# Patient Record
Sex: Female | Born: 1960 | Race: Black or African American | Hispanic: Refuse to answer | Marital: Single
Health system: Midwestern US, Community
[De-identification: ages and names within clinical notes are randomized; demographics above are authoritative.]

## PROBLEM LIST (undated history)

## (undated) DIAGNOSIS — M25522 Pain in left elbow: Secondary | ICD-10-CM

## (undated) DIAGNOSIS — M47812 Spondylosis without myelopathy or radiculopathy, cervical region: Secondary | ICD-10-CM

## (undated) DIAGNOSIS — E559 Vitamin D deficiency, unspecified: Secondary | ICD-10-CM

## (undated) DIAGNOSIS — M542 Cervicalgia: Secondary | ICD-10-CM

## (undated) DIAGNOSIS — R0789 Other chest pain: Secondary | ICD-10-CM

## (undated) DIAGNOSIS — K219 Gastro-esophageal reflux disease without esophagitis: Secondary | ICD-10-CM

## (undated) DIAGNOSIS — Z9889 Other specified postprocedural states: Secondary | ICD-10-CM

## (undated) DIAGNOSIS — R0600 Dyspnea, unspecified: Secondary | ICD-10-CM

## (undated) DIAGNOSIS — R918 Other nonspecific abnormal finding of lung field: Secondary | ICD-10-CM

## (undated) DIAGNOSIS — K259 Gastric ulcer, unspecified as acute or chronic, without hemorrhage or perforation: Secondary | ICD-10-CM

## (undated) DIAGNOSIS — I341 Nonrheumatic mitral (valve) prolapse: Secondary | ICD-10-CM

## (undated) DIAGNOSIS — K449 Diaphragmatic hernia without obstruction or gangrene: Secondary | ICD-10-CM

## (undated) DIAGNOSIS — J45909 Unspecified asthma, uncomplicated: Secondary | ICD-10-CM

## (undated) DIAGNOSIS — M1990 Unspecified osteoarthritis, unspecified site: Secondary | ICD-10-CM

## (undated) DIAGNOSIS — I10 Essential (primary) hypertension: Secondary | ICD-10-CM

## (undated) DIAGNOSIS — Z8719 Personal history of other diseases of the digestive system: Secondary | ICD-10-CM

## (undated) HISTORY — DX: Personal history of other diseases of the digestive system: Z87.19

## (undated) HISTORY — DX: Other specified postprocedural states: Z98.890

## (undated) HISTORY — DX: Gastric ulcer, unspecified as acute or chronic, without hemorrhage or perforation: K25.9

## (undated) HISTORY — DX: Unspecified osteoarthritis, unspecified site: M19.90

## (undated) HISTORY — PX: LUNG BIOPSY: SHX232

## (undated) HISTORY — PX: BREAST CYST ASPIRATION: SHX578

## (undated) HISTORY — DX: Gastro-esophageal reflux disease without esophagitis: K21.9

## (undated) HISTORY — DX: Diaphragmatic hernia without obstruction or gangrene: K44.9

## (undated) HISTORY — PX: KNEE SURGERY: SHX244

---

## 1980-01-05 HISTORY — PX: LAPAROSCOPIC TUBAL LIGATION: SUR803

## 1983-01-05 HISTORY — PX: OTHER SURGICAL HISTORY: SHX169

## 1988-01-05 HISTORY — PX: ESOPHAGOGASTRODUODENOSCOPY: SHX1529

## 2000-01-05 HISTORY — PX: LAPAROSCOPIC SUPRACERVICAL HYSTERECTOMY: SUR797

## 2000-01-05 HISTORY — PX: ABDOMINAL HYSTERECTOMY: SHX81

## 2007-01-05 HISTORY — PX: ANTERIOR FUSION CERVICAL SPINE: SUR626

## 2008-01-05 HISTORY — PX: KNEE ARTHROSCOPY: SUR90

## 2009-01-04 HISTORY — PX: SHOULDER SURGERY: SHX246

## 2011-01-05 HISTORY — PX: COLONOSCOPY: SHX174

## 2013-10-23 ENCOUNTER — Inpatient Hospital Stay: Admit: 2013-10-23 | Primary: Family Medicine

## 2013-10-23 DIAGNOSIS — Z0289 Encounter for other administrative examinations: Secondary | ICD-10-CM

## 2013-10-23 LAB — RUBELLA AB, IGG: Rubella IgG, QL: IMMUNE

## 2013-10-25 LAB — HEP B SURFACE AB: Hep B surface Ab Interp.: DETECTED — AB

## 2013-10-25 LAB — MUMPS AB, IGG: Mumps Abs, IgG: 71 AU/mL (ref >10.9)

## 2013-10-25 LAB — RUBEOLA AB, IGG: Rubeola Ab, IgG: 161 AU/mL (ref >29.9)

## 2013-10-25 LAB — VZV AB, IGG: V. zoster, IgG: 2877 index (ref >165)

## 2013-10-25 LAB — HEPATITIS B SURFACE ANTIBODY: Hep B S Ab Interp: DETECTED — AB

## 2013-11-12 DIAGNOSIS — J4520 Mild intermittent asthma, uncomplicated: Secondary | ICD-10-CM | POA: Insufficient documentation

## 2013-11-13 LAB — AMB EXT CREATININE: Creatinine, External: 1.03

## 2013-12-26 ENCOUNTER — Encounter

## 2013-12-26 ENCOUNTER — Inpatient Hospital Stay: Admit: 2013-12-26 | Payer: PRIVATE HEALTH INSURANCE | Primary: Family Medicine

## 2013-12-26 DIAGNOSIS — M47812 Spondylosis without myelopathy or radiculopathy, cervical region: Secondary | ICD-10-CM

## 2013-12-26 LAB — SED RATE (ESR): Sed rate, automated: 19 mm/hr (ref 0–30)

## 2014-02-07 ENCOUNTER — Encounter: Attending: Internal Medicine | Primary: Family Medicine

## 2014-02-20 ENCOUNTER — Ambulatory Visit
Admit: 2014-02-20 | Discharge: 2014-02-20 | Payer: PRIVATE HEALTH INSURANCE | Attending: Internal Medicine | Primary: Family Medicine

## 2014-02-20 ENCOUNTER — Ambulatory Visit: Admit: 2014-02-20 | Payer: PRIVATE HEALTH INSURANCE | Attending: Internal Medicine | Primary: Family Medicine

## 2014-02-20 DIAGNOSIS — R635 Abnormal weight gain: Secondary | ICD-10-CM

## 2014-02-20 MED ORDER — ACETAMINOPHEN-CODEINE 300 MG-60 MG TAB
300-60 mg | ORAL_TABLET | ORAL | Status: DC | PRN
Start: 2014-02-20 — End: 2014-02-21

## 2014-02-20 MED ORDER — GABAPENTIN ENACARBIL ER 300 MG TABLET,EXTENDED RELEASE
300 mg | ORAL_TABLET | Freq: Every day | ORAL | Status: DC
Start: 2014-02-20 — End: 2014-02-28

## 2014-02-20 MED ORDER — DIAZEPAM 10 MG TAB
10 mg | ORAL_TABLET | Freq: Four times a day (QID) | ORAL | Status: DC | PRN
Start: 2014-02-20 — End: 2014-03-07

## 2014-02-20 NOTE — Progress Notes (Unsigned)
Kelsey Bradley is a 54 y.o. female presents to office to establish care.

## 2014-02-20 NOTE — Progress Notes (Unsigned)
Patient switched to another provider's schedule.

## 2014-02-20 NOTE — Progress Notes (Signed)
Marlowe AltVanessa Lindseth is a 54 y.o. female presents in office to establish care.

## 2014-02-20 NOTE — Progress Notes (Signed)
HPI  Patient presents to establish care and to discuss a number of concerns.    Neck pain- pt reports a week history of worsening neck pains.  She gives a history of Torticollis in the past and is concerned that this may be recurring.  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  Patient reports a radiating pains to her upper extremities (b/l) with associated numbness and tingling in her fingers.  She describes her pain as debilitating.    Concern about her thyroid- pt reports excess weight gain of over 20 pounds within 5 months even with working out and dieting.  Patient also reports extensive hair loss and thinning to the point that she is now wearing a wig.  She reports little to no energy and dry skin.  She denies any difficulty swallowing but does note a change in her voice.  Patient requesting a repeat of her thyroid labs to include TPO antibodies.  Her last thyroid tests (TSH and free T4) a few months ago was normal.    Review of Systems:  ROS:  History obtained from the patient  ?? General: + abnormal weight gain  ?? HEENT: + hoarse voice  ?? Respiratory: no cough, shortness of breath, or wheezing  ?? Cardiovascular: no chest pain, palpitations, or dyspnea on exertion  ?? Gastrointestinal: no abdominal pain, N/V, change in bowel habits, or black or bloody stools  ?? Musculoskeletal: + neck pain  ?? Neurological: +numbness, tingling of the upper extremities  ?? Psychological: negative for - anxiety, depression, sleep disturbances, suicidal or homicidal ideations  ?? Skin- dry skin, hair loss    Past Medical History   Diagnosis Date   ??? Spondylosis    ??? Torticollis    ??? Chronic neck pain        Family History   Problem Relation Age of Onset   ??? Diabetes Mother    ??? Hypertension Mother    ??? Cancer Father      prostate   ??? Hypertension Father        Past Surgical History   Procedure Laterality Date    ??? Hx total abdominal hysterectomy     ??? Hx knee arthroscopy       x4   ??? Hx rotator cuff repair       x2   ??? Hx cervical fusion         History     Social History   ??? Marital Status: SINGLE     Spouse Name: N/A   ??? Number of Children: N/A   ??? Years of Education: N/A     Occupational History   ??? Not on file.     Social History Main Topics   ??? Smoking status: Former Smoker     Quit date: 02/21/1983   ??? Smokeless tobacco: Never Used   ??? Alcohol Use: No   ??? Drug Use: No   ??? Sexual Activity: Not Currently     Other Topics Concern   ??? Not on file     Social History Narrative       Current Outpatient Prescriptions   Medication Sig Dispense Refill   ??? estradiol (ESTRACE) 2 mg tablet Take 2 mg by mouth daily.     ??? estradiol (VAGIFEM) 10 mcg tab vaginal tablet Insert 10 mcg into vagina. Three times weekly.     ??? multivitamin (ONE A DAY)  tablet Take 1 Tab by mouth daily.     ??? BIOTIN PO Take 5,000 mg by mouth daily.     ??? GLUCOSAMINE-CONDROITIN-HRB#182 PO Take 1 Tab by mouth daily.     ??? gabapentin enacarbil (HORIZANT) 300 mg TbER Take 300 mg by mouth daily. 30 Tab 0   ??? diazepam (VALIUM) 10 mg tablet Take 1 Tab by mouth every six (6) hours as needed for Anxiety. Max Daily Amount: 40 mg. 5 Tab 0   ??? oxyCODONE-acetaminophen (PERCOCET) 5-325 mg per tablet Take 1 Tab by mouth every four (4) hours as needed for Pain. Max Daily Amount: 6 Tabs. 30 Tab 0       Allergies   Allergen Reactions   ??? Latex Rash   ??? Shellfish Derived Anaphylaxis   ??? Anaprox [Naproxen Sodium] Other (comments)     GI Bleeding   ??? Celebrex [Celecoxib] Other (comments)     GI Bleeding   ??? Doxycycline Unknown (comments)   ??? Motrin [Ibuprofen] Other (comments)     GI Bleeding   ??? Naprosyn [Naproxen] Other (comments)     GI Bleeding   ??? Neurontin [Gabapentin] Unknown (comments)   ??? Norco [Hydrocodone-Acetaminophen] Other (comments)     Hair Sheds   ??? Nsaids (Non-Steroidal Anti-Inflammatory Drug) Other (comments)     Gastric bleed    ??? Singulair [Montelukast] Other (comments)     Caused Fainting   ??? Strawberry Hives and Nausea and Vomiting   ??? Sulfamethoxazole-Trimethoprim Other (comments)     Increases pain   ??? Tramadol Other (comments)     confusion       Physical Examination:   BP 124/82 mmHg   Pulse 65   Temp(Src) 96.7 ??F (35.9 ??C) (Oral)   Resp 12   Ht 5' 1.25" (1.556 m)   Wt 169 lb 12.8 oz (77.021 kg)   BMI 31.81 kg/m2   SpO2 98%    General appearance - alert, well appearing, and in no distress  Mental status - alert, oriented to person, place, and time  PULM: clear to auscultation, no wheezes, rales or rhonchi, symmetric air entry  CVS - normal rate, regular rhythm, normal S1, S2, no murmurs, rubs, clicks or gallops  Neurological - alert, oriented, normal speech, no focal findings or movement disorder noted, cranial nerves II through XII intact  Musculoskeletal - there is tenderness to palpation of the cervical area, decrease grip strength on the right.  +5/5 strength in the lower extremities b/l.  Of note her left upper extremity is larger than her right.     Imaging:    MRI cervical 11/28/13.  Impression:  1.  Evidence of ACDF with likely solid fusion C6-C7.  No recurrent or residual significant central or foraminal stenosis at this level.  2.  Mild degenerative change at the level above the fusion at C5-C6.  No significant central or foraminal stenosis at this level.  3.  Asymmetric more prominent on the right facet arthropathy at C4-C5 with an asymmetrically prominent joint effusion on the right in the facet joint at this level.  Mild adjacent marrow but no prominent soft tissue changes.  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  4.  Prespinal/retropharyngeal edema/fluid, presumably reactive and nonspecific.     Result Narrative   Cervical spine MR:    Indication: Severe neck pain.  History of prior surgery.    Procedure: Sagittal spin echo T1, T2 TSE and TSE inversion recovery (STIR)  images of the  cervical spine.  Axial 3D TSE T2 volume SPACE and  T2 star gradient echo thin section scans were also performed.  Comparison previous plain film 11/28/2013.    Findings: There is straightening of the cervical lordosis.  Anterior listhesis of C3 on C4 and C4 on C5 is noted .  Evidence of ACDF C6-C7.  Apparent likely solid fusion.  No destructive vertebral lesions.  There is asymmetrically prominent facet arthropathy on the right at the C4-C5 level with a somewhat prominent joint effusion there is no prominent adjacent marrow changes.  There is mild nonspecific retroperitoneal/prespinal fluid/edema, likely reactive, asymmetric more prominent on the right.  No cord lesion.  The craniocervical junction is normal.    Axial scans show:    C2/C3: Normal except for mild degenerative change.    C3/C4: Diffuse spondylosis and right greater than left facet arthropathy.  Moderate right-sided foraminal stenosis.  Mild central stenosis.  Prespinal/retropharyngeal fluid/edema.    C4/C5: Diffuse spondylosis/bulging disc with left greater than right facet arthropathy.  Mild central stenosis with very slight cord flattening.  No cord signal change.  Thecal sac measures 9.5 mm.  Relatively severe left and high-grade moderate-severe right-sided foraminal stenosis.  Facet arthropathy with marrow edema associated with the asymmetrically more prominent on the right facet degenerative change at this level.    C5/C6: Mild spondylosis.  No significant central or foraminal stenosis.    C6/C7: Surgical level.  No recurrent or residual central or foraminal stenosis.    C7/T1: Normal except for mild degenerative change.       Asessment/ Plan    1. Abnormal weight gain   TSH, 3RD GENERATION; Future  - T4, FREE; Future  - CORTISOL; Future  - THYROID PEROXIDASE (TPO) AB; Future  - T3, FREE; Future    2. Cervical pain (neck)  - MRI showing mod to severe foraminal stenosis with multiple areas of arthropathy  - REFERRAL TO NEUROSURGERY   - gabapentin enacarbil (HORIZANT) 300 mg TbER; Take 300 mg by mouth daily.  Dispense: 30 Tab; Refill: 0  - diazepam (VALIUM) 10 mg tablet for muscle relaxant as the patient does have muscle spasms  - will await until clearance from neurosurgery for Physical therapy  - tylenol #4 for pain control    3. Hair loss- unclear etiology  - ANTINUCLEAR ANTIBODIES, IFA; Future    4. Fatigue, unspecified type  - ANTINUCLEAR ANTIBODIES, IFA; Future  - METABOLIC PANEL, COMPREHENSIVE; Future  - VITAMIN D, 25 HYDROXY; Future  - THYROID PEROXIDASE (TPO) AB; Future  - T3, FREE; Future      Plan of care has been discussed with the patient and she is in agreement  Dyann Ruddle, MD

## 2014-02-21 MED ORDER — OXYCODONE-ACETAMINOPHEN 5 MG-325 MG TAB
5-325 mg | ORAL_TABLET | ORAL | Status: DC | PRN
Start: 2014-02-21 — End: 2014-03-07

## 2014-02-21 NOTE — Telephone Encounter (Signed)
Dr. Aneta [facility]insPhillip,      [facility] the gabapentin is non formulary for medicare part-d and will not be covered even with a PA.       Please advise,  Dixon BoosMary Dayanghirang LPN

## 2014-02-21 NOTE — Telephone Encounter (Addendum)
Received incoming call from patient reporting that she had been itching since she took the Tylenol #4 and she reports her appetite is "off the roof". PT request Percocet instead. Also, the Horizant pt said cost $400 and she cannot afford it. She spoke with her Spine specialist and they are sending us pt's notes.

## 2014-02-22 DIAGNOSIS — M542 Cervicalgia: Secondary | ICD-10-CM | POA: Insufficient documentation

## 2014-02-22 DIAGNOSIS — L659 Nonscarring hair loss, unspecified: Secondary | ICD-10-CM | POA: Insufficient documentation

## 2014-02-22 DIAGNOSIS — M62838 Other muscle spasm: Secondary | ICD-10-CM | POA: Insufficient documentation

## 2014-02-22 DIAGNOSIS — R5383 Other fatigue: Secondary | ICD-10-CM | POA: Insufficient documentation

## 2014-02-22 DIAGNOSIS — R635 Abnormal weight gain: Secondary | ICD-10-CM | POA: Insufficient documentation

## 2014-02-22 NOTE — Patient Instructions (Signed)
Neck Pain: Care Instructions  Your Care Instructions  You can have neck pain anywhere from the bottom of your head to the top of your shoulders. It can spread to the upper back or arms. Injuries, painting a ceiling, sleeping with your neck twisted, staying in one position for too long, and many other activities can cause neck pain.  Most neck pain gets better with home care. Your doctor may recommend medicine to relieve pain or relax your muscles. He or she may suggest exercise and physical therapy to increase flexibility and relieve stress. You may need to wear a special (cervical) collar to support your neck for a day or two.  Follow-up care is a key part of your treatment and safety. Be sure to make and go to all appointments, and call your doctor if you are having problems. It???s also a good idea to know your test results and keep a list of the medicines you take.  How can you care for yourself at home?  ?? Try using a heating pad on a low or medium setting for 15 to 20 minutes every 2 or 3 hours. Try a warm shower in place of one session with the heating pad.  ?? You can also try an ice pack for 10 to 15 minutes every 2 to 3 hours. Put a thin cloth between the ice and your skin.  ?? Take pain medicines exactly as directed.  ?? If the doctor gave you a prescription medicine for pain, take it as prescribed.  ?? If you are not taking a prescription pain medicine, ask your doctor if you can take an over-the-counter medicine.  ?? If your doctor recommends a cervical collar, wear it exactly as directed.  ?? Avoid things that might increase swelling, such as hot showers, hot tubs, or hot packs, for the first 2 days after an injury.  When should you call for help?  Call your doctor now or seek immediate medical care if:  ?? You have new or worsening numbness in your arms, buttocks or legs.  ?? You have new or worsening weakness in your arms or legs. (This could make it hard to stand up.)   ?? You lose control of your bladder or bowels.  Watch closely for changes in your health, and be sure to contact your doctor if:  ?? Your neck pain is getting worse.  ?? You are not getting better after 1 week.  ?? You do not get better as expected.   Where can you learn more?   Go to http://www.healthwise.net/[facility]  Enter V723 in the search box to learn more about "Neck Pain: Care Instructions."   ?? 2006-2015 Healthwise[employer]. Care instructions adapted under license by [employer] (which disclaims liability or warranty for this information). This care instruction is for use with your licensed healthcare professional. If you have questions about a medical condition or this instruction, always ask your healthcare professional. Healthwise, Incorporated disclaims any warranty or liability for your use of this information.  Content Version: 10.7.482551; Current as of: November 17, 2012

## 2014-02-23 ENCOUNTER — Inpatient Hospital Stay: Admit: 2014-02-23 | Payer: PRIVATE HEALTH INSURANCE | Primary: Family Medicine

## 2014-02-23 DIAGNOSIS — R635 Abnormal weight gain: Secondary | ICD-10-CM

## 2014-02-23 LAB — METABOLIC PANEL, COMPREHENSIVE
A-G Ratio: 1.3 (ref 0.8–1.7)
ALT (SGPT): 26 U/L (ref 13–56)
AST (SGOT): 21 U/L (ref 15–37)
Albumin: 4.1 g/dL (ref 3.4–5.0)
Alk. phosphatase: 84 U/L (ref 45–117)
Anion gap: 5 mmol/L (ref 3.0–18)
BUN/Creatinine ratio: 12 (ref 12–20)
BUN: 9 MG/DL (ref 7.0–18)
Bilirubin, total: 0.5 MG/DL (ref 0.2–1.0)
CO2: 28 mmol/L (ref 21–32)
Calcium: 9.1 MG/DL (ref 8.5–10.1)
Chloride: 105 mmol/L (ref 100–108)
Creatinine: 0.78 MG/DL (ref 0.6–1.3)
GFR est AA: >60 mL/min/{1.73_m2} (ref >60)
GFR est non-AA: >60 mL/min/{1.73_m2} (ref >60)
Globulin: 3.2 g/dL (ref 2.0–4.0)
Glucose: 79 mg/dL (ref 74–99)
Potassium: 4.9 mmol/L (ref 3.5–5.5)
Protein, total: 7.3 g/dL (ref 6.4–8.2)
Sodium: 138 mmol/L (ref 136–145)

## 2014-02-23 LAB — T4, FREE: T4, Free: 1 NG/DL (ref 0.7–1.5)

## 2014-02-23 LAB — TSH 3RD GENERATION: TSH: 1.02 u[IU]/mL (ref 0.36–3.74)

## 2014-02-23 LAB — SED RATE (ESR): Sed rate, automated: 9 mm/hr (ref 0–30)

## 2014-02-23 LAB — T3, FREE: Triiodothyronine (T3), free: 2.2 PG/ML — ABNORMAL LOW (ref 2.3–4.2)

## 2014-02-24 LAB — CORTISOL: Cortisol, random: 10 ug/dL (ref 3.09–22.40)

## 2014-02-24 LAB — VITAMIN D, 25 HYDROXY: Vitamin D 25-Hydroxy: 14.9 ng/mL — ABNORMAL LOW (ref 30–100)

## 2014-02-26 LAB — THYROID PEROXIDASE (TPO) AB: Thyroid peroxidase Ab: 12 IU/mL (ref 0–34)

## 2014-02-26 LAB — ANTINUCLEAR ANTIBODIES, IFA: Antinuclear Abs, IFA: NEGATIVE

## 2014-02-27 ENCOUNTER — Ambulatory Visit
Admit: 2014-02-27 | Discharge: 2014-02-27 | Payer: PRIVATE HEALTH INSURANCE | Attending: Internal Medicine | Primary: Family Medicine

## 2014-02-27 DIAGNOSIS — L659 Nonscarring hair loss, unspecified: Secondary | ICD-10-CM

## 2014-02-27 MED ORDER — ERGOCALCIFEROL (VITAMIN D2) 50,000 UNIT CAP
1250 mcg (50,000 unit) | ORAL_CAPSULE | ORAL | Status: DC
Start: 2014-02-27 — End: 2014-05-11

## 2014-02-27 NOTE — Progress Notes (Signed)
Marlowe AltVanessa Bradley is a 54 y.o. female presents to office for follow up lab results.     1. Have you been to the ER, urgent care clinic or hospitalized since your last visit? No  2. [facility] other [facility] since your last visit? No

## 2014-02-27 NOTE — Progress Notes (Signed)
HPI  Patient presents to establish care and to discuss a number of concerns.    Neck pain- percocets she reports decreases her pain to a 4-5/10.  She reports some drowsiness from the meds.      Swollen lips- pt reports a sudden onset of her lips and facial swelling.  She reports that it resolved after 3 days with the use of benadryl.  Patient reported that this first began in December and that it has occurred at least two times since then.       Review of Systems:  ROS:  History obtained from the patient  ?? General: + abnormal weight gain  ?? HEENT: + swollen lips  ?? Respiratory: no cough, shortness of breath, or wheezing  ?? Cardiovascular: no chest pain, palpitations, or dyspnea on exertion  ?? Gastrointestinal: no abdominal pain, N/V, change in bowel habits, or black or bloody stools  ?? Musculoskeletal: + neck pain  ?? Neurological: +numbness, tingling of the upper extremities  ?? Psychological: negative for - anxiety, depression, sleep disturbances, suicidal or homicidal ideations  ?? Skin- dry skin, hair loss    Past Medical History   Diagnosis Date   ??? Spondylosis    ??? Torticollis    ??? Chronic neck pain        Family History   Problem Relation Age of Onset   ??? Diabetes Mother    ??? Hypertension Mother    ??? Cancer Father      prostate   ??? Hypertension Father        Past Surgical History   Procedure Laterality Date   ??? Hx total abdominal hysterectomy     ??? Hx knee arthroscopy       x4   ??? Hx rotator cuff repair       x2   ??? Hx cervical fusion         History     Social History   ??? Marital Status: SINGLE     Spouse Name: N/A   ??? Number of Children: N/A   ??? Years of Education: N/A     Occupational History   ??? Not on file.     Social History Main Topics   ??? Smoking status: Former Smoker     Quit date: 02/21/1983   ??? Smokeless tobacco: Never Used   ??? Alcohol Use: No   ??? Drug Use: No   ??? Sexual Activity: Not Currently     Other Topics Concern   ??? Not on file     Social History Narrative        Current Outpatient Prescriptions   Medication Sig Dispense Refill   ??? ergocalciferol (VITAMIN D2) 50,000 unit capsule Take 1 Cap by mouth every seven (7) days. 6 Cap 1   ??? oxyCODONE-acetaminophen (PERCOCET) 5-325 mg per tablet Take 1 Tab by mouth every four (4) hours as needed for Pain. Max Daily Amount: 6 Tabs. 30 Tab 0   ??? estradiol (ESTRACE) 2 mg tablet Take 2 mg by mouth daily.     ??? estradiol (VAGIFEM) 10 mcg tab vaginal tablet Insert 10 mcg into vagina. Three times weekly.     ??? multivitamin (ONE A DAY) tablet Take 1 Tab by mouth daily.     ??? BIOTIN PO Take 5,000 mg by mouth daily.     ??? GLUCOSAMINE-CONDROITIN-HRB#182 PO Take 1 Tab by mouth daily.     ??? diazepam (VALIUM) 10 mg tablet Take 1 Tab by mouth every six (  6) hours as needed for Anxiety. Max Daily Amount: 40 mg. 5 Tab 0       Allergies   Allergen Reactions   ??? Latex Rash   ??? Shellfish Derived Anaphylaxis   ??? Anaprox [Naproxen Sodium] Other (comments)     GI Bleeding   ??? Celebrex [Celecoxib] Other (comments)     GI Bleeding   ??? Doxycycline Unknown (comments)   ??? Motrin [Ibuprofen] Other (comments)     GI Bleeding   ??? Naprosyn [Naproxen] Other (comments)     GI Bleeding   ??? Neurontin [Gabapentin] Unknown (comments)   ??? Norco [Hydrocodone-Acetaminophen] Other (comments)     Hair Sheds   ??? Nsaids (Non-Steroidal Anti-Inflammatory Drug) Other (comments)     Gastric bleed   ??? Singulair [Montelukast] Other (comments)     Caused Fainting   ??? Strawberry Hives and Nausea and Vomiting   ??? Sulfamethoxazole-Trimethoprim Other (comments)     Increases pain   ??? Tramadol Other (comments)     confusion       Physical Examination:   BP 140/80 mmHg   Pulse 70   Temp(Src) 96.6 ??F (35.9 ??C) (Oral)   Resp 12   Ht 5' 1.25" (1.556 m)   Wt 167 lb 9.6 oz (76.023 kg)   BMI 31.40 kg/m2   SpO2 99%    General appearance - alert, well appearing, and in no distress  Mental status - alert, oriented to person, place, and time   PULM: clear to auscultation, no wheezes, rales or rhonchi, symmetric air entry  CVS - normal rate, regular rhythm, normal S1, S2, no murmurs, rubs, clicks or gallops  Musculoskeletal - there is tenderness to palpation of the cervical area, decrease grip strength on the right.  +5/5 strength in the lower extremities b/l.        Asessment/ Plan  1. Hair loss, abnormal weight gain and other symptoms  - REFERRAL TO ENDOCRINOLOGY    2. Vitamin D deficiency  - ergocalciferol (VITAMIN D2) 50,000 unit capsule; Take 1 Cap by mouth every seven (7) days.  Dispense: 6 Cap; Refill: 1    3. Chronic neck and back pains- improved control of pain  - awaiting neurosurgery appointment  - continue current plan of care      Plan of care has been discussed with the patient and she is in agreement  Dyann Ruddlearik A Docie Abramovich, MD

## 2014-03-04 ENCOUNTER — Encounter

## 2014-03-04 NOTE — Telephone Encounter (Signed)
Pt calling to get refill on:  oxyCODONE-acetaminophen (PERCOCET) 5-325 mg per tablet  diazepam (VALIUM) 10 mg tablet   Pt is out of medication.   Pt is aware of 72 hr time frame and that she will need to pick up meds once they are approved.

## 2014-03-04 NOTE — Telephone Encounter (Signed)
Dr. Aneta MinsPhillip, please advise on below, does pt need an appt? Thank you.

## 2014-03-06 NOTE — Telephone Encounter (Signed)
Pt calling again, please advise

## 2014-03-07 NOTE — Telephone Encounter (Signed)
Dr Aneta MinsPhillip,    Please advise.   Requested Prescriptions     Pending Prescriptions Disp Refills   ??? diazepam (VALIUM) 10 mg tablet 5 Tab 0     Sig: Take 1 Tab by mouth every six (6) hours as needed for Anxiety. Max Daily Amount: 40 mg.   ??? oxyCODONE-acetaminophen (PERCOCET) 5-325 mg per tablet 30 Tab 0     Sig: Take 1 Tab by mouth every four (4) hours as needed for Pain. Max Daily Amount: 6 Tabs.         Thanks

## 2014-03-08 MED ORDER — DIAZEPAM 10 MG TAB
10 mg | ORAL_TABLET | Freq: Four times a day (QID) | ORAL | Status: DC | PRN
Start: 2014-03-08 — End: 2014-04-11

## 2014-03-08 MED ORDER — OXYCODONE-ACETAMINOPHEN 5 MG-325 MG TAB
5-325 mg | ORAL_TABLET | ORAL | Status: DC | PRN
Start: 2014-03-08 — End: 2014-04-11

## 2014-03-21 NOTE — Telephone Encounter (Signed)
Pt is requesting an order for mammogram. Pt is having pain in her left breast and under her left arm. Please advise.

## 2014-03-22 NOTE — Telephone Encounter (Signed)
Dr. Phillip,     Please be advised.

## 2014-03-23 NOTE — Telephone Encounter (Signed)
[facility] the patient [facility]  Patient aware that she will have to make an appointment concerning her breast pain.  She reports that her insurance does not kick in until April.  I have asked her to make an appointment as soon as her Insurance becomes active.  Patient has indicated that she does not have the money for a mammogram right now.

## 2014-03-23 NOTE — Telephone Encounter (Signed)
Pt calling to return Dr. Berton LanPhillip's call. Please advise.

## 2014-03-28 NOTE — Telephone Encounter (Signed)
Patient calling in again regarding mammogram order. Reiterated to her that Dr. Aneta MinsPhillip wants her to come in to be evaluated to know what type of mammogram would be appropriate to find cause of her pain. Patient verbalized understanding and an appointment was made for her for 04/09/2014 at 445pm.   Closing encounter.

## 2014-04-08 ENCOUNTER — Encounter: Attending: Internal Medicine | Primary: Family Medicine

## 2014-04-09 ENCOUNTER — Ambulatory Visit
Admit: 2014-04-09 | Discharge: 2014-04-09 | Payer: PRIVATE HEALTH INSURANCE | Attending: Family Medicine | Primary: Family Medicine

## 2014-04-09 ENCOUNTER — Encounter: Attending: Internal Medicine | Primary: Family Medicine

## 2014-04-09 DIAGNOSIS — M542 Cervicalgia: Secondary | ICD-10-CM

## 2014-04-09 NOTE — Progress Notes (Signed)
Marlowe AltVanessa Bradley is a 54 y.o. female here today to discuss bilateral breast pain. Patient stated that her left breast hurts worse than the right and that her left armpit is beginning to hurt as well.

## 2014-04-09 NOTE — Progress Notes (Signed)
Kelsey Bradley is a 54 y.o. African American female and presents with acute bilateral breast pain left greater than right   Especially around the lower end of the areolas. She denies any palpable masses on both sides. She has not had a mammogram in 4 years.  She does  See a GYN provider.     Subjective:    Additional Concerns: none    Patient Active Problem List    Diagnosis Date Noted   ??? Abnormal weight gain 02/22/2014   ??? Hair loss 02/22/2014   ??? Fatigue 02/22/2014   ??? Cervical pain (neck) 02/22/2014   ??? Muscle spasm 02/22/2014     Current Outpatient Prescriptions   Medication Sig Dispense Refill   ??? diazepam (VALIUM) 10 mg tablet Take 1 Tab by mouth every six (6) hours as needed for Anxiety. Max Daily Amount: 40 mg. 5 Tab 0   ??? oxyCODONE-acetaminophen (PERCOCET) 5-325 mg per tablet Take 1 Tab by mouth every four (4) hours as needed for Pain. Max Daily Amount: 6 Tabs. 30 Tab 0   ??? ergocalciferol (VITAMIN D2) 50,000 unit capsule Take 1 Cap by mouth every seven (7) days. 6 Cap 1   ??? estradiol (ESTRACE) 2 mg tablet Take 2 mg by mouth daily.     ??? estradiol (VAGIFEM) 10 mcg tab vaginal tablet Insert 10 mcg into vagina. Three times weekly.     ??? multivitamin (ONE A DAY) tablet Take 1 Tab by mouth daily.     ??? BIOTIN PO Take 5,000 mg by mouth daily.     ??? GLUCOSAMINE-CONDROITIN-HRB#182 PO Take 1 Tab by mouth daily.       Allergies   Allergen Reactions   ??? Latex Rash   ??? Shellfish Derived Anaphylaxis   ??? Anaprox [Naproxen Sodium] Other (comments)     GI Bleeding   ??? Celebrex [Celecoxib] Other (comments)     GI Bleeding   ??? Doxycycline Unknown (comments)   ??? Motrin [Ibuprofen] Other (comments)     GI Bleeding   ??? Naprosyn [Naproxen] Other (comments)     GI Bleeding   ??? Neurontin [Gabapentin] Unknown (comments)   ??? Norco [Hydrocodone-Acetaminophen] Other (comments)     Hair Sheds   ??? Nsaids (Non-Steroidal Anti-Inflammatory Drug) Other (comments)     Gastric bleed   ??? Singulair [Montelukast] Other (comments)      Caused Fainting   ??? Strawberry Hives and Nausea and Vomiting   ??? Sulfamethoxazole-Trimethoprim Other (comments)     Increases pain   ??? Tramadol Other (comments)     confusion     Past Medical History   Diagnosis Date   ??? Spondylosis    ??? Torticollis    ??? Chronic neck pain      Past Surgical History   Procedure Laterality Date   ??? Hx total abdominal hysterectomy     ??? Hx knee arthroscopy       x4   ??? Hx rotator cuff repair       x2   ??? Hx cervical fusion       Family History   Problem Relation Age of Onset   ??? Diabetes Mother    ??? Hypertension Mother    ??? Cancer Father      prostate   ??? Hypertension Father      History   Substance Use Topics   ??? Smoking status: Former Smoker     Quit date: 02/21/1983   ??? Smokeless tobacco: Never Used   ???  Alcohol Use: No     ROS     General: negative for - chills, fatigue, fever, weight change  Resp: negative for - cough, shortness of breath or wheezing  CV: negative for - chest pain, edema or palpitations  Breast - left breast slightly appears larger than other. Palpable pain on palpation at the 5-6 oclock position of the areola  But no definite palpable mass. Whatever is palpable felt like dense breast tissue or fat tissue.     Objective:    PE    Alert, well appearing, and in no distress, oriented to person, place, and time and overweight  Mental status - alert, oriented to person, place, and time, normal mood, behavior, speech, dress, motor activity, and thought processes  Chest - clear to auscultation, no wheezes, rales or rhonchi, symmetric air entry  Heart - normal rate, regular rhythm, normal S1, S2, no murmurs, rubs, clicks or gallops  Breasts - right breast normal without mass, skin or nipple changes or axillary nodes, left breast normal without mass, skin or nipple changes or axillary nodes  But has tender areas at the 5-6 oclock position just beneath both areolas. No nipple discharge on squeezing bilateral.     [facility] Outpatient Visit on 02/23/2014    Component Date Value Ref Range Status   ??? TSH 02/23/2014 1.02  0.36 - 3.74 uIU/mL Final   ??? T4, Free 02/23/2014 1.0  0.7 - 1.5 NG/DL Final   ??? Sed rate, automated 02/23/2014 9  0 - 30 mm/hr Final   ??? Sodium 02/23/2014 138  136 - 145 mmol/L Final   ??? Potassium 02/23/2014 4.9  3.5 - 5.5 mmol/L Final   ??? Chloride 02/23/2014 105  100 - 108 mmol/L Final   ??? CO2 02/23/2014 28  21 - 32 mmol/L Final   ??? Anion gap 02/23/2014 5  3.0 - 18 mmol/L Final   ??? Glucose 02/23/2014 79  74 - 99 mg/dL Final   ??? BUN 02/23/2014 9  7.0 - 18 MG/DL Final   ??? Creatinine 02/23/2014 0.78  0.6 - 1.3 MG/DL Final   ??? BUN/Creatinine ratio 02/23/2014 12  12 - 20   Final   ??? GFR est AA 02/23/2014 >60  >60 ml/min/1.11m Final   ??? GFR est non-AA 02/23/2014 >60  >60 ml/min/1.751mFinal    Comment: (NOTE)  Estimated GFR is calculated using the Modification of Diet in Renal   Disease (MDRD) Study equation, reported for both African Americans   (GFRAA) and non-African Americans (GFRNA), and normalized to 1.7319m body surface area. The physician must decide which value applies to   the patient. The MDRD study equation should only be used in   individuals age 28 63 older. It has not been validated for the   following: pregnant women, patients with serious comorbid conditions,   or on certain medications, or persons with extremes of body size,   muscle mass, or nutritional status.     ??? Calcium 02/23/2014 9.1  8.5 - 10.1 MG/DL Final   ??? Bilirubin, total 02/23/2014 0.5  0.2 - 1.0 MG/DL Final   ??? ALT 02/23/2014 26  13 - 56 U/L Final   ??? AST 02/23/2014 21  15 - 37 U/L Final   ??? Alk. phosphatase 02/23/2014 84  45 - 117 U/L Final   ??? Protein, total 02/23/2014 7.3  6.4 - 8.2 g/dL Final   ??? Albumin 02/23/2014 4.1  3.4 - 5.0 g/dL Final   ???  Globulin 02/23/2014 3.2  2.0 - 4.0 g/dL Final   ??? A-G Ratio 02/23/2014 1.3  0.8 - 1.7   Final   ??? Vitamin D 25-Hydroxy 02/23/2014 14.9* 30 - 100 ng/mL Final    Comment: New method in use, please reestablish patient baseline   (NOTE)  Deficiency               <20 ng/mL  Insufficiency          20-30 ng/mL  Sufficient            30-100 ng/mL  Possible toxicity       >100 ng/mL     ??? Cortisol, random 02/23/2014 10.0  3.09 - 22.40 ug/dL Final   ??? Thyroid peroxidase Ab 02/23/2014 12  0 - 34 IU/mL Final    Comment: (NOTE)  Performed At: [facility]  [place], [place] [id number]  Lindon Romp MD [id number]     ??? Triiodothyronine (T3), free 02/23/2014 2.2* 2.3 - 4.2 PG/ML Final   ??? Antinuclear Abs, IFA 02/23/2014 NEGATIVE    Final    Comment: (NOTE)                                      Negative   <1:80                                      Borderline  1:80                                      Positive   >1:80  Performed At: [facility]  [place], [place] [id number]  [place] YK:[id number]     Hospital Outpatient Visit on 12/26/2013   Component Date Value Ref Range Status   ??? Sed rate, automated 12/26/2013 19  0 - 30 mm/hr Final   Hospital Outpatient Visit on 10/23/2013   Component Date Value Ref Range Status   ??? Hep B surface Ab Interp. 10/23/2013 DETECTED* POS   Final    Comment: Testing Performed by:  [facility] [place]  [id number]     ??? Hep B surface Ab comment 10/23/2013 Samples with a  value of 10 mIU/mL or greater are considered positive (protective immunity) in accordance with the CDC guidelines.   Final   ??? Mumps Abs, IgG 10/23/2013 71.0  Immune >10.9 AU/mL Final    Comment: DEMOGRAPHIC UPDATE OCCURRED ON 10/22 AT 1922, QA FLAGS AND RANGES MAY NO LONGER BE VALID  (NOTE)                                 Negative         <9.0                                 Equivocal  9.0 - 10.9                                 Positive        >  10.9  A positive result generally indicates past exposure to  Mumps virus or previous vaccination.  Performed At: [facility]  [place], [place] [id number]  Lindon Romp MD [id number]      ??? Rubella, IgG Ql. 10/23/2013 IMMUNE  IMM   Final   ??? Rubeola Ab, IgG 10/23/2013 161.0  Immune >29.9 AU/mL Final    Comment: DEMOGRAPHIC UPDATE OCCURRED ON 10/22 AT 1922, QA FLAGS AND RANGES MAY NO LONGER BE VALID  (NOTE)                                  Negative        <25.0                                  Equivocal 25.0 - 29.9                                  Positive        >29.9  Presence of antibodies to Rubeola is presumptive evidence  of immunity except when acute infection is suspected.  Performed At: [facility]  [place], [place] [id number]  Lindon Romp MD YC:[id number]     ??? V. zoster, IgG 10/23/2013 2877  Immune >165 index Final    Comment: DEMOGRAPHIC UPDATE OCCURRED ON 10/22 AT 1922, QA FLAGS AND RANGES MAY NO LONGER BE VALID  (NOTE)                                Negative          <135                                Equivocal    135 - 165                                Positive          >165  A positive result generally indicates exposure to the  pathogen or administration of specific immunoglobulins,  but it is not indication of active infection or stage  of disease.  Performed At: [facility]  [place], [place] [id number]  Lindon Romp MD [id number]         TESTS  Results for orders placed or performed during the hospital encounter of 02/23/14   TSH, 3RD GENERATION   Result Value Ref Range    TSH 1.02 0.36 - 3.74 uIU/mL   T4, FREE   Result Value Ref Range    T4, Free 1.0 0.7 - 1.5 NG/DL   SED RATE (ESR)   Result Value Ref Range    Sed rate, automated 9 0 - 30 mm/hr   METABOLIC PANEL, COMPREHENSIVE   Result Value Ref Range    Sodium 138 136 - 145 mmol/L    Potassium 4.9 3.5 - 5.5 mmol/L    Chloride 105 100 - 108 mmol/L    CO2 28 21 - 32 mmol/L    Anion gap 5 3.0 - 18 mmol/L    Glucose 79 74 -  99 mg/dL    BUN 9 7.0 - 18 MG/DL    Creatinine 0.78 0.6 - 1.3 MG/DL    BUN/Creatinine ratio 12 12 - 20      GFR est AA >60 >60 ml/min/1.55m     GFR est non-AA >60 >60 ml/min/1.742m   Calcium 9.1 8.5 - 10.1 MG/DL    Bilirubin, total 0.5 0.2 - 1.0 MG/DL    ALT 26 13 - 56 U/L    AST 21 15 - 37 U/L    Alk. phosphatase 84 45 - 117 U/L    Protein, total 7.3 6.4 - 8.2 g/dL    Albumin 4.1 3.4 - 5.0 g/dL    Globulin 3.2 2.0 - 4.0 g/dL    A-G Ratio 1.3 0.8 - 1.7     VITAMIN D, 25 HYDROXY   Result Value Ref Range    Vitamin D 25-Hydroxy 14.9 (L) 30 - 100 ng/mL   CORTISOL   Result Value Ref Range    Cortisol, random 10.0 3.09 - 22.40 ug/dL   THYROID PEROXIDASE (TPO) AB   Result Value Ref Range    Thyroid peroxidase Ab 12 0 - 34 IU/mL   T3, FREE   Result Value Ref Range    Triiodothyronine (T3), free 2.2 (L) 2.3 - 4.2 PG/ML   ANTINUCLEAR ANTIBODIES, IFA   Result Value Ref Range    Antinuclear Abs, IFA NEGATIVE        Assessment/Plan:      Acute bilateral breast tenderness but highly localized - Diagnostic mammogram ordered with further plan depending on the results. No rx pain meds   Indicated at this time.     Lab review: no lab studies available for review at time of visit.     I have discussed the diagnosis with the patient and the intended plan as seen in the above orders.  The patient has received an after-visit summary and questions were answered concerning future plans.  I have discussed medication side effects and warnings with the patient as well.I have reviewed the plan of care with the patient, accepted their input and they are in agreement with the treatment goals.       NEShelbie AmmonsMD

## 2014-04-09 NOTE — Patient Instructions (Signed)
Breast Pain: Care Instructions  Your Care Instructions  Breast tenderness and pain may come and go with your monthly periods (cyclic), or it may not follow any pattern (noncyclic). Breast pain is rarely caused by a serious health problem. You may need tests to find the cause.  Follow-up care is a key part of your treatment and safety. Be sure to make and go to all appointments, and call your doctor if you are having problems. It???s also a good idea to know your test results and keep a list of the medicines you take.  How can you care for yourself at home?  ?? If your doctor gave you medicine, take it exactly as prescribed. Call your doctor if you think you are having a problem with your medicine.  ?? Take an over-the-counter pain medicine, such as acetaminophen (Tylenol), ibuprofen (Advil, Motrin), or naproxen (Aleve), to relieve pain and swelling. Read and follow all instructions on the label.  ?? Do not take two or more pain medicines at the same time unless the doctor told you to. Many pain medicines have acetaminophen, which is Tylenol. Too much acetaminophen (Tylenol) can be harmful.  ?? Wear a supportive bra, such as a sports bra or a jog bra.  ?? Cut down on the amount of fat in your diet. If you need help planning healthy meals, see a dietitian.  ?? Get at least 30 minutes of exercise on most days of the week. Walking is a good choice. You also may want to do other activities, such as running, swimming, cycling, or playing tennis or team sports.  ?? Keep a healthy sleep pattern. Go to bed at the same time every night, and get up at the same time every day.  When should you call for help?  Call your doctor now or seek immediate medical care if:  ?? You have new changes in a breast, such as:  ?? A lump or thickening in your breast or armpit.  ?? A change in the breast's size or shape.  ?? Skin changes, such as dimples or puckers.  ?? Nipple discharge.   ?? A change in the color or feel of the skin of your breast or the darker area around the nipple (areola).  ?? A change in the shape of the nipple (it may look like it's being pulled into the breast).  ?? You have symptoms of a breast infection, such as:  ?? Increased pain, swelling, redness, or warmth around a breast.  ?? Red streaks extending from the breast.  ?? Pus draining from a breast.  ?? A fever.  Watch closely for changes in your health, and be sure to contact your doctor if:  ?? Your breast pain does not get better after 1 week.  ?? You have a lump or thickening in your breast or armpit.   Where can you learn more?   Go to http://www.healthwise.net/[facility]  Enter Z395 in the search box to learn more about "Breast Pain: Care Instructions."   ?? 2006-2015 Healthwise, Incorporated. Care instructions adapted under license by [facility] (which disclaims liability or warranty for this information). This care instruction is for use with your licensed healthcare professional. If you have questions about a medical condition or this instruction, always ask your healthcare professional. Healthwise, Incorporated disclaims any warranty or liability for your use of this information.  Content Version: 10.7.482551; Current as of: November 17, 2012

## 2014-04-10 ENCOUNTER — Encounter

## 2014-04-11 NOTE — Telephone Encounter (Signed)
Pt calling to request medication refill of diazepam (VALIUM) 10 mg tablet and oxyCODONE-acetaminophen (PERCOCET) 5-325 mg per tablet be printed. Pt has about 0 tabs remaining. Advised pt of 72 hour time frame for refill requests, and that these will need to be p/u from office. Please advise.

## 2014-04-11 NOTE — Telephone Encounter (Signed)
Patient requesting printed refills on below medications, please advise. Thank you.     Requested Prescriptions     Pending Prescriptions Disp Refills   ??? diazepam (VALIUM) 10 mg tablet 5 Tab 0     Sig: Take 1 Tab by mouth every six (6) hours as needed for Anxiety. Max Daily Amount: 40 mg.   ??? oxyCODONE-acetaminophen (PERCOCET) 5-325 mg per tablet 30 Tab 0     Sig: Take 1 Tab by mouth every four (4) hours as needed for Pain. Max Daily Amount: 6 Tabs.

## 2014-04-11 NOTE — Progress Notes (Signed)
Per pts request, faxed Mammo order to [facility]. They will call her to sched. Closing encounter.

## 2014-04-17 MED ORDER — OXYCODONE-ACETAMINOPHEN 5 MG-325 MG TAB
5-325 mg | ORAL_TABLET | ORAL | Status: DC | PRN
Start: 2014-04-17 — End: 2014-04-26

## 2014-04-17 MED ORDER — DIAZEPAM 10 MG TAB
10 mg | ORAL_TABLET | Freq: Four times a day (QID) | ORAL | Status: DC | PRN
Start: 2014-04-17 — End: 2014-05-27

## 2014-04-17 NOTE — Telephone Encounter (Signed)
Patient has been called and notified that rx's were approved and printed for her to pick up. Patient stated that she will pick up today. Patient gave verbal understanding and has no further questions at this time. Closing encounter.

## 2014-04-26 ENCOUNTER — Ambulatory Visit
Admit: 2014-04-26 | Discharge: 2014-04-26 | Payer: PRIVATE HEALTH INSURANCE | Attending: Internal Medicine | Primary: Family Medicine

## 2014-04-26 DIAGNOSIS — M542 Cervicalgia: Secondary | ICD-10-CM

## 2014-04-26 MED ORDER — GABAPENTIN ENACARBIL ER 300 MG TABLET,EXTENDED RELEASE
300 mg | ORAL_TABLET | Freq: Every evening | ORAL | Status: DC
Start: 2014-04-26 — End: 2014-08-01

## 2014-04-26 MED ORDER — OXYCODONE-ACETAMINOPHEN 7.5 MG-325 MG TAB
ORAL_TABLET | Freq: Four times a day (QID) | ORAL | Status: DC | PRN
Start: 2014-04-26 — End: 2014-05-27

## 2014-04-26 NOTE — Telephone Encounter (Signed)
Pt is calling to inform Dr. Aneta MinsPhillip that her coupon for [facility] did not go through. Pt also would like to inform Dr. Aneta MinsPhillip that the pharmacist told her that the Horizant, Gabapentin and Neurontin all have the same effect and are the same thing. Pt states she would like to know if there is something else she can take that isn't the same as those? Please advise.

## 2014-04-26 NOTE — Progress Notes (Signed)
HPI    Neck pain- patient reports that her pain persists and continues to be associated with numbness and tingling in the right hand.      Back pain with b/l sciatica- pt reports that this has persisted and in fact her sciatica has worsened.      Left breast pain- pt reports that this persists.  She rates her breast pain beyond 10, says it is intermediate lasting 15-20 minutes, sharp and squeezing and occuring 4 days per week.      Review of Systems:  ROS:  History obtained from the patient  ?? General: + abnormal weight gain  ?? HEENT: no further episodes of swollen lips  ?? Respiratory: no cough, shortness of breath, or wheezing  ?? Cardiovascular: no chest pain, palpitations, or dyspnea on exertion  ?? Gastrointestinal: no abdominal pain, N/V, change in bowel habits, or black or bloody stools  ?? GU- left breast pain  ?? Musculoskeletal: + neck and back pains  ?? Neurological: +numbness, tingling of the upper and lower extremities  ?? Psychological: negative for - anxiety, depression, sleep disturbances, suicidal or homicidal ideations  ?? Skin- dry skin, hair loss    Past Medical History   Diagnosis Date   ??? Spondylosis    ??? Torticollis    ??? Chronic neck pain        Family History   Problem Relation Age of Onset   ??? Diabetes Mother    ??? Hypertension Mother    ??? Cancer Father      prostate   ??? Hypertension Father        Past Surgical History   Procedure Laterality Date   ??? Hx total abdominal hysterectomy     ??? Hx knee arthroscopy       x4   ??? Hx rotator cuff repair       x2   ??? Hx cervical fusion         History     Social History   ??? Marital Status: SINGLE     Spouse Name: N/A   ??? Number of Children: N/A   ??? Years of Education: N/A     Occupational History   ??? Not on file.     Social History Main Topics   ??? Smoking status: Former Smoker     Quit date: 02/21/1983   ??? Smokeless tobacco: Never Used   ??? Alcohol Use: No   ??? Drug Use: No   ??? Sexual Activity: Not Currently     Other Topics Concern   ??? Not on file      Social History Narrative       Current Outpatient Prescriptions   Medication Sig Dispense Refill   ??? budesonide-formoterol (SYMBICORT) 160-4.5 mcg/actuation HFA inhaler Take 2 Puffs by inhalation two (2) times a day.     ??? diazepam (VALIUM) 10 mg tablet Take 1 Tab by mouth every six (6) hours as needed for Anxiety. Max Daily Amount: 40 mg. Indications: ANXIETY, MUSCLE SPASM 30 Tab 0   ??? oxyCODONE-acetaminophen (PERCOCET) 5-325 mg per tablet Take 1 Tab by mouth every four (4) hours as needed for Pain. Max Daily Amount: 6 Tabs. 30 Tab 0   ??? ergocalciferol (VITAMIN D2) 50,000 unit capsule Take 1 Cap by mouth every seven (7) days. 6 Cap 1   ??? estradiol (ESTRACE) 2 mg tablet Take 2 mg by mouth daily.     ??? estradiol (VAGIFEM) 10 mcg tab vaginal tablet Insert 10 mcg into  vagina. Three times weekly.     ??? multivitamin (ONE A DAY) tablet Take 1 Tab by mouth daily.     ??? BIOTIN PO Take 5,000 mg by mouth daily.     ??? GLUCOSAMINE-CONDROITIN-HRB#182 PO Take 1 Tab by mouth daily.         Allergies   Allergen Reactions   ??? Latex Rash   ??? Shellfish Derived Anaphylaxis   ??? Anaprox [Naproxen Sodium] Other (comments)     GI Bleeding   ??? Celebrex [Celecoxib] Other (comments)     GI Bleeding   ??? Doxycycline Unknown (comments)   ??? Motrin [Ibuprofen] Other (comments)     GI Bleeding   ??? Naprosyn [Naproxen] Other (comments)     GI Bleeding   ??? Neurontin [Gabapentin] Unknown (comments)   ??? Norco [Hydrocodone-Acetaminophen] Other (comments)     Hair Sheds   ??? Nsaids (Non-Steroidal Anti-Inflammatory Drug) Other (comments)     Gastric bleed   ??? Singulair [Montelukast] Other (comments)     Caused Fainting   ??? Strawberry Hives and Nausea and Vomiting   ??? Sulfamethoxazole-Trimethoprim Other (comments)     Increases pain   ??? Tramadol Other (comments)     confusion       Physical Examination:   BP 124/77 mmHg   Pulse 75   Temp(Src) 97.7 ??F (36.5 ??C) (Oral)   Resp 16   Ht 5' 2.5" (1.588 m)   Wt 162 lb (73.483 kg)   BMI 29.14 kg/m2   SpO2 99%     General appearance - alert, well appearing, and in no distress  Mental status - alert, oriented to person, place, and time  PULM: clear to auscultation, no wheezes, rales or rhonchi, symmetric air entry  CVS - normal rate, regular rhythm, normal S1, S2, no murmurs, rubs, clicks or gallops  Musculoskeletal - there is tenderness to palpation of the cervical area, decrease grip strength on the right.  +5/5 strength in the lower extremities b/l.  Decrease ROM of the patient's neck      Asessment/ Plan    1. Cervical pain (neck) with radiculopathy  - gabapentin enacarbil (HORIZANT) 300 mg TbER; Take 300 mg by mouth nightly.  Dispense: 30 Tab; Refill: 0  - REFERRAL TO PHYSICAL THERAPY  - oxyCODONE-acetaminophen (PERCOCET) 7.5-325 mg per tablet; Take 1 Tab by mouth every six (6) hours as needed for Pain. Max Daily Amount: 4 Tabs.  Dispense: 40 Tab; Refill: 0  - pt following up with neurosurgery  - valium for muscle spasms    2. Acute bilateral low back pain with bilateral sciatica  - gabapentin enacarbil (HORIZANT) 300 mg TbER; Take 300 mg by mouth nightly.  Dispense: 30 Tab; Refill: 0  - REFERRAL TO PHYSICAL THERAPY  - oxyCODONE-acetaminophen (PERCOCET) 7.5-325 mg per tablet; Take 1 Tab by mouth every six (6) hours as needed for Pain. Max Daily Amount: 4 Tabs.  Dispense: 40 Tab; Refill: 0  - pt to follow up with neurosurgery    3. Breast pain- awaiting mammogram    Plan of care has been discussed with the patient and she is in agreement  Dyann Ruddlearik A Quin Mathenia, MD

## 2014-04-26 NOTE — Progress Notes (Signed)
1. Have you been to the ER, urgent care clinic since your last visit?  Hospitalized since your last visit?No    2. Have you seen or consulted any other health care providers outside of the [facility] since your last visit?  Include any pap smears or colon screening. No

## 2014-04-30 NOTE — Telephone Encounter (Signed)
Please see message below. Thank you

## 2014-05-03 NOTE — Telephone Encounter (Signed)
Attempted to contact the patient but was not successful.  Message left.

## 2014-05-14 MED ORDER — VITAMIN D2 1,250 MCG (50,000 UNIT) CAPSULE
1250 mcg (50,000 unit) | ORAL_CAPSULE | ORAL | Status: DC
Start: 2014-05-14 — End: 2014-06-27

## 2014-05-16 ENCOUNTER — Encounter: Admit: 2014-05-16 | Primary: Family Medicine

## 2014-05-16 ENCOUNTER — Ambulatory Visit
Admit: 2014-05-16 | Discharge: 2014-05-16 | Payer: PRIVATE HEALTH INSURANCE | Attending: Internal Medicine | Primary: Family Medicine

## 2014-05-16 DIAGNOSIS — M79632 Pain in left forearm: Secondary | ICD-10-CM

## 2014-05-16 DIAGNOSIS — M25522 Pain in left elbow: Secondary | ICD-10-CM

## 2014-05-16 DIAGNOSIS — M549 Dorsalgia, unspecified: Secondary | ICD-10-CM | POA: Insufficient documentation

## 2014-05-16 DIAGNOSIS — N644 Mastodynia: Secondary | ICD-10-CM | POA: Insufficient documentation

## 2014-05-16 DIAGNOSIS — M5489 Other dorsalgia: Secondary | ICD-10-CM | POA: Insufficient documentation

## 2014-05-16 DIAGNOSIS — M5412 Radiculopathy, cervical region: Secondary | ICD-10-CM | POA: Insufficient documentation

## 2014-05-16 DIAGNOSIS — M545 Low back pain, unspecified: Secondary | ICD-10-CM | POA: Insufficient documentation

## 2014-05-16 DIAGNOSIS — M5442 Lumbago with sciatica, left side: Secondary | ICD-10-CM

## 2014-05-16 DIAGNOSIS — M5441 Lumbago with sciatica, right side: Secondary | ICD-10-CM | POA: Insufficient documentation

## 2014-05-16 NOTE — Progress Notes (Signed)
HPI    Left upper extremity pain- pt reports left upper extremity pain that has been on going for 2 weeks.  She reports that it started in her left antecubital fossa and elbow.  She reports that her pain radiates to her left upper extremity.  Patient reports numbness and tingling in the upper extremities.      Back pain with b/l sciatica- pt reports that this has persisted and in fact her sciatica has worsened.      Review of Systems:  ROS:  History obtained from the patient  ?? General: no further reports of further weight gain  ?? HEENT: no further episodes of swollen lips  ?? Respiratory: no cough, shortness of breath, or wheezing  ?? Cardiovascular: no chest pain, palpitations, or dyspnea on exertion  ?? Gastrointestinal: no abdominal pain, N/V, change in bowel habits, or black or bloody stools  ?? GU- left breast pain  ?? Musculoskeletal: + neck and back pains  ?? Neurological: +numbness, tingling of the upper and lower extremities, radiating pains to the b/l lower extremities  ?? Psychological: negative for - anxiety, depression, sleep disturbances, suicidal or homicidal ideations  ?? Skin- dry skin, hair loss    Past Medical History   Diagnosis Date   ??? Spondylosis    ??? Torticollis    ??? Chronic neck pain        Family History   Problem Relation Age of Onset   ??? Diabetes Mother    ??? Hypertension Mother    ??? Cancer Father      prostate   ??? Hypertension Father        Past Surgical History   Procedure Laterality Date   ??? Hx total abdominal hysterectomy     ??? Hx knee arthroscopy       x4   ??? Hx rotator cuff repair       x2   ??? Hx cervical fusion         History     Social History   ??? Marital Status: SINGLE     Spouse Name: N/A   ??? Number of Children: N/A   ??? Years of Education: N/A     Occupational History   ??? Not on file.     Social History Main Topics   ??? Smoking status: Former Smoker     Quit date: 02/21/1983   ??? Smokeless tobacco: Never Used   ??? Alcohol Use: No   ??? Drug Use: No   ??? Sexual Activity: Not Currently      Other Topics Concern   ??? Not on file     Social History Narrative       Current Outpatient Prescriptions   Medication Sig Dispense Refill   ??? VITAMIN D2 50,000 unit capsule TAKE ONE CAPSULE BY MOUTH EVERY 7 DAYS 6 Cap 0   ??? budesonide-formoterol (SYMBICORT) 160-4.5 mcg/actuation HFA inhaler Take 2 Puffs by inhalation two (2) times a day.     ??? estradiol (ESTRACE) 2 mg tablet Take 2 mg by mouth daily.     ??? estradiol (VAGIFEM) 10 mcg tab vaginal tablet Insert 10 mcg into vagina. Three times weekly.     ??? multivitamin (ONE A DAY) tablet Take 1 Tab by mouth daily.     ??? BIOTIN PO Take 5,000 mg by mouth daily.     ??? gabapentin enacarbil (HORIZANT) 300 mg TbER Take 300 mg by mouth nightly. 30 Tab 0   ??? methocarbamol (ROBAXIN-750) 750 mg  tablet Take 1 Tab by mouth three (3) times daily as needed. 30 Tab 0   ??? oxyCODONE-acetaminophen (PERCOCET) 7.5-325 mg per tablet Take 1 Tab by mouth every six (6) hours as needed for Pain. Max Daily Amount: 4 Tabs. 40 Tab 0   ??? diazepam (VALIUM) 10 mg tablet Take 1 Tab by mouth every six (6) hours as needed for Anxiety. Max Daily Amount: 40 mg. Indications: ANXIETY, MUSCLE SPASM 30 Tab 0   ??? gabapentin enacarbil (HORIZANT) 300 mg TbER Take 300 mg by mouth nightly. 30 Tab 0   ??? GLUCOSAMINE-CONDROITIN-HRB#182 PO Take 1 Tab by mouth daily.         Allergies   Allergen Reactions   ??? Latex Rash   ??? Shellfish Derived Anaphylaxis   ??? Anaprox [Naproxen Sodium] Other (comments)     GI Bleeding   ??? Celebrex [Celecoxib] Other (comments)     GI Bleeding   ??? Codeine Itching and Nausea Only   ??? Doxycycline Unknown (comments)   ??? Motrin [Ibuprofen] Other (comments)     GI Bleeding   ??? Naprosyn [Naproxen] Other (comments)     GI Bleeding   ??? Neurontin [Gabapentin] Unknown (comments)   ??? Norco [Hydrocodone-Acetaminophen] Other (comments)     Hair Sheds   ??? Nsaids (Non-Steroidal Anti-Inflammatory Drug) Other (comments)     Gastric bleed   ??? Singulair [Montelukast] Other (comments)     Caused Fainting    ??? Strawberry Hives and Nausea and Vomiting   ??? Sulfamethoxazole-Trimethoprim Other (comments)     Increases pain   ??? Tramadol Other (comments)     confusion       Physical Examination:   BP 116/72 mmHg   Pulse 74   Temp(Src) 97.5 ??F (36.4 ??C) (Oral)   Resp 15   Ht 5' 2.5" (1.588 m)   Wt 161 lb 12.8 oz (73.392 kg)   BMI 29.10 kg/m2   SpO2 99%    General appearance - alert, moderate distress  Mental status - alert, oriented to person, place, and time  PULM: clear to auscultation, no wheezes, rales or rhonchi, symmetric air entry  CVS - normal rate, regular rhythm, normal S1, S2, no murmurs, rubs, clicks or gallops  Musculoskeletal - there is tenderness to palpation of the cervical area, decrease grip strength on the right.  +5/5 strength in the lower extremities b/l.  Decrease ROM of the patient's neck  Tenderness to palpation of the left elbow.  No forearm tenderness      Asessment/ Plan    1. Left forearm pain  - XR FOREARM LT AP/LAT; Future  - pt is already on percocet   - pt asked to use compound cream   - may have to add this to PT as well    2. Left elbow pain  - XR ELBOW LT AP/LAT; Future  - pt is already on percocet, no further pain meds needed    3. Chronic neck and back pains- continue to await the the patient to start PT    Plan of care has been discussed with the patient and she is in agreement  Dyann Ruddlearik A Janneth Krasner, MD

## 2014-05-16 NOTE — Progress Notes (Signed)
Marlowe AltVanessa Messinger is a 54 y.o. female presents in office with left elbow pain. Started in antecubital space and radiating down to forearm and finger. Patient had this pain before as a result of a herniated disc. Spine specialist said that this pain is not coming from any issues with her spine. Pain is intermittent with no known triggers. Patient having sciatic nerve pain as well today.     1. Have you been to the ER, urgent care clinic since your last visit?  Hospitalized since your last visit?No    2. Have you seen or consulted any other health care providers outside of the [facility] since your last visit?  Include any pap smears or colon screening. No

## 2014-05-16 NOTE — Patient Instructions (Signed)
Back Pain: Care Instructions  Your Care Instructions     Back pain has many possible causes. It is often related to problems with muscles and ligaments of the back. It may also be related to problems with the nerves, discs, or bones of the back. Moving, lifting, standing, sitting, or sleeping in an awkward way can strain the back. Sometimes you don't notice the injury until later. Arthritis is another common cause of back pain.  Although it may hurt a lot, back pain usually improves on its own within several weeks. Most people recover in 12 weeks or less. Using good home treatment and being careful not to stress your back can help you feel better sooner.  Follow-up care is a key part of your treatment and safety. Be sure to make and go to all appointments, and call your doctor if you are having problems. It???s also a good idea to know your test results and keep a list of the medicines you take.  How can you care for yourself at home?  ?? Sit or lie in positions that are most comfortable and reduce your pain. Try one of these positions when you lie down:  ?? Lie on your back with your knees bent and supported by large pillows.  ?? Lie on the floor with your legs on the seat of a sofa or chair.  ?? Lie on your side with your knees and hips bent and a pillow between your legs.  ?? Lie on your stomach if it does not make pain worse.  ?? Do not sit up in bed, and avoid soft couches and twisted positions. Bed rest can help relieve pain at first, but it delays healing. Avoid bed rest after the first day of back pain.  ?? Change positions every 30 minutes. If you must sit for long periods of time, take breaks from sitting. Get up and walk around, or lie in a comfortable position.  ?? Try using a heating pad on a low or medium setting for 15 to 20 minutes every 2 or 3 hours. Try a warm shower in place of one session with the heating pad.  ?? You can also try an ice pack for 10 to 15 minutes every 2 to 3 hours.  Put a thin cloth between the ice pack and your skin.  ?? Take pain medicines exactly as directed.  ?? If the doctor gave you a prescription medicine for pain, take it as prescribed.  ?? If you are not taking a prescription pain medicine, ask your doctor if you can take an over-the-counter medicine.  ?? Take short walks several times a day. You can start with 5 to 10 minutes, 3 or 4 times a day, and work up to longer walks. Walk on level surfaces and avoid hills and stairs until your back is better.  ?? Return to work and other activities as soon as you can. Continued rest without activity is usually not good for your back.  ?? To prevent future back pain, do exercises to stretch and strengthen your back and stomach. Learn how to use good posture, safe lifting techniques, and proper body mechanics.  When should you call for help?  Call your doctor now or seek immediate medical care if:  ?? You have new or worsening numbness in your legs.  ?? You have new or worsening weakness in your legs. (This could make it hard to stand up.)  ?? You lose control of your bladder or bowels.    Watch closely for changes in your health, and be sure to contact your doctor if:  ?? Your pain gets worse.  ?? You are not getting better after 2 weeks.   Where can you learn more?   Go to http://www.healthwise.net/[facility]  Enter I594 in the search box to learn more about "Back Pain: Care Instructions."   ?? 2006-2016 Healthwise, Incorporated. Care instructions adapted under license by [employer] (which disclaims liability or warranty for this information). This care instruction is for use with your licensed healthcare professional. If you have questions about a medical condition or this instruction, always ask your healthcare professional. Healthwise, Incorporated disclaims any warranty or liability for your use of this information.  Content Version: 10.8.513193; Current as of: May 25, 2013

## 2014-05-17 NOTE — Telephone Encounter (Signed)
This has been addressed. Closing encounter.

## 2014-05-18 NOTE — Telephone Encounter (Signed)
Please let the patient know that her X rays were normal.  Please ask her to use the cream on this area up to four times per day regardless of if she feels the pain or not.  If it persists I will send her to Ortho.

## 2014-05-18 NOTE — Telephone Encounter (Signed)
Patient has been called and notified of results. Patient stated that she would like to go ahead and be referred to ortho. Stated that she is still in persistent pain. Referral has been pended to Dr. Aneta MinsPhillip

## 2014-05-20 NOTE — Telephone Encounter (Signed)
Pt requesting new coupon for gabapentin enacarbil (HORIZANT) 300 mg TbER . If possible could she could another one left at the front desk. Please advise.

## 2014-05-21 NOTE — Telephone Encounter (Signed)
Patient has been called with no answer. Unable to leave message at this time. Coupon left in folder at front desk for patient. Closing encounter.

## 2014-05-23 ENCOUNTER — Encounter

## 2014-05-23 NOTE — Telephone Encounter (Signed)
Pt calling to request medication refill of oxyCODONE-acetaminophen (PERCOCET) 7.5-325 mg per tablet & diazepam (VALIUM) 10 mg tablet   be filled. Informed pt she may need to sch an appt.Pt has about 0 abs remaining. Advised pt of 72 hour time frame for refill requests.  Ms. Kelsey Bradley is also requesting a new coupon for gabapentin enacarbil (HORIZANT) 300 mg TbER. Marland Kitchen. Please advise.

## 2014-05-27 NOTE — Telephone Encounter (Signed)
Pt calling to see if Dr. Aneta MinsPhillip can order an MRI for her left shoulder and arm b/c shes still experiencing high levels of pain. Please advise.

## 2014-05-27 NOTE — Telephone Encounter (Signed)
Patient is requesting a refill on the following medication. Patient was recently seen by you on the 12th of this month. Looks like patient received these medications the 22nd of last month. Would you like to fill? Please advise. Thank you.    Requested Prescriptions     Pending Prescriptions Disp Refills   ??? oxyCODONE-acetaminophen (PERCOCET) 7.5-325 mg per tablet 40 Tab 0     Sig: Take 1 Tab by mouth every six (6) hours as needed for Pain. Max Daily Amount: 4 Tabs.   ??? diazepam (VALIUM) 10 mg tablet 30 Tab 0     Sig: Take 1 Tab by mouth every six (6) hours as needed for Anxiety. Max Daily Amount: 40 mg. Indications: ANXIETY, MUSCLE SPASM   ??? gabapentin enacarbil (HORIZANT) 300 mg TbER 30 Tab 0     Sig: Take 300 mg by mouth nightly.

## 2014-05-29 NOTE — Telephone Encounter (Signed)
Called the patient to discuss Horizant and if she had any side effects.  Awaiting her call.

## 2014-05-29 NOTE — Telephone Encounter (Signed)
Pt is calling to follow up on previous note, Pt would like an MRI order for right and left schoulder to be send [facility]  Pt is in a lot of pain and would like to have it checked. Also pt is requesting Rx on  diazepam (VALIUM) 10 mg tablet , PERCOCET) 7.5-325 mg. Pt was advised that these medications require an appointment, pt stated that she was just seen and can't take anymore time off work.Please advise.

## 2014-05-29 NOTE — Telephone Encounter (Signed)
Please see message below. Thank you

## 2014-05-30 MED ORDER — DIAZEPAM 10 MG TAB
10 mg | ORAL_TABLET | Freq: Four times a day (QID) | ORAL | Status: DC | PRN
Start: 2014-05-30 — End: 2014-07-31

## 2014-05-30 MED ORDER — OXYCODONE-ACETAMINOPHEN 7.5 MG-325 MG TAB
ORAL_TABLET | Freq: Four times a day (QID) | ORAL | Status: DC | PRN
Start: 2014-05-30 — End: 2014-06-19

## 2014-05-30 NOTE — Telephone Encounter (Signed)
Patient called and stated that she would like to try horizant.  She also reports b/l shoulder pain and left hip pain

## 2014-05-30 NOTE — Telephone Encounter (Signed)
Pt returning call to Dr. Aneta MinsPhillip. Would like a call back at her mobile #.

## 2014-06-01 ENCOUNTER — Encounter: Admit: 2014-06-01 | Primary: Family Medicine

## 2014-06-01 ENCOUNTER — Ambulatory Visit: Admit: 2014-06-01 | Payer: PRIVATE HEALTH INSURANCE | Attending: Internal Medicine | Primary: Family Medicine

## 2014-06-01 DIAGNOSIS — M25512 Pain in left shoulder: Secondary | ICD-10-CM

## 2014-06-01 DIAGNOSIS — M25551 Pain in right hip: Secondary | ICD-10-CM

## 2014-06-01 DIAGNOSIS — G8929 Other chronic pain: Secondary | ICD-10-CM

## 2014-06-01 MED ORDER — METHOCARBAMOL 750 MG TAB
750 mg | ORAL_TABLET | Freq: Three times a day (TID) | ORAL | Status: DC | PRN
Start: 2014-06-01 — End: 2014-08-01

## 2014-06-01 MED ORDER — GABAPENTIN ENACARBIL ER 300 MG TABLET,EXTENDED RELEASE
300 mg | ORAL_TABLET | Freq: Every evening | ORAL | Status: DC
Start: 2014-06-01 — End: 2014-08-01

## 2014-06-01 NOTE — Progress Notes (Signed)
HPI    Right hip pain- patient reports a sudden onset of left hip pain that was debilitating.  She can point to know initiating factor.  Walking she reports aggravates it and her pain is made better by laying with the pillow between her legs.      Left shoulder pain- started about a month ago and has since gotten progressively worse.  She reports not lifting anything heavy.  Patient notes a radiating pain from her left shoulder to her fingers.  Trying to comb her hair results in increased pain.    Right shoulder pain with associated numbness and tingling of the last two digits of her right hand- pt has a h/o prior rotator cuff repair.  She can again pinpoint no specific injury to this area and denies any heavy lifting.      Back pain with b/l sciatica- persists.    Review of Systems:  ROS:  History obtained from the patient  ?? General: no acute distress  ?? HEENT: occasional itchy throat  ?? Respiratory: no cough, shortness of breath, or wheezing  ?? Cardiovascular: no chest pain, palpitations, or dyspnea on exertion  ?? Gastrointestinal: no abdominal pain, N/V, change in bowel habits, or black or bloody stools  ?? Musculoskeletal: + neck, back and b/l shoulder pains  ?? Neurological: +numbness, tingling of the upper and lower extremities  ?? Psychological: negative for - anxiety, depression, sleep disturbances, suicidal or homicidal ideations    Past Medical History   Diagnosis Date   ??? Spondylosis    ??? Torticollis    ??? Chronic neck pain        Family History   Problem Relation Age of Onset   ??? Diabetes Mother    ??? Hypertension Mother    ??? Cancer Father      prostate   ??? Hypertension Father        Past Surgical History   Procedure Laterality Date   ??? Hx total abdominal hysterectomy     ??? Hx knee arthroscopy       x4   ??? Hx rotator cuff repair       x2   ??? Hx cervical fusion         History     Social History   ??? Marital Status: SINGLE     Spouse Name: N/A   ??? Number of Children: N/A   ??? Years of Education: N/A      Occupational History   ??? Not on file.     Social History Main Topics   ??? Smoking status: Former Smoker     Quit date: 02/21/1983   ??? Smokeless tobacco: Never Used   ??? Alcohol Use: No   ??? Drug Use: No   ??? Sexual Activity: Not Currently     Other Topics Concern   ??? Not on file     Social History Narrative       Current Outpatient Prescriptions   Medication Sig Dispense Refill   ??? gabapentin enacarbil (HORIZANT) 300 mg TbER Take 300 mg by mouth nightly. 30 Tab 0   ??? methocarbamol (ROBAXIN-750) 750 mg tablet Take 1 Tab by mouth three (3) times daily as needed. 30 Tab 0   ??? oxyCODONE-acetaminophen (PERCOCET) 7.5-325 mg per tablet Take 1 Tab by mouth every six (6) hours as needed for Pain. Max Daily Amount: 4 Tabs. 40 Tab 0   ??? diazepam (VALIUM) 10 mg tablet Take 1 Tab by mouth every six (6)  hours as needed for Anxiety. Max Daily Amount: 40 mg. Indications: ANXIETY, MUSCLE SPASM 30 Tab 0   ??? VITAMIN D2 50,000 unit capsule TAKE ONE CAPSULE BY MOUTH EVERY 7 DAYS 6 Cap 0   ??? budesonide-formoterol (SYMBICORT) 160-4.5 mcg/actuation HFA inhaler Take 2 Puffs by inhalation two (2) times a day.     ??? estradiol (ESTRACE) 2 mg tablet Take 2 mg by mouth daily.     ??? estradiol (VAGIFEM) 10 mcg tab vaginal tablet Insert 10 mcg into vagina. Three times weekly.     ??? multivitamin (ONE A DAY) tablet Take 1 Tab by mouth daily.     ??? BIOTIN PO Take 5,000 mg by mouth daily.     ??? gabapentin enacarbil (HORIZANT) 300 mg TbER Take 300 mg by mouth nightly. 30 Tab 0   ??? GLUCOSAMINE-CONDROITIN-HRB#182 PO Take 1 Tab by mouth daily.         Allergies   Allergen Reactions   ??? Latex Rash   ??? Shellfish Derived Anaphylaxis   ??? Anaprox [Naproxen Sodium] Other (comments)     GI Bleeding   ??? Celebrex [Celecoxib] Other (comments)     GI Bleeding   ??? Codeine Itching and Nausea Only   ??? Doxycycline Unknown (comments)   ??? Motrin [Ibuprofen] Other (comments)     GI Bleeding   ??? Naprosyn [Naproxen] Other (comments)     GI Bleeding    ??? Neurontin [Gabapentin] Unknown (comments)   ??? Norco [Hydrocodone-Acetaminophen] Other (comments)     Hair Sheds   ??? Nsaids (Non-Steroidal Anti-Inflammatory Drug) Other (comments)     Gastric bleed   ??? Singulair [Montelukast] Other (comments)     Caused Fainting   ??? Strawberry Hives and Nausea and Vomiting   ??? Sulfamethoxazole-Trimethoprim Other (comments)     Increases pain   ??? Tramadol Other (comments)     confusion       Physical Examination:   BP 127/73 mmHg   Pulse 83   Temp(Src) 97.9 ??F (36.6 ??C) (Oral)   Resp 21   Ht 5' 2.5" (1.588 m)   Wt 159 lb 6.4 oz (72.303 kg)   BMI 28.67 kg/m2   SpO2 98%    General appearance - alert, well appearing, and in no distress  Mental status - alert, oriented to person, place, and time  PULM: clear to auscultation, no wheezes, rales or rhonchi, symmetric air entry  CVS - normal rate, regular rhythm, normal S1, S2, no murmurs, rubs, clicks or gallops  Musculoskeletal - tenderness to palpation of the b/l shoulders with the left being the worse.  ROM is somewhat limited of the left shoulder.  Medial aspect of left shoulder appears to be swollen    Asessment/ Plan    1. Arthralgia of right hip  - XR HIP RT W OR WO PELV 2-3 VWS; Future    2. Acute pain of left shoulder  - XR SHOULDER LT AP/LAT MIN 2 V; Future    3. Acute bilateral low back pain with bilateral sciatica  - gabapentin enacarbil (HORIZANT) 300 mg TbER; Take 300 mg by mouth nightly.  Dispense: 30 Tab; Refill: 0  - patient reports a "foggy" sensation on neurontin.   - she has been asked to take this 2 hours apart from the robaxin    4. Chronic right shoulder pain  - XR SHOULDER RT AP/LAT MIN 2 V; Future    5. Muscle spasm (back, shoulder and neck)  - methocarbamol (ROBAXIN-750) 750 mg  tablet; Take 1 Tab by mouth three (3) times daily as needed.  Dispense: 30 Tab; Refill: 0    Please note that the patient already has percocets for pain.  I will ask her to take robaxin as oppose to the valium.  Spoke with the patient  previously and I have emphasized the importance of attending physical therapy,    Plan of care has been discussed with the patient and she is in agreement.    Dyann Ruddle, MD

## 2014-06-01 NOTE — Patient Instructions (Signed)
Shoulder Pain: Care Instructions  Your Care Instructions     You can hurt your shoulder by using it too much during an activity, such as fishing or baseball. It can also happen as part of the everyday wear and tear of getting older. Shoulder injuries can be slow to heal, but your shoulder should get better with time.  Your doctor may recommend a sling to rest your shoulder. If you have injured your shoulder, you may need testing and treatment.  Follow-up care is a key part of your treatment and safety. Be sure to make and go to all appointments, and call your doctor if you are having problems. It's also a good idea to know your test results and keep a list of the medicines you take.  How can you care for yourself at home?  ?? Take pain medicines exactly as directed.  ?? If the doctor gave you a prescription medicine for pain, take it as prescribed.  ?? If you are not taking a prescription pain medicine, ask your doctor if you can take an over-the-counter medicine.  ?? Do not take two or more pain medicines at the same time unless the doctor told you to. Many pain medicines contain acetaminophen, which is Tylenol. Too much acetaminophen (Tylenol) can be harmful.  ?? If your doctor recommends that you wear a sling, use it as directed. Do not take it off before your doctor tells you to.  ?? Put ice or a cold pack on the sore area for 10 to 20 minutes at a time. Put a thin cloth between the ice and your skin.  ?? If there is no swelling, you can put moist heat, a heating pad, or a warm cloth on your shoulder. Some doctors suggest alternating between hot and cold.  ?? Rest your shoulder for a few days. If your doctor recommends it, you can then begin gentle exercise of the shoulder, but do not lift anything heavy.  When should you call for help?  Call 911 anytime you think you may need emergency care. For example, call if:  ?? You have chest pain or pressure. This may occur with:  ?? Sweating.  ?? Shortness of breath.   ?? Nausea or vomiting.  ?? Pain that spreads from the chest to the neck, jaw, or one or both shoulders or arms.  ?? Dizziness or lightheadedness.  ?? A fast or uneven pulse.  After calling 911, chew 1 adult-strength aspirin. Wait for an ambulance. Do not try to drive yourself.  ?? Your arm or hand is cool or pale or changes color.  Call your doctor now or seek immediate medical care if:  ?? You have signs of infection, such as:  ?? Increased pain, swelling, warmth, or redness in your shoulder.  ?? Red streaks leading from a place on your shoulder.  ?? Pus draining from an area of your shoulder.  ?? Swollen lymph nodes in your neck, armpits, or groin.  ?? A fever.  Watch closely for changes in your health, and be sure to contact your doctor if:  ?? You cannot use your shoulder.  ?? Your shoulder does not get better as expected.  Where can you learn more?  Go to http://www.healthwise.net/[facility]  Enter H996 in the search box to learn more about "Shoulder Pain: Care Instructions."  ?? 2006-2016 Healthwise, Inc[employer]. Care instructions adapted under license by [employer] (which disclaims liability or warranty for this information). This care instruction is for use with   your licensed healthcare professional. If you have questions about a medical condition or this instruction, always ask your healthcare professional. Healthwise, Incorporated disclaims any warranty or liability for your use of this information.  Content Version: 10.9.538570; Current as of: May 25, 2013

## 2014-06-01 NOTE — Progress Notes (Signed)
Kelsey Bradley is a 54 y.o. female presents in office with c/o shoulder and hip pain.     1. Have you been to the ER, urgent care clinic since your last visit?  Hospitalized since your last visit?No    2. Have you seen or consulted any other health care providers outside of the [facility] since your last visit?  Include any pap smears or colon screening. No

## 2014-06-08 DIAGNOSIS — M79632 Pain in left forearm: Secondary | ICD-10-CM

## 2014-06-12 NOTE — Telephone Encounter (Signed)
Pt calling to see what xray results done 5/28. Pt is in a lot of pain. Please advise.

## 2014-06-13 NOTE — Telephone Encounter (Signed)
Pt returning call to [facility] for results. Please advise.

## 2014-06-13 NOTE — Telephone Encounter (Signed)
Returning pt call. I let her know that her xrays were normal. She says that she has a f/u with Dr Aneta Mins next week. The patient expressed understanding and had no further questions.

## 2014-06-13 NOTE — Telephone Encounter (Signed)
Returning pt call. LM.

## 2014-06-19 ENCOUNTER — Ambulatory Visit
Admit: 2014-06-19 | Discharge: 2014-06-19 | Payer: PRIVATE HEALTH INSURANCE | Attending: Internal Medicine | Primary: Family Medicine

## 2014-06-19 DIAGNOSIS — M542 Cervicalgia: Secondary | ICD-10-CM

## 2014-06-19 MED ORDER — OXYCODONE-ACETAMINOPHEN 7.5 MG-325 MG TAB
ORAL_TABLET | Freq: Four times a day (QID) | ORAL | Status: DC | PRN
Start: 2014-06-19 — End: 2014-07-31

## 2014-06-19 MED ORDER — AMITRIPTYLINE 25 MG TAB
25 mg | ORAL_TABLET | Freq: Every evening | ORAL | Status: DC
Start: 2014-06-19 — End: 2014-08-01

## 2014-06-19 NOTE — Progress Notes (Signed)
HPI  Patient has yet to start PT.      Right hip pain-persists.    B/l shoulder pains- persists.      Back pain with b/l sciatica- patient reports trying horizant that resulted in a headache and fatigue.      Review of Systems:  ROS:  History obtained from the patient  ?? General: no acute distress  ?? HEENT: occasional itchy throat  ?? Respiratory: no cough, shortness of breath, or wheezing  ?? Cardiovascular: no chest pain, palpitations, or dyspnea on exertion  ?? Gastrointestinal: no abdominal pain, N/V, change in bowel habits, or black or bloody stools  ?? Musculoskeletal: + neck, back and b/l shoulder pains, right hip pain  ?? Neurological: +numbness, tingling of the upper and lower extremities  ?? Psychological: negative for - anxiety, depression, sleep disturbances, suicidal or homicidal ideations    Past Medical History   Diagnosis Date   ??? Spondylosis    ??? Torticollis    ??? Chronic neck pain        Family History   Problem Relation Age of Onset   ??? Diabetes Mother    ??? Hypertension Mother    ??? Cancer Father      prostate   ??? Hypertension Father        Past Surgical History   Procedure Laterality Date   ??? Hx total abdominal hysterectomy     ??? Hx knee arthroscopy       x4   ??? Hx rotator cuff repair       x2   ??? Hx cervical fusion         History     Social History   ??? Marital Status: SINGLE     Spouse Name: N/A   ??? Number of Children: N/A   ??? Years of Education: N/A     Occupational History   ??? Not on file.     Social History Main Topics   ??? Smoking status: Former Smoker     Quit date: 02/21/1983   ??? Smokeless tobacco: Never Used   ??? Alcohol Use: No   ??? Drug Use: No   ??? Sexual Activity: Not Currently     Other Topics Concern   ??? Not on file     Social History Narrative       Current Outpatient Prescriptions   Medication Sig Dispense Refill   ??? oxyCODONE-acetaminophen (PERCOCET) 7.5-325 mg per tablet Take 1 Tab by mouth every six (6) hours as needed for Pain. Max Daily Amount: 4 Tabs. 40 Tab 0    ??? amitriptyline (ELAVIL) 25 mg tablet Take 1 Tab by mouth nightly. 30 Tab 0   ??? gabapentin enacarbil (HORIZANT) 300 mg TbER Take 300 mg by mouth nightly. 30 Tab 0   ??? methocarbamol (ROBAXIN-750) 750 mg tablet Take 1 Tab by mouth three (3) times daily as needed. 30 Tab 0   ??? diazepam (VALIUM) 10 mg tablet Take 1 Tab by mouth every six (6) hours as needed for Anxiety. Max Daily Amount: 40 mg. Indications: ANXIETY, MUSCLE SPASM 30 Tab 0   ??? budesonide-formoterol (SYMBICORT) 160-4.5 mcg/actuation HFA inhaler Take 2 Puffs by inhalation two (2) times a day.     ??? gabapentin enacarbil (HORIZANT) 300 mg TbER Take 300 mg by mouth nightly. 30 Tab 0   ??? estradiol (ESTRACE) 2 mg tablet Take 2 mg by mouth daily.     ??? estradiol (VAGIFEM) 10 mcg tab vaginal tablet Insert 10 mcg  into vagina. Three times weekly.     ??? multivitamin (ONE A DAY) tablet Take 1 Tab by mouth daily.     ??? BIOTIN PO Take 5,000 mg by mouth daily.     ??? GLUCOSAMINE-CONDROITIN-HRB#182 PO Take 1 Tab by mouth daily.     ??? Cholecalciferol, Vitamin D3, (VITAMIN D3) 2,000 unit cap capsule Take 2,000 Units by mouth daily. 90 Cap 2       Allergies   Allergen Reactions   ??? Latex Rash   ??? Shellfish Derived Anaphylaxis   ??? Anaprox [Naproxen Sodium] Other (comments)     GI Bleeding   ??? Celebrex [Celecoxib] Other (comments)     GI Bleeding   ??? Codeine Itching and Nausea Only   ??? Doxycycline Unknown (comments)   ??? Motrin [Ibuprofen] Other (comments)     GI Bleeding   ??? Naprosyn [Naproxen] Other (comments)     GI Bleeding   ??? Neurontin [Gabapentin] Unknown (comments)   ??? Norco [Hydrocodone-Acetaminophen] Other (comments)     Hair Sheds   ??? Nsaids (Non-Steroidal Anti-Inflammatory Drug) Other (comments)     Gastric bleed   ??? Singulair [Montelukast] Other (comments)     Caused Fainting   ??? Strawberry Hives and Nausea and Vomiting   ??? Sulfamethoxazole-Trimethoprim Other (comments)     Increases pain   ??? Tramadol Other (comments)     confusion       Physical Examination:    BP 141/89 mmHg   Pulse 79   Temp(Src) 98.1 ??F (36.7 ??C) (Oral)   Resp 18   Ht 5' 2.5" (1.588 m)   Wt 161 lb 12.8 oz (73.392 kg)   BMI 29.10 kg/m2   SpO2 100%    General appearance - alert, well appearing, and in no distress  Mental status - alert, oriented to person, place, and time  PULM: clear to auscultation, no wheezes, rales or rhonchi, symmetric air entry  CVS - normal rate, regular rhythm, normal S1, S2, no murmurs, rubs, clicks or gallops  Musculoskeletal - tenderness to palpation of the b/l shoulders with the left being the worse.  ROM is somewhat limited of the left shoulder.  Medial aspect of left shoulder appears to be swollen    Asessment/ Plan    1. Cervical pain (neck) with radiculopathy  - oxyCODONE-acetaminophen (PERCOCET) 7.5-325 mg per tablet; Take 1 Tab by mouth every six (6) hours as needed for Pain. Max Daily Amount: 4 Tabs.  Dispense: 40 Tab; Refill: 0  - amitriptyline (ELAVIL) 25 mg tablet; Take 1 Tab by mouth nightly.  Dispense: 30 Tab; Refill: 0  - awaiting PT    2. Acute bilateral low back pain with bilateral sciatica  - oxyCODONE-acetaminophen (PERCOCET) 7.5-325 mg per tablet; Take 1 Tab by mouth every six (6) hours as needed for Pain. Max Daily Amount: 4 Tabs.  Dispense: 40 Tab; Refill: 0  - amitriptyline (ELAVIL) 25 mg tablet; Take 1 Tab by mouth nightly.  Dispense: 30 Tab; Refill: 0  - awaiting PT    Discussed with the patient the importance of starting Physical therapy.  She has agreed to schedule an appointment with [facility].      Plan of care has been discussed with the patient and she is in agreement.    Dyann Ruddle, MD

## 2014-06-19 NOTE — Progress Notes (Signed)
Pt is here for body pain. Pt states her left elbow hurts and right hip shoots down to foot.     1. Have you been to the ER, urgent care clinic since your last visit?  Hospitalized since your last visit?No    2. Have you seen or consulted any other health care providers outside of the [facility] since your last visit?  Include any pap smears or colon screening. No

## 2014-06-24 ENCOUNTER — Encounter

## 2014-06-25 ENCOUNTER — Encounter: Primary: Family Medicine

## 2014-06-26 NOTE — Telephone Encounter (Signed)
[facility] calling to check med refill. Please advise.

## 2014-06-27 ENCOUNTER — Encounter

## 2014-06-27 MED ORDER — CHOLECALCIFEROL (VITAMIN D3) 2,000 UNIT CAPSULE
ORAL_CAPSULE | Freq: Every day | ORAL | Status: DC
Start: 2014-06-27 — End: 2014-12-12

## 2014-06-28 ENCOUNTER — Telehealth

## 2014-06-28 NOTE — Telephone Encounter (Signed)
Pt calling requesting an MRI of her left elbow. Please advise.

## 2014-07-01 NOTE — Telephone Encounter (Signed)
Please ask the patient if she has scheduled her physical therapy as this was intended to help her pain.

## 2014-07-02 NOTE — Telephone Encounter (Signed)
Please let the patient know that I am out of the office and will not be back until next week.  Please let her also know that sometimes insurances ask what conservative efforts (that is physical therapy) we have tried before orderning an MRI.  I will place an Ortho consult so she can be evaluated for a possible steroid injection

## 2014-07-02 NOTE — Addendum Note (Signed)
Addended by: Candelaria CelestePHILLIP, Andee Chivers A on: 07/02/2014 09:42 AM      Modules accepted: Orders

## 2014-07-02 NOTE — Progress Notes (Signed)
Addendum created to place referral to Ortho

## 2014-07-02 NOTE — Telephone Encounter (Signed)
Pt is calling to follow up on MRI request. Pt stated that her left elbow is hurting a lot and not able to even move her arm. Pt has an appt scheduled for PT next week. Please advise.

## 2014-07-03 NOTE — Telephone Encounter (Signed)
Rec'd inbound call from the patient advised of the below stated information and per the patient stated tht she will go to orth for a consult however  stated tht she has already been through steroid injections for her knee and it was not effective.  Patient stated tht she is to start PT on 07/10/14.  Advised patient would inform provider upon his return and someone would call her with her appt time and date of ortho appt.  Pt confirmed understanding.  Closing encounter

## 2014-07-03 NOTE — Telephone Encounter (Signed)
Called patient to advised of the below stated information.  LVM for patient to return call.

## 2014-07-15 ENCOUNTER — Encounter: Primary: Family Medicine

## 2014-07-18 ENCOUNTER — Inpatient Hospital Stay
Admit: 2014-07-18 | Payer: PRIVATE HEALTH INSURANCE | Attending: Rehabilitative and Restorative Service Providers" | Primary: Family Medicine

## 2014-07-18 DIAGNOSIS — M545 Low back pain: Secondary | ICD-10-CM

## 2014-07-19 NOTE — Progress Notes (Signed)
[facility] ??? [facility] PHYSICAL THERAPY  [address], [place], [place] [address] - Phone: [phone]  Fax: [phone]  PLAN OF CARE / STATEMENT OF MEDICAL NECESSITY FOR PHYSICAL THERAPY SERVICES  Patient Name: Kelsey Bradley DOB: 04-Jun-1960   Medical   Diagnosis: Cervical and lumbar radiculopathy Treatment Diagnosis: Bilateral low back pain [M54.5]   Onset Date: Chronic      Referral Source: Dyann Ruddle, MD Start of Care [facility]): 07/19/2014   Prior Hospitalization: See medical history Provider #: [id number]   Prior Level of Function: Sedentary, NO UE limitation, lifting limited by back pain   Comorbidities: C/s fusion, latex allergy   Medications: Verified on Patient Summary List   The Plan of Care and following information is based on the information from the initial evaluation.   ===========================================================================================  Assessment / key information:  61 F arrives to clinic with c/o of neck and L UE pain to hand, r sided low back pain that radiates to anterior thigh. She had a cervical fusion in 2014 due to L UE pain/sx but pt unaware of which spinal levels, surgeon or any other medical interventions in her neck. She states this pain has progressed over the past several months and reports L UE is now constant but varies pain levels depending on use 7-10/10. She was a very poor historian in regards to medical history and unable to clarify in her chart. Possible epidural in ls, meds and pain management. Current findings: c/s arom limited in ext, rr, L SB by 50%, all others 25%. Shoulder and elbow arom wnl and special tests of both negative, +ultt following median and radial nerve, c6-t1 weakness noted on L, elevated 1st rib, significant reduction in rib and thoracic 1-3 pa/ap mobility, ttp along L pecs, ut, scalenes and scm. Unable to accurately assess c/s due to pain. ls arom limited in flex and L SB 50%, demo no  directional preference. -slump, slr B, +FABERs on R. At this time remaining evaluation was ceased to due to pt crying and reporting increase in pain. Will attempt to assess any further ls involvement as tolerated. Will attempt PT in order to address problem list below and restore her overall function.   ===========================================================================================  Problem List: pain affecting function, decrease ROM, decrease strength, decrease ADL/ functional abilitiies, decrease activity tolerance, decrease flexibility/ joint mobility and decrease transfer abilities   Treatment Plan may include any combination of the following: Therapeutic exercise, Therapeutic activities, Neuromuscular re-education, Physical agent/modality, Gait/balance training, Manual therapy, Patient education, Self Care training and Functional mobility training  Patient / Family readiness to learn indicated by: asking questions, trying to perform skills and interest  Persons(s) to be included in education: patient (P)  Barriers to Learning/Limitations: yes;  other decreased memory with PMH   Measures taken: Contact MD, advised pt to bring in copies of op reports   Patient Goal (s): Decrease pain   Patient self reported health status: good  Rehabilitation Potential: fair  ? Short Term Goals: To be accomplished in  2  weeks:  1. Pt will be compliant with hep  2. Pt will be able to perform c/s fnod in supine in order to increase c/s arom for reading  3. Pt will note daily max pain </=8/10 in L UE in order to increase participation in adls  ? Long Term Goals: To be accomplished in  4  weeks:  1. Pt will increase FOTO by 2 points in order to  show functional improvement  2. Pt will note daily max pain </=6/10 in L UE in order to increase participation in adls  3. Pt will increase ls arom by 25% in all directions in order to A with twisting/turning  Frequency / Duration:   Patient to be seen  2  times per week for 4   weeks:  Patient / Caregiver education and instruction: self care and activity modification  G-Codes (GP): .Carry [id number] Current  CL= 60-79%   [id number] Goal  CJ= 20-39%.  The severity rating is based on the FOTO Score     Therapist Signature: Debbora PrestoKatie  Arty Bradley, PT Date: 07/19/2014   Certification Period: 07/19/14-10/14/14 Time: 8:57 AM   ===========================================================================================  I certify that the above Physical Therapy Services are being furnished while the patient is under my care.  I agree with the treatment plan and certify that this therapy is necessary.    Physician Signature:        Date:       Time:     Please sign and [facility] or you may fax the signed copy to [phone].  Thank you.

## 2014-07-19 NOTE — Progress Notes (Signed)
.    PHYSICAL THERAPY - DAILY TREATMENT NOTE    Patient Name: Kelsey Bradley        Date: 07/19/2014  DOB: 08/11/1960   yes Patient DOB Verified  Visit #:   1   of   8  Insurance: Payor: [place] MED[place]ARE / Plan: [place] MEDICARE PART A & B / Product Type: Medicare /      In time: 5:50 Out time: 6:30   Total Treatment Time: 40     Medicare Time Tracking (below)   Total Timed Codes (min):  0 1:1 Treatment Time:  0     TREATMENT AREA =  Bilateral low back pain [M54.5]    SUBJECTIVE  Pain Level (on 0 to 10 scale):  ie  / 10   Medication Changes/New allergies or changes in medical history, any new surgeries or procedures?    no  If yes, update Summary List   Subjective Functional Status/Changes:    No changes reported     See ie          OBJECTIVE  Rationale:         min Patient Education:  no  Reviewed HEP     Progressed/Changed HEP based on:       Other Objective/Functional Measures:    Due to pt inability to accurately present PMH and high pain levels with all movements held hep until consult with md  See ie     Post Treatment Pain Level (on 0 to 10) scale:   ie  / 10     ASSESSMENT  Assessment/Changes in Function:     See ie       See Progress Note/Recertification   Patient will continue to benefit from skilled PT services to modify and progress therapeutic interventions, address functional mobility deficits, address ROM deficits, address strength deficits, analyze and address soft tissue restrictions, analyze and cue movement patterns, analyze and modify body mechanics/ergonomics, assess and modify postural abnormalities and instruct in home and community integration to attain remaining goals.   Progress toward goals / Updated goals:    See ie     PLAN    Upgrade activities as tolerated yes Continue plan of care     Discharge due to :      Other:      Therapist: Debbora PrestoKatie  Seleen Walter, PT    Date: 07/19/2014 Time: 8:38 AM     Future Appointments  Date Time Provider Department Center   07/22/2014 5:00 PM Esmond CamperSeth B Gibson, PTA [facility]    07/24/2014 5:00 PM Esmond CamperSeth B Gibson, PTA [facility]   07/29/2014 5:00 PM Esmond CamperSeth B Gibson, PTA [facility]   07/31/2014 5:00 PM Esmond CamperSeth B Gibson, PTA [facility]

## 2014-07-22 ENCOUNTER — Encounter: Payer: PRIVATE HEALTH INSURANCE | Primary: Family Medicine

## 2014-07-22 NOTE — Telephone Encounter (Signed)
Patient is calling to let us know [facility] .

## 2014-07-22 NOTE — Telephone Encounter (Signed)
Patient reports that Saturday morning when she got up, she had a horrible headache on the right side of her face on her cheek all the way up to the top of her head. She states that she was numb to touch. The symptoms subsided and have now started to come back this morning. Patient is again experiencing the right sided facial numbness. Advised patient that she needs to go directly to the ER. 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 She will call her boss and then have her son come get her. Advised patient to call us to advise us when she leaves to go to the ER.

## 2014-07-24 ENCOUNTER — Inpatient Hospital Stay: Admit: 2014-07-24 | Payer: PRIVATE HEALTH INSURANCE | Primary: Family Medicine

## 2014-07-24 NOTE — Progress Notes (Signed)
PHYSICAL THERAPY - DAILY TREATMENT NOTE    Patient Name: Kelsey Bradley        Date: 07/24/2014  DOB: 08/25/1960   YES Patient DOB Verified  Visit #:   2   of   12  Insurance: Payor: [place] MEDICARE / Plan: [place] MEDICARE PART A & B / Product Type: Medicare /      In time: 5 Out time: 6   Total Treatment Time: 60     Medicare Time Tracking (below)   Total Timed Codes (min):  50 1:1 Treatment Time:  40     TREATMENT AREA =  Bilateral low back pain [M54.5]    SUBJECTIVE  Pain Level (on 0 to 10 scale):  8  / 10   Medication Changes/New allergies or changes in medical history, any new surgeries or procedures?    NO    If yes, update Summary List   Subjective Functional Status/Changes:    No changes reported     Pt reporting constant pain into the (L) UE.   Pt provided release from ER department clearing her for return to PT following her incident with the ER over the last few days.        OBJECTIVE  Modalities Rationale:     decrease pain and increase tissue extensibility to improve patient's ability to perform pain free ADLs.    10 min  Estim, type/location: IFC to cervical paraspinals.                                        att       unatt       w/US       w/ice      w/heat    min   Mechanical Traction: type/lbs                     pro     sup     int     cont      before manual      after manual    min   Ultrasound, settings/location:      min   Iontophoresis w/ dexamethasone, location:                                                 take home patch         in clinic    min   Ice       Heat    location/position:     min   Vasopneumatic Device, press/temp:     min   Other:     Skin assessment post-treatment (if applicable):      intact      redness- no adverse reaction     redness ??? adverse reaction:        25 (15)  min Therapeutic Exercise:    See flow sheet   Rationale:      increase ROM, increase strength and improve coordination to improve the patient???s ability to perform pain free ADLs.       15 min Manual Therapy: STM/DTM and TPR to (L) c/s paraspinals, UT/scalenes, and (L) periscapular mms.    Rationale:      decrease pain, increase ROM,  increase tissue extensibility and decrease trigger points to improve patient's ability to perform pain free ADLs.      min Patient Education:  YES  Reviewed HEP     Progressed/Changed HEP based on:        Other Objective/Functional Measures:    Added multiple exercises to improve strength and stability for ADLs.  See flow sheet.          Post Treatment Pain Level (on 0 to 10) scale:   8  / 10     ASSESSMENT  Assessment/Changes in Function:     Able to tolerate initial exercises.  No change in symptoms throughout session.        See Progress Note/Recertification   Patient will continue to benefit from skilled PT services to modify and progress therapeutic interventions, address functional mobility deficits, address ROM deficits and address strength deficits to attain remaining goals.   Progress toward goals / Updated goals:    Initiated therex.      PLAN    Upgrade activities as tolerated YES Continue plan of care     Discharge due to :      Other:      Therapist: Esmond Camper, PTA    Date: 07/24/2014 Time: 6:53 PM     Future Appointments  Date Time Provider Department Center   07/29/2014 5:00 PM Esmond Camper, PTA [facility]   07/31/2014 5:00 PM Esmond Camper, PTA [facility]   07/31/2014 5:15 PM Dyann Ruddle, MD [facility] SCHED   09/02/2014 5:00 PM Esmond Camper, PTA [facility]   09/04/2014 5:00 PM Esmond Camper, PTA [facility]   09/11/2014 5:00 PM Esmond Camper, PTA [facility]   09/16/2014 5:00 PM Esmond Camper, PTA [facility]   09/18/2014 5:00 PM Esmond Camper, PTA [facility]

## 2014-07-26 NOTE — Telephone Encounter (Signed)
Pt calling to request medication refill of oxyCODONE-acetaminophen (PERCOCET) 7.5-325 mg per tablet   be printed. Pt has about 0 tabs remaining. Advised pt of 72 hour time frame for refill requests. Please advise.

## 2014-07-26 NOTE — Telephone Encounter (Signed)
Dr. Aneta Mins,      Please refill.      Thank you,  Dixon Boos LPN

## 2014-07-29 ENCOUNTER — Inpatient Hospital Stay: Admit: 2014-07-29 | Payer: PRIVATE HEALTH INSURANCE | Primary: Family Medicine

## 2014-07-29 NOTE — Progress Notes (Addendum)
PHYSICAL THERAPY - DAILY TREATMENT NOTE    Patient Name: Kelsey Bradley        Date: 07/29/2014  DOB: 01-19-1960   YES Patient DOB Verified  Visit #:   3   of   12  Insurance: Payor: [place] MED[place]ARE / Plan: [place] MEDICARE PART A & B / Product Type: Medicare /      In time: 5 Out time: 610   Total Treatment Time: 70     Medicare Time Tracking (below)   Total Timed Codes (min):  65 1:1 Treatment Time:  40     TREATMENT AREA =  Bilateral low back pain [M54.5]    SUBJECTIVE  Pain Level (on 0 to 10 scale):  4  / 10   Medication Changes/New allergies or changes in medical history, any new surgeries or procedures?    NO    If yes, update Summary List   Subjective Functional Status/Changes:    No changes reported     Pt reports she was sore for about a day after the last session.  States it was painful, but went away 2 days later.        OBJECTIVE  Modalities Rationale:     decrease pain and increase tissue extensibility to improve patient's ability to perform pain free ADLs.     10 min  Estim, type/location: IFC w/ MH. Cervical spine.                                       att       unatt       w/US       w/ice      w/heat    min   Mechanical Traction: type/lbs                     pro     sup     int     cont      before manual      after manual    min   Ultrasound, settings/location:      min   Iontophoresis w/ dexamethasone, location:                                                 take home patch         in clinic    min   Ice       Heat    location/position:     min   Vasopneumatic Device, press/temp:     min   Other:     Skin assessment post-treatment (if applicable):      intact      redness- no adverse reaction     redness ??? adverse reaction:        25 (10)  min Therapeutic Exercise:    See flow sheet   Rationale:      increase ROM, increase strength, improve coordination and improve balance to improve the patient???s ability to perform pain free ADLs.      30 Min Manual Therapy: DTM and TPR to (B) lumbar paraspinals, glute  min/med and piriformis.  SOR.  STM c/s paraspinals.     Rationale:      decrease pain, increase ROM,  increase tissue extensibility and decrease trigger points to improve patient's ability to perform pain free ADLs.      min Patient Education:  YES  Reviewed HEP     Progressed/Changed HEP based on:        Other Objective/Functional Measures:    Added bridges to improve core strength and stability for ADLs.      Post Treatment Pain Level (on 0 to 10) scale:   0   / 10     ASSESSMENT  Assessment/Changes in Function:     Poor tolerance to therex.  Pt reports significant improvement in pain levels following modalities.        See Progress Note/Recertification   Patient will continue to benefit from skilled PT services to modify and progress therapeutic interventions, address functional mobility deficits, address ROM deficits, address strength deficits, analyze and address soft tissue restrictions, analyze and cue movement patterns and analyze and modify body mechanics/ergonomics to attain remaining goals.   Progress toward goals / Updated goals:    Excellent pain response to modalities today.  Progressing toward LTG #2.      PLAN    Upgrade activities as tolerated YES Continue plan of care     Discharge due to :      Other:      Therapist: Esmond Camper, PTA    Date: 07/29/2014 Time: 6:34 PM     Future Appointments  Date Time Provider Department Center   07/31/2014 5:15 PM Dyann Ruddle, MD [facility] SCHED   08/19/2014 5:30 PM Esmond Camper, PTA [facility]   08/21/2014 5:30 PM Esmond Camper, PTA [facility]   08/26/2014 5:30 PM Esmond Camper, PTA [facility]   08/28/2014 5:30 PM Esmond Camper, PTA [facility]   09/02/2014 5:00 PM Esmond Camper, PTA [facility]   09/04/2014 5:00 PM Esmond Camper, PTA [facility]   09/11/2014 5:00 PM Esmond Camper, PTA [facility]   09/16/2014 5:00 PM Esmond Camper, PTA [facility]   09/18/2014 5:00 PM Esmond Camper, PTA [facility]

## 2014-07-31 ENCOUNTER — Encounter: Payer: PRIVATE HEALTH INSURANCE | Primary: Family Medicine

## 2014-07-31 ENCOUNTER — Ambulatory Visit
Admit: 2014-07-31 | Discharge: 2014-07-31 | Payer: PRIVATE HEALTH INSURANCE | Attending: Internal Medicine | Primary: Family Medicine

## 2014-07-31 DIAGNOSIS — M542 Cervicalgia: Secondary | ICD-10-CM

## 2014-07-31 MED ORDER — DIAZEPAM 10 MG TAB
10 mg | ORAL_TABLET | Freq: Four times a day (QID) | ORAL | Status: DC | PRN
Start: 2014-07-31 — End: 2014-08-14

## 2014-07-31 MED ORDER — OXYCODONE-ACETAMINOPHEN 7.5 MG-325 MG TAB
ORAL_TABLET | Freq: Four times a day (QID) | ORAL | Status: DC | PRN
Start: 2014-07-31 — End: 2014-08-14

## 2014-07-31 NOTE — Progress Notes (Signed)
HPI  ER follow up:    Patient presented to the ER on 07/22/2014 for left facial pain and numbness.  No etiology was found after doing a CT Head and blood work.  She denies any slurred speech.   She also reports increased sensitivity of her left scalp and numbness of the tongue.  Patient reports that this lasted for about 3 days before resolving.    Chronic neck and back pains- pt has started PT.  She reports no significant improvement thus far.      Left elbow pain- on going,  She has not followed up with Ortho at this point.      Review of Systems:  ROS:  History obtained from the patient  ?? General: no acute distress  ?? HEENT: occasional itchy throat  ?? Respiratory: no cough, shortness of breath, or wheezing  ?? Cardiovascular: no chest pain, palpitations, or dyspnea on exertion  ?? Gastrointestinal: no abdominal pain, N/V, change in bowel habits, or black or bloody stools  ?? Musculoskeletal: + neck, back and b/l shoulder pains, right hip pain, left elbow pain  ?? Neurological: +numbness, tingling of the upper and lower extremities  ?? Psychological: negative for - anxiety, depression, sleep disturbances, suicidal or homicidal ideations    Past Medical History   Diagnosis Date   ??? Spondylosis    ??? Torticollis    ??? Chronic neck pain        Family History   Problem Relation Age of Onset   ??? Diabetes Mother    ??? Hypertension Mother    ??? Cancer Father      prostate   ??? Hypertension Father        Past Surgical History   Procedure Laterality Date   ??? Hx total abdominal hysterectomy     ??? Hx knee arthroscopy       x4   ??? Hx rotator cuff repair       x2   ??? Hx cervical fusion         History     Social History   ??? Marital Status: SINGLE     Spouse Name: N/A   ??? Number of Children: N/A   ??? Years of Education: N/A     Occupational History   ??? Not on file.     Social History Main Topics   ??? Smoking status: Former Smoker     Quit date: 02/21/1983   ??? Smokeless tobacco: Never Used   ??? Alcohol Use: No   ??? Drug Use: No    ??? Sexual Activity: Not Currently     Other Topics Concern   ??? Not on file     Social History Narrative       Current Outpatient Prescriptions   Medication Sig Dispense Refill   ??? Cholecalciferol, Vitamin D3, (VITAMIN D3) 2,000 unit cap capsule Take 2,000 Units by mouth daily. 90 Cap 2   ??? oxyCODONE-acetaminophen (PERCOCET) 7.5-325 mg per tablet Take 1 Tab by mouth every six (6) hours as needed for Pain. Max Daily Amount: 4 Tabs. 40 Tab 0   ??? budesonide-formoterol (SYMBICORT) 160-4.5 mcg/actuation HFA inhaler Take 2 Puffs by inhalation two (2) times a day.     ??? estradiol (ESTRACE) 2 mg tablet Take 2 mg by mouth daily.     ??? estradiol (VAGIFEM) 10 mcg tab vaginal tablet Insert 10 mcg into vagina. Three times weekly.     ??? multivitamin (ONE A DAY) tablet Take 1 Tab by  mouth daily.     ??? BIOTIN PO Take 5,000 mg by mouth daily.     ??? GLUCOSAMINE-CONDROITIN-HRB#182 PO Take 1 Tab by mouth daily.     ??? amitriptyline (ELAVIL) 25 mg tablet Take 1 Tab by mouth nightly. 30 Tab 0   ??? gabapentin enacarbil (HORIZANT) 300 mg TbER Take 300 mg by mouth nightly. 30 Tab 0   ??? methocarbamol (ROBAXIN-750) 750 mg tablet Take 1 Tab by mouth three (3) times daily as needed. 30 Tab 0   ??? diazepam (VALIUM) 10 mg tablet Take 1 Tab by mouth every six (6) hours as needed for Anxiety. Max Daily Amount: 40 mg. Indications: ANXIETY, MUSCLE SPASM 30 Tab 0   ??? gabapentin enacarbil (HORIZANT) 300 mg TbER Take 300 mg by mouth nightly. 30 Tab 0       Allergies   Allergen Reactions   ??? Latex Rash   ??? Shellfish Derived Anaphylaxis   ??? Anaprox [Naproxen Sodium] Other (comments)     GI Bleeding   ??? Celebrex [Celecoxib] Other (comments)     GI Bleeding   ??? Codeine Itching and Nausea Only   ??? Doxycycline Unknown (comments)   ??? Motrin [Ibuprofen] Other (comments)     GI Bleeding   ??? Naprosyn [Naproxen] Other (comments)     GI Bleeding   ??? Neurontin [Gabapentin] Unknown (comments)   ??? Norco [Hydrocodone-Acetaminophen] Other (comments)     Hair Sheds    ??? Nsaids (Non-Steroidal Anti-Inflammatory Drug) Other (comments)     Gastric bleed   ??? Singulair [Montelukast] Other (comments)     Caused Fainting   ??? Strawberry Hives and Nausea and Vomiting   ??? Sulfamethoxazole-Trimethoprim Other (comments)     Increases pain   ??? Tramadol Other (comments)     confusion       Physical Examination:   BP 130/81 mmHg   Pulse 67   Temp(Src) 97 ??F (36.1 ??C) (Oral)   Resp 16   Ht 5' 2.52" (1.588 m)   Wt 158 lb (71.668 kg)   BMI 28.42 kg/m2   SpO2 98%    General appearance - alert, well appearing, and in no distress  Mental status - alert, oriented to person, place, and time  PULM: clear to auscultation, no wheezes, rales or rhonchi, symmetric air entry  CVS - normal rate, regular rhythm, normal S1, S2, no murmurs, rubs, clicks or gallops  Musculoskeletal - no tenderness to palpation of the cervical and thoracic area.  Tenderness to palpation of the lumbar area    Asessment/ Plan    1. Cervical pain (neck) with radiculopathy- improved with physical therapy  - oxyCODONE-acetaminophen (PERCOCET) 7.5-325 mg per tablet; Take 1 Tab by mouth every six (6) hours as needed for Pain. Max Daily Amount: 4 Tabs.  Dispense: 60 Tab; Refill: 0  - diazepam (VALIUM) 10 mg tablet; Take 1 Tab by mouth every six (6) hours as needed for Anxiety. Max Daily Amount: 40 mg. Indications: ANXIETY, MUSCLE SPASM  Dispense: 30 Tab; Refill: 0    2. Acute bilateral low back pain with bilateral sciatica  - oxyCODONE-acetaminophen (PERCOCET) 7.5-325 mg per tablet; Take 1 Tab by mouth every six (6) hours as needed for Pain. Max Daily Amount: 4 Tabs.  Dispense: 60 Tab; Refill: 0  - diazepam (VALIUM) 10 mg tablet; Take 1 Tab by mouth every six (6) hours as needed for Anxiety. Max Daily Amount: 40 mg. Indications: ANXIETY, MUSCLE SPASM  Dispense: 30 Tab; Refill: 0 for muscle  spasms  - continue Physical therapy    3. Migraine- thought to be the reason behind her symptoms on 07/22/2014   - possible hemiplegia migraine?  given her symptoms  - trial of imitrex in the event this should recur    Records from ER visit reviewed      Plan of care has been discussed with the patient and she is in agreement.    Dyann Ruddle, MD

## 2014-07-31 NOTE — Progress Notes (Signed)
1. Have you been to the ER, urgent care clinic since your last visit?  Hospitalized since your last visit?yes 07/22/14 [facility] right facial pain/numbness    2. Have you seen or consulted any other health care providers outside of the [facility] since your last visit?  Include any pap smears or colon screening. No    Patient Kelsey Bradley is a 54 y.o. female presents today for hospital f/u and headache.

## 2014-07-31 NOTE — Patient Instructions (Signed)
Chronic Pain: Care Instructions  Your Care Instructions  Chronic pain is pain that lasts a long time (months or even years) and may or may not have a clear cause. It is different from acute pain, which usually does have a clear cause???like an injury or illness???and gets better over time. Chronic pain:  ?? Lasts over time but may vary from day to day.  ?? Does not go away despite efforts to end it.  ?? May disrupt your sleep and lead to fatigue.  ?? May cause depression or anxiety.  ?? May make your muscles tense, causing more pain.  ?? Can disrupt your work, hobbies, home life, and relationships with friends and family.  Chronic pain is a very real condition. It is not just in your head. Treatment can help and usually includes several methods used together, such as medicines, physical therapy, exercise, and other treatments. Learning how to relax and changing negative thought patterns can also help you cope.  Chronic pain is complex. Taking an active role in your treatment will help you better manage your pain. Tell your doctor if you have trouble dealing with your pain. You may have to try several things before you find what works best for you.  Follow-up care is a key part of your treatment and safety. Be sure to make and go to all appointments, and call your doctor if you are having problems. It???s also a good idea to know your test results and keep a list of the medicines you take.  How can you care for yourself at home?  ?? Pace yourself. Break up large jobs into smaller tasks. Save harder tasks for days when you have less pain, or go back and forth between hard tasks and easier ones. Take rest breaks.  ?? Relax, and reduce stress. Relaxation techniques such as deep breathing or meditation can help.  ?? Keep moving. Gentle, daily exercise can help reduce pain over the long run. Try low- or no-impact exercises such as walking, swimming, and stationary biking. Do stretches to stay flexible.   ?? Try heat, cold packs, and massage.  ?? Get enough sleep. Chronic pain can make you tired and drain your energy. Talk with your doctor if you have trouble sleeping because of pain.  ?? Think positive. Your thoughts can affect your pain level. Do things that you enjoy to distract yourself when you have pain instead of focusing on the pain. See a movie, read a book, listen to music, or spend time with a friend.  ?? If you think you are depressed, talk to your doctor about treatment.  ?? Keep a daily pain diary. Record how your moods, thoughts, sleep patterns, activities, and medicine affect your pain. You may find that your pain is worse during or after certain activities or when you are feeling a certain emotion. Having a record of your pain can help you and your doctor find the best ways to treat your pain.  ?? Take pain medicines exactly as directed.  ?? If the doctor gave you a prescription medicine for pain, take it as prescribed.  ?? If you are not taking a prescription pain medicine, ask your doctor if you can take an over-the-counter medicine.  Reducing constipation caused by pain medicine  ?? Include fruits, vegetables, beans, and whole grains in your diet each day. These foods are high in fiber.  ?? Drink plenty of fluids, enough so that your urine is light yellow or clear like water. If you   have kidney, heart, or liver disease and have to limit fluids, talk with your doctor before you increase the amount of fluids you drink.  ?? If your doctor recommends it, get more exercise. Walking is a good choice. Bit by bit, increase the amount you walk every day. Try for at least 30 minutes on most days of the week.  ?? Schedule time each day for a bowel movement. A daily routine may help. Take your time and do not strain when having a bowel movement.  When should you call for help?  Call your doctor now or seek immediate medical care if:  ?? Your pain gets worse or is out of control.   ?? You feel down or blue, or you do not enjoy things like you once did. You may be depressed, which is common in people with chronic pain. Depression can be treated.  ?? You have vomiting or cramps for more than 2 hours.  Watch closely for changes in your health, and be sure to contact your doctor if:  ?? You cannot sleep because of pain.  ?? You are very worried or anxious about your pain.  ?? You have trouble taking your pain medicine.  ?? You have any concerns about your pain medicine.  ?? You have trouble with bowel movements, such as:  ?? No bowel movement in 3 days.  ?? Blood in the anal area, in your stool, or on the toilet paper.  ?? Diarrhea for more than 24 hours.  Where can you learn more?  Go to http://www.healthwise.net/[employer]Help[employer]ctions  Enter N004 in the search box to learn more about "Chronic Pain: Care Instructions."  ?? 2006-2016 [employer]. Care instructions adapted under license by [employer] (which disclaims liability or warranty for this information). This care instruction is for use with your licensed healthcare professional. If you have questions about a medical condition or this instruction, always ask your healthcare professional. Healthwise, Incorporated disclaims any warranty or liability for your use of this information.  Content Version: 10.9.538570; Current as of: February 22, 2014

## 2014-08-01 MED ORDER — SUMATRIPTAN 50 MG TAB
50 mg | ORAL_TABLET | ORAL | Status: AC
Start: 2014-08-01 — End: ?

## 2014-08-05 ENCOUNTER — Inpatient Hospital Stay: Admit: 2014-08-05 | Payer: PRIVATE HEALTH INSURANCE | Primary: Family Medicine

## 2014-08-05 DIAGNOSIS — M545 Low back pain: Secondary | ICD-10-CM

## 2014-08-05 NOTE — Progress Notes (Signed)
PHYSICAL THERAPY - DAILY TREATMENT NOTE    Patient Name: Kelsey Bradley        Date: 08/05/2014  DOB: [birth year]   YES Patient DOB Verified  Visit #:   4   of   12  Insurance: Payor: [place] MEDICARE / Plan: [place] MEDICARE PART A & B / Product Type: Medicare /      In time: 510 Out time: 555   Total Treatment Time: 45     Medicare Time Tracking (below)   Total Timed Codes (min):  45 1:1 Treatment Time:  30     TREATMENT AREA =  Bilateral low back pain [M54.5]    SUBJECTIVE  Pain Level (on 0 to 10 scale):  6-7  / 10   Medication Changes/New allergies or changes in medical history, any new surgeries or procedures?    NO    If yes, update Summary List   Subjective Functional Status/Changes:    No changes reported     Pt reports she thought she had an appointment today (not scheduled until next week).  Able to treat due to opening in schedule. States the pain into the (L) elbow is constant.          OBJECTIVE  Modalities Rationale:     decrease pain and increase tissue extensibility to improve patient's ability to perform pain free ADLs.    10 min  Estim, type/location: Pre-Mod (B) c/s paraspinals in supine.                                        att       unatt       w/US       w/ice      w/heat    min   Mechanical Traction: type/lbs                     pro     sup     int     cont      before manual      after manual    min   Ultrasound, settings/location:      min   Iontophoresis w/ dexamethasone, location:                                                 take home patch         in clinic    min   Ice       Heat    location/position:     min   Vasopneumatic Device, press/temp:     min   Other:     Skin assessment post-treatment (if applicable):      intact      redness- no adverse reaction     redness ??? adverse reaction:        10 (5) NB  min Therapeutic Exercise:    See flow sheet   Rationale:      increase ROM, increase strength and improve coordination to improve the patient???s ability to perform pain free ADLs.       25 Min Manual Therapy: STM (B) c/s paraspinals.  Gentle SOR.  TPR (L) scalenes.  TPR and DTM (R) glute max/med and piriformis.  Rationale:      decrease pain, increase ROM, increase tissue extensibility and decrease trigger points to improve patient's ability to perform pain free ADLs.      min Patient Education:  YES  Reviewed HEP     Progressed/Changed HEP based on:        Other Objective/Functional Measures:    Added prone hip ext to improve core strength and stability for ADLs.      Post Treatment Pain Level (on 0 to 10) scale:   0  / 10     ASSESSMENT  Assessment/Changes in Function:     Good tolerance to treatment.         See Progress Note/Recertification   Patient will continue to benefit from skilled PT services to modify and progress therapeutic interventions, address functional mobility deficits, address ROM deficits, address strength deficits, analyze and address soft tissue restrictions and analyze and cue movement patterns to attain remaining goals.   Progress toward goals / Updated goals:    Minimal change in progress toward LTG's with today's session.      PLAN    Upgrade activities as tolerated YES Continue plan of care     Discharge due to :      Other:      Therapist: Esmond Camper, PTA    Date: 08/05/2014 Time: 6:39 PM     Future Appointments  Date Time Provider Department Center   08/14/2014 5:00 PM Dyann Ruddle, MD [facility] SCHED   08/19/2014 5:30 PM Esmond Camper, PTA [facility]   08/21/2014 5:30 PM Esmond Camper, PTA [facility]   08/26/2014 5:30 PM Esmond Camper, PTA [facility]   08/28/2014 5:30 PM Esmond Camper, PTA [facility]   09/02/2014 5:00 PM Esmond Camper, PTA [facility]   09/04/2014 5:00 PM Esmond Camper, PTA [facility]   09/11/2014 5:00 PM Esmond Camper, PTA [facility]   09/16/2014 5:00 PM Esmond Camper, PTA [facility]   09/18/2014 5:00 PM Esmond Camper, PTA [facility]

## 2014-08-14 ENCOUNTER — Ambulatory Visit
Admit: 2014-08-14 | Discharge: 2014-08-14 | Payer: PRIVATE HEALTH INSURANCE | Attending: Internal Medicine | Primary: Family Medicine

## 2014-08-14 ENCOUNTER — Encounter: Attending: Internal Medicine | Primary: Family Medicine

## 2014-08-14 DIAGNOSIS — M542 Cervicalgia: Secondary | ICD-10-CM

## 2014-08-14 MED ORDER — OXYCODONE-ACETAMINOPHEN 7.5 MG-325 MG TAB
ORAL_TABLET | Freq: Four times a day (QID) | ORAL | Status: AC | PRN
Start: 2014-08-14 — End: ?

## 2014-08-14 MED ORDER — DIAZEPAM 10 MG TAB
10 mg | ORAL_TABLET | Freq: Four times a day (QID) | ORAL | Status: DC | PRN
Start: 2014-08-14 — End: 2014-10-04

## 2014-08-14 NOTE — Progress Notes (Signed)
HPI    Left facial numbnes- pt reports one other episode of left facial numbness.  She reports that it was less intense and lasted for a shorter period.      Chronic neck and back pains- persists.  She reports that physical therapy has been helping.      Left elbow pain- has not been able to see Orthopedics.  Her pain persists and continues to radiate up her left upper extremity    Review of Systems:  ROS:  History obtained from the patient  ?? General: no acute distress  ?? HEENT: occasional itchy throat  ?? Respiratory: no cough, shortness of breath, or wheezing  ?? Cardiovascular: no chest pain, palpitations, or dyspnea on exertion  ?? Gastrointestinal: no abdominal pain, N/V, change in bowel habits, or black or bloody stools  ?? Musculoskeletal: + neck, back and b/l shoulder pains, right hip pain, left elbow pain  ?? Neurological: + left facial numbness,   ?? Psychological: negative for - anxiety, depression, sleep disturbances, suicidal or homicidal ideations    Past Medical History   Diagnosis Date   ??? Chronic neck pain    ??? Spondylosis    ??? Torticollis        Family History   Problem Relation Age of Onset   ??? Diabetes Mother    ??? Hypertension Mother    ??? Cancer Father      prostate   ??? Hypertension Father        Past Surgical History   Procedure Laterality Date   ??? Hx total abdominal hysterectomy     ??? Hx knee arthroscopy       x4   ??? Hx rotator cuff repair       x2   ??? Hx cervical fusion         Social History     Social History   ??? Marital status: SINGLE     Spouse name: N/A   ??? Number of children: N/A   ??? Years of education: N/A     Occupational History   ??? Not on file.     Social History Main Topics   ??? Smoking status: Former Smoker     Quit date: 02/21/1983   ??? Smokeless tobacco: Never Used   ??? Alcohol use No   ??? Drug use: No   ??? Sexual activity: Not Currently     Other Topics Concern   ??? Not on file     Social History Narrative       Current Outpatient Prescriptions   Medication Sig Dispense Refill    ??? oxyCODONE-acetaminophen (PERCOCET) 7.5-325 mg per tablet Take 1 Tab by mouth every six (6) hours as needed for Pain. Max Daily Amount: 4 Tabs. 60 Tab 0   ??? diazepam (VALIUM) 10 mg tablet Take 1 Tab by mouth every six (6) hours as needed for Anxiety. Max Daily Amount: 40 mg. Indications: ANXIETY, MUSCLE SPASM 30 Tab 0   ??? SUMAtriptan (IMITREX) 50 mg tablet Take one tab at the start of your migraine. May repeat every 2 hours, not to exceed 4 tabs in 24 hours 10 Tab 0   ??? Cholecalciferol, Vitamin D3, (VITAMIN D3) 2,000 unit cap capsule Take 2,000 Units by mouth daily. 90 Cap 2   ??? budesonide-formoterol (SYMBICORT) 160-4.5 mcg/actuation HFA inhaler Take 2 Puffs by inhalation two (2) times a day.     ??? estradiol (ESTRACE) 2 mg tablet Take 2 mg by mouth daily.     ???  estradiol (VAGIFEM) 10 mcg tab vaginal tablet Insert 10 mcg into vagina. Three times weekly.     ??? multivitamin (ONE A DAY) tablet Take 1 Tab by mouth daily.     ??? BIOTIN PO Take 5,000 mg by mouth daily.     ??? GLUCOSAMINE-CONDROITIN-HRB#182 PO Take 1 Tab by mouth daily.         Allergies   Allergen Reactions   ??? Latex Rash   ??? Shellfish Derived Anaphylaxis   ??? Anaprox [Naproxen Sodium] Other (comments)     GI Bleeding   ??? Celebrex [Celecoxib] Other (comments)     GI Bleeding   ??? Codeine Itching and Nausea Only   ??? Doxycycline Unknown (comments)   ??? Motrin [Ibuprofen] Other (comments)     GI Bleeding   ??? Naprosyn [Naproxen] Other (comments)     GI Bleeding   ??? Neurontin [Gabapentin] Unknown (comments)   ??? Norco [Hydrocodone-Acetaminophen] Other (comments)     Hair Sheds   ??? Nsaids (Non-Steroidal Anti-Inflammatory Drug) Other (comments)     Gastric bleed   ??? Singulair [Montelukast] Other (comments)     Caused Fainting   ??? Strawberry Hives and Nausea and Vomiting   ??? Sulfamethoxazole-Trimethoprim Other (comments)     Increases pain   ??? Tramadol Other (comments)     confusion       Physical Examination:   Visit Vitals   ??? BP 139/78   ??? Pulse 60    ??? Temp 96.4 ??F (35.8 ??C) (Oral)   ??? Resp 16   ??? Ht 5' 2.5" (1.588 m)   ??? Wt 158 lb 6.4 oz (71.8 kg)   ??? SpO2 100%   ??? BMI 28.51 kg/m2       General appearance - alert, well appearing, and in no distress  Mental status - alert, oriented to person, place, and time  PULM: clear to auscultation, no wheezes, rales or rhonchi, symmetric air entry  CVS - normal rate, regular rhythm, normal S1, S2, no murmurs, rubs, clicks or gallops  Musculoskeletal - tenderness to palpation from the cervical to the lumbar area (drastic change from her last exam).  Multiple areas of muscle spasms noted    Asessment/ Plan    1. Cervical pain (neck) with radiculopathy (pt with h/o C6/C7 anterior fusion and C3/C4 disc herniation)  - generally improved with physical therapy but pt is more tender today  - oxyCODONE-acetaminophen (PERCOCET) 7.5-325 mg per tablet; Take 1 Tab by mouth every six (6) hours as needed for Pain. Max Daily Amount: 4 Tabs.  Dispense: 60 Tab; Refill: 0  - diazepam (VALIUM) 10 mg tablet; Take 1 Tab by mouth every six (6) hours as needed for Anxiety. Max Daily Amount: 40 mg. Indications: ANXIETY, MUSCLE SPASM  Dispense: 30 Tab; Refill: 0  - REFERRAL TO PAIN MANAGEMENT    2. Acute bilateral low back pain with bilateral sciatica (multiple pathology noted at L4/L5 and L5/S1 (L5 nerve root impingement)  - oxyCODONE-acetaminophen (PERCOCET) 7.5-325 mg per tablet; Take 1 Tab by mouth every six (6) hours as needed for Pain. Max Daily Amount: 4 Tabs.  Dispense: 60 Tab; Refill: 0  - REFERRAL TO PAIN MANAGEMENT  - diazepam (VALIUM) 10 mg tablet; Take 1 Tab by mouth every six (6) hours as needed for Anxiety. Max Daily Amount: 40 mg. Indications: ANXIETY, MUSCLE SPASM  Dispense: 30 Tab; Refill: 0    3. Left elbow pain- unclear etiology  - pt awaiting ortho consult  Plan of care has been discussed with the patient and she is in agreement.  Patient has been counseled on the importance of physical therapy.  She has  been referred to pain management.    Dyann Ruddle, MD

## 2014-08-14 NOTE — Progress Notes (Signed)
Kelsey Bradley is a 54 y.o. female following up on neck pain. Pt states that she hasn't been able to be seen by orthopedics or PT.

## 2014-08-19 ENCOUNTER — Encounter: Payer: PRIVATE HEALTH INSURANCE | Primary: Family Medicine

## 2014-08-21 ENCOUNTER — Encounter: Payer: PRIVATE HEALTH INSURANCE | Primary: Family Medicine

## 2014-08-21 NOTE — Telephone Encounter (Signed)
Patient has been seen following ED visit. CLosing encounter.

## 2014-08-26 ENCOUNTER — Inpatient Hospital Stay: Admit: 2014-08-26 | Payer: PRIVATE HEALTH INSURANCE | Primary: Family Medicine

## 2014-08-26 NOTE — Progress Notes (Signed)
PHYSICAL THERAPY - DAILY TREATMENT NOTE    Patient Name: Kelsey Bradley        Date: 08/26/2014  DOB: 02/16/1960   YES Patient DOB Verified  Visit #:   5   of   12  Insurance: [place] / Plan: [place] & B / Product Type: Medicare /      In time: 530 Out time: 620   Total Treatment Time: 50     Medicare Time Tracking (below)   Total Timed Codes (min):  40 1:1 Treatment Time:       TREATMENT AREA =  Bilateral low back pain [M54.5]    SUBJECTIVE  Pain Level (on 0 to 10 scale):  5-6  / 10   Medication Changes/New allergies or changes in medical history, any new surgeries or procedures?    NO    If yes, update Summary List   Subjective Functional Status/Changes:    No changes reported     Pt reports pain has been inconsistent, going to either elbow without specific cause.  Reports pain, when occurring, goes straight to elbow, no line of pain from the neck down.           OBJECTIVE  Modalities Rationale:     decrease inflammation, decrease pain and increase tissue extensibility to improve patient's ability to perform pain free ADLs.    min  Estim, type/location:                                        att       unatt       w/US       w/ice      w/heat    min   Mechanical Traction: type/lbs                     pro     sup     int     cont      before manual      after manual    min   Ultrasound, settings/location:      min   Iontophoresis w/ dexamethasone, location:                                                 take home patch         in clinic   10 min   Ice       Heat    location/position: Supine, cervical.     min   Vasopneumatic Device, press/temp:     min   Other:     Skin assessment post-treatment (if applicable):      intact      redness- no adverse reaction     redness ??? adverse reaction:        15 min Therapeutic Exercise:    See flow sheet   Rationale:      increase ROM, increase strength and improve coordination to improve the patient???s ability to perform pain free ADLs.       25 Min Manual Therapy: StM/DTM and TPR to (B) cervical paraspinals, (R) UT/Lev scap.  SOR/manual traction. .    Rationale:      decrease pain, increase ROM, increase  tissue extensibility and decrease trigger points  to improve patient's ability to perform pain free ADLs.      min Patient Education:  YES  Reviewed HEP     Progressed/Changed HEP based on:        Other Objective/Functional Measures:    Reduction in symptoms with SOR.   No reported pain (headache or pain into the elbows) with SOR.   Increase in symptoms with STM to cervical paraspinals and UT/Lev scap.      Post Treatment Pain Level (on 0 to 10) scale:   3  / 10     ASSESSMENT  Assessment/Changes in Function:     Significant improvement in pain levels with manual traction/SOR.  No other activity during this treatment led to reduction similar to SOR.         See Progress Note/Recertification   Patient will continue to benefit from skilled PT services to modify and progress therapeutic interventions, address functional mobility deficits, address ROM deficits, address strength deficits, analyze and address soft tissue restrictions, analyze and cue movement patterns and analyze and modify body mechanics/ergonomics to attain remaining goals.   Progress toward goals / Updated goals:    Slight reduction in initial reported pain level.  Progressing toward LTG #2.      PLAN    Upgrade activities as tolerated YES Continue plan of care     Discharge due to :      Other:      Therapist: Esmond Camper, PTA    Date: 08/26/2014 Time: 6:43 PM     Future Appointments  Date Time Provider Department Center   08/28/2014 5:30 PM Harlin Rain [facility]   09/02/2014 5:00 PM Esmond Camper, PTA [facility]   09/04/2014 5:00 PM Esmond Camper, PTA [facility]   09/11/2014 5:00 PM Esmond Camper, PTA [facility]   09/16/2014 5:00 PM Esmond Camper, PTA [facility]   09/18/2014 5:00 PM Esmond Camper, PTA [facility]

## 2014-08-28 ENCOUNTER — Inpatient Hospital Stay: Admit: 2014-08-28 | Payer: PRIVATE HEALTH INSURANCE | Primary: Family Medicine

## 2014-08-28 NOTE — Progress Notes (Signed)
PHYSICAL THERAPY - DAILY TREATMENT NOTE    Patient Name: Kelsey Bradley        Date: 08/28/2014  DOB: Nov 25, 1960   YES Patient DOB Verified  Visit #:   6   of   12  Insurance: Payor: [place] MED[place] / Plan: [place] MED[place]ARE PART A & B / Product Type: Medicare /      In time: 520 Out time: 615   Total Treatment Time: 55     Medicare Time Tracking (below)   Total Timed Codes (min):  45 1:1 Treatment Time:  30     TREATMENT AREA =  Bilateral low back pain [M54.5]    SUBJECTIVE  Pain Level (on 0 to 10 scale):  5-6  / 10   Medication Changes/New allergies or changes in medical history, any new surgeries or procedures?    NO    If yes, update Summary List   Subjective Functional Status/Changes:    No changes reported     Pt reports ~15% improvement in symptoms since beginning PT.  Pt states recent max pain has been >10/10 with no specific cause.  States the pain is not constant and comes in 'short bursts' that, at worst, last up to 20'.  Pt states the number of times this happens a day has decreased over the last few weeks.          OBJECTIVE  Modalities Rationale:     decrease pain and increase tissue extensibility to improve patient's ability to perform pain free cervical motion.    10 min  Estim, type/location: IFC, c/s paraspinals in supine.                                        att       unatt       w/US       w/ice      w/heat    min   Mechanical Traction: type/lbs                     pro     sup     int     cont      before manual      after manual    min   Ultrasound, settings/location:      min   Iontophoresis w/ dexamethasone, location:                                                 take home patch         in clinic    min   Ice       Heat    location/position:     min   Vasopneumatic Device, press/temp:     min   Other:     Skin assessment post-treatment (if applicable):      intact      redness- no adverse reaction     redness ??? adverse reaction:        30 (15)  min Therapeutic Exercise:    See flow sheet    Rationale:      increase ROM, increase strength, improve coordination and improve balance to improve the patient???s ability to ambulate.  15 Min Manual Therapy: STM/DTM (B) c/s paraspinals, SOR.  TPR (B) ut/scalenes.    Rationale:      decrease pain, increase ROM, increase tissue extensibility and decrease trigger points to improve patient's ability to perform pain free ADLs.      min Patient Education:  YES  Reviewed HEP     Progressed/Changed HEP based on:        Other Objective/Functional Measures:    FOTO = 45  GROC = +1      Post Treatment Pain Level (on 0 to 10) scale:   0   / 10     ASSESSMENT  Assessment/Changes in Function:     Pt reports 15% overall improvement since beginning PT.  Recent max pain reported as 10/10.  Pt reports pain is intermittent, coming in 20' bursts.  States frequency of these bursts has decreased.  Average pain is 7-10/10.  Current FOTO = 45, GROC = +1.  Pt has been unable to perform supine fnod w/ towel roll, citing pain with attempt.         See Progress Note/Recertification   Patient will continue to benefit from skilled PT services to modify and progress therapeutic interventions, address functional mobility deficits, address ROM deficits, address strength deficits, analyze and address soft tissue restrictions, analyze and cue movement patterns, analyze and modify body mechanics/ergonomics and assess and modify postural abnormalities to attain remaining goals.   Progress toward goals / Updated goals:    See note.      PLAN    Upgrade activities as tolerated YES Continue plan of care     Discharge due to :      Other:      Therapist: Esmond Camper, PTA    Date: 08/28/2014 Time: 5:45 PM     Future Appointments  Date Time Provider Department Center   09/02/2014 5:00 PM Esmond Camper, PTA [facility]   09/04/2014 5:00 PM Esmond Camper, PTA [facility]   09/11/2014 5:00 PM Esmond Camper, PTA [facility]   09/16/2014 5:00 PM Esmond Camper, PTA [facility]    09/18/2014 5:00 PM Esmond Camper, PTA [facility]

## 2014-08-28 NOTE — Progress Notes (Signed)
[facility] PHYSICAL THERAPY  [address], [address], [place] [address] - Phone: [phone]  Fax: [phone]  CONTINUED PLAN OF CARE/RECERTIFICATION FOR PHYSICAL THERAPY          Patient Name: Kelsey Bradley DOB: Dec 24, 1960   Treatment/Medical Diagnosis: Bilateral low back pain [M54.5]   Onset Date: chronic    Referral Source: Toni Amend, MD Start of Care [facility]): 07/18/14   Prior Hospitalization: See Medical History Provider #: [id number]   Prior Level of Function: Sedentary, NO UE limitation, lifting limited by back pain   Comorbidities: C/s fusion, latex allergy??   Medications: Verified on Patient Summary List   Visits from [facility]: 6 Missed Visits: 3     Goal/Measure of Progress Goal Met?   1.  Pt will increase FOTO by 2 points in order to show functional improvement   Status at last Eval: 39 Current Status: 45 yes   2.  Pt will note daily max pain </=6/10 in L UE in order to increase participation in adls   Status at last Eval: 10/10 Current Status:  10/10 no   3.  Pt will increase ls arom by 25% in all directions in order to A with twisting/turning   Status at last Eval: LSB 50%  All others 25%  Current Status: Na no     Key Functional Changes/Progress: Pt reports 15% overall improvement since beginning PT. Recent max pain reported as 10/10. Pt reports pain is intermittent, coming in 20' bursts. States frequency of these bursts has decreased. Average pain is 7-10/10. Current FOTO = 45, GROC = +1. Pt has been unable to perform supine fnod w/ towel roll, citing pain with attempt. Pt demonstrates excellent pain relief with SOR.   Problem List: pain affecting function, decrease ROM, decrease strength, impaired gait/ balance, decrease ADL/ functional abilitiies, decrease activity tolerance, decrease flexibility/ joint mobility and decrease transfer abilities   Treatment Plan may include any combination of the following: Therapeutic  exercise, Therapeutic activities, Neuromuscular re-education, Physical agent/modality, Gait/balance training, Manual therapy, Patient education, Self Care training, Functional mobility training, Home safety training and Stair training  Patient Goal(s) has been updated and includes:     ? Goals for this certification period include and are to be achieved in   4  weeks:  1. Pt will increase FOTO to >/=62 in order to show functional improvement  2. Pt will note daily max pain </=6/10 in L UE in order to increase participation in adls  3. Pt will increase ls arom by 25% in all directions in order to A with twisting/turning  Frequency / Duration:   Patient to be seen   2-3   times per week for   4    weeks:  G-Codes (GP): Carry [id number] Current  CK= 40-59%   [id number] Goal  CJ= 20-39%.  The severity rating is based on the FOTO Score    Assessments/Recommendations: Pt to continue 2-3x weekly for up to an additional 4 weeks to meet all LTG's.  Thank you for this referral.   If you have any questions/comments please contact us directly at [phone].   Thank you for allowing [place] to assist in the care of your patient.    Therapist Signature: Grant Fontana, PTA Date: 0/09/3816   Certification Period:  Reporting Period: 07/19/14 to 10/14/14  07/19/14 to 08/28/14 Time: 6:55 PM   NOTE TO PHYSICIAN:  PLEASE COMPLETE THE ORDERS BELOW AND FAX  TO   [facility]: [phone]  If you are unable to process this request in 24 hours please contact our office: [phone]    ___ I have read the above report and request that my patient continue as recommended.   ___ I have read the above report and request that my patient continue therapy with the following changes/special instructions: ________________________________________________   ___ I have read the above report and request that my patient be discharged from therapy.     Physician Signature:        Date:       Time:

## 2014-09-02 ENCOUNTER — Inpatient Hospital Stay: Admit: 2014-09-02 | Payer: PRIVATE HEALTH INSURANCE | Primary: Family Medicine

## 2014-09-02 NOTE — Progress Notes (Signed)
PHYSICAL THERAPY - DAILY TREATMENT NOTE    Patient Name: Kelsey Bradley        Date: 09/02/2014  DOB: [birth year]   YES Patient DOB Verified  Visit #:   7   of   12  Insurance: Payor: [place] MED[place]ARE / Plan: [place] MEDICARE PART A & B / Product Type: Medicare /      In time: 5 Out time: 6   Total Treatment Time: 60     Medicare Time Tracking (below)   Total Timed Codes (min):  50 1:1 Treatment Time:  25     TREATMENT AREA =  Bilateral low back pain [M54.5]    SUBJECTIVE  Pain Level (on 0 to 10 scale):  5  / 10   Medication Changes/New allergies or changes in medical history, any new surgeries or procedures?    NO    If yes, update Summary List   Subjective Functional Status/Changes:    No changes reported     Pt reports tightness in the (R) side of her neck.  States it hasn't improved much.        OBJECTIVE  Modalities Rationale:     decrease pain and increase tissue extensibility to improve patient's ability to perform pain free ADLs.   10 min  Estim, type/location: Pre-Mod, (B) cervical paraspinals.                                         att       unatt       w/US       w/ice      w/heat    min   Mechanical Traction: type/lbs                     pro     sup     int     cont      before manual      after manual    min   Ultrasound, settings/location:      min   Iontophoresis w/ dexamethasone, location:                                                 take home patch         in clinic   10 min   Ice       Heat    location/position: Prone, lumbar.     min   Vasopneumatic Device, press/temp:     min   Other:     Skin assessment post-treatment (if applicable):      intact      redness- no adverse reaction     redness ??? adverse reaction:        30 (15)  min Therapeutic Exercise:    See flow sheet   Rationale:      increase ROM, increase strength and improve coordination to improve the patient???s ability to perform pain free ADLs.      10 Min Manual Therapy: STM/DTM and TPR to (B) cervical paraspinals.  SOR.     Rationale:      decrease pain, increase ROM, increase tissue extensibility and decrease trigger points to improve patient's ability to perform pain free ADLs.  min Patient Education:  YES  Reviewed HEP     Progressed/Changed HEP based on:        Other Objective/Functional Measures:    Therex per flow sheet.      Post Treatment Pain Level (on 0 to 10) scale:   0   / 10     ASSESSMENT  Assessment/Changes in Function:     Good pain response to treatment.  Pt reports significant improvement with SOR.  Very tight along (R) scalenes with pain during TPR in the area.        See Progress Note/Recertification   Patient will continue to benefit from skilled PT services to modify and progress therapeutic interventions, address functional mobility deficits, address ROM deficits, address strength deficits, analyze and address soft tissue restrictions and analyze and cue movement patterns to attain remaining goals.   Progress toward goals / Updated goals:    Minimal change in progress toward LTG's with today's treatment.       PLAN    Upgrade activities as tolerated YES Continue plan of care     Discharge due to :      Other:      Therapist: Esmond CamperSeth B Dionte Blaustein, PTA    Date: 09/02/2014 Time: 6:14 PM     Future Appointments  Date Time Provider Department Center   09/04/2014 5:00 PM Esmond CamperSeth B Joby Hershkowitz, PTA [facility]   09/11/2014 5:00 PM Esmond CamperSeth B Jsaon Yoo, P[facility]   09/16/2014 5:00 PM Esmond CamperSeth B Berenis Corter, PTA [facility]   09/18/2014 5:00 PM Esmond CamperSeth B Lesbia Ottaway, PTA [facility]

## 2014-09-04 ENCOUNTER — Inpatient Hospital Stay: Admit: 2014-09-04 | Payer: PRIVATE HEALTH INSURANCE | Primary: Family Medicine

## 2014-09-04 NOTE — Progress Notes (Signed)
PHYSICAL THERAPY - DAILY TREATMENT NOTE    Patient Name: Kelsey Bradley        Date: 09/04/2014  DOB: June 18, 1960   YES Patient DOB Verified  Visit #:   8   of   12  Insurance: Payor: [place] MED[place] / Plan: [place] MED[place]ARE PART A & B / Product Type: Medicare /      In time: 455 Out time: 550   Total Treatment Time: 55     Medicare Time Tracking (below)   Total Timed Codes (min):  45 1:1 Treatment Time:  25     TREATMENT AREA =  Bilateral low back pain [M54.5]    SUBJECTIVE  Pain Level (on 0 to 10 scale):  0   / 10   Medication Changes/New allergies or changes in medical history, any new surgeries or procedures?    NO    If yes, update Summary List   Subjective Functional Status/Changes:    No changes reported     No pain reported.           OBJECTIVE  Modalities Rationale:     decrease pain and increase tissue extensibility to improve patient's ability to perform pain free ADLs.   12 min  Estim, type/location: Supine, Pre-mod, (B) c/s paraspinals.                                         att       unatt       w/US       w/ice      w/heat    min   Mechanical Traction: type/lbs                     pro     sup     int     cont      before manual      after manual    min   Ultrasound, settings/location:      min   Iontophoresis w/ dexamethasone, location:                                                 take home patch         in clinic   10 min   Ice       Heat    location/position: Supine, cervical.     min   Vasopneumatic Device, press/temp:     min   Other:     Skin assessment post-treatment (if applicable):      intact      redness- no adverse reaction     redness ??? adverse reaction:        20 (12)  min Therapeutic Exercise:    See flow sheet   Rationale:      increase ROM, increase strength and improve coordination to improve the patient???s ability to perform pain free ADLs.      13 Min Manual Therapy: STM (B) c/s paraspinals.  SOR.  TPR (R) UT/Lev scap.     Rationale:      decrease pain, increase ROM, increase tissue extensibility and decrease trigger points to improve patient's ability to perform pain free ADLs.      min Patient Education:  YES  Reviewed HEP     Progressed/Changed HEP based on:        Other Objective/Functional Measures:    Therex per flow sheet.      Post Treatment Pain Level (on 0 to 10) scale:   0   / 10     ASSESSMENT  Assessment/Changes in Function:     Significant reduction in pain levels today.  Pt (I) with therex.  No c/o pain during c/s AROM.  Monitor for continued decrease in pain.        See Progress Note/Recertification   Patient will continue to benefit from skilled PT services to modify and progress therapeutic interventions, address functional mobility deficits, address ROM deficits, address strength deficits, analyze and address soft tissue restrictions, analyze and cue movement patterns and analyze and modify body mechanics/ergonomics to attain remaining goals.   Progress toward goals / Updated goals:    No pain today.  Progressing toward LTG #2.      PLAN    Upgrade activities as tolerated YES Continue plan of care     Discharge due to :      Other:      Therapist: Esmond Camper, PTA    Date: 09/04/2014 Time: 5:20 PM     Future Appointments  Date Time Provider Department Center   09/11/2014 5:00 PM Esmond Camper, PTA [facility]   09/16/2014 5:00 PM Esmond Camper, PTA [facility]   09/18/2014 5:00 PM Esmond Camper, PTA [facility]

## 2014-09-11 ENCOUNTER — Encounter: Payer: PRIVATE HEALTH INSURANCE | Primary: Family Medicine

## 2014-09-16 ENCOUNTER — Inpatient Hospital Stay: Admit: 2014-09-16 | Payer: PRIVATE HEALTH INSURANCE | Primary: Family Medicine

## 2014-09-16 DIAGNOSIS — M545 Low back pain: Secondary | ICD-10-CM

## 2014-09-16 NOTE — Progress Notes (Signed)
PHYSICAL THERAPY - DAILY TREATMENT NOTE    Patient Name: Kelsey Bradley        Date: 09/16/2014  DOB: 1960-05-17   YES Patient DOB Verified  Visit #:   9   of   12  Insurance: Payor: [place] MED[place]ARE / Plan: [place] MEDICARE PART A & B / Product Type: Medicare /      In time: 455 Out time: 6   Total Treatment Time: 65     Medicare Time Tracking (below)   Total Timed Codes (min):  60 1:1 Treatment Time:  40     TREATMENT AREA =  Bilateral low back pain [M54.5]    SUBJECTIVE  Pain Level (on 0 to 10 scale):  6  / 10   Medication Changes/New allergies or changes in medical history, any new surgeries or procedures?    NO    If yes, update Summary List   Subjective Functional Status/Changes:    No changes reported     Pt reports the low back is doing good.  States the neck is doing good.  Reports elbow pain is the only thing still giving her trouble.           OBJECTIVE  Modalities Rationale:     decrease pain and increase tissue extensibility to improve patient's ability to perform pain free ADLs.   12 min  Estim, type/location: Pre-mod, (B) c/s paraspinals.                                        att       unatt       w/US       w/ice      w/heat    min   Mechanical Traction: type/lbs                     pro     sup     int     cont      before manual      after manual    min   Ultrasound, settings/location:      min   Iontophoresis w/ dexamethasone, location:                                                 take home patch         in clinic    min   Ice       Heat    location/position:     min   Vasopneumatic Device, press/temp:     min   Other:     Skin assessment post-treatment (if applicable):      intact      redness- no adverse reaction     redness ??? adverse reaction:        33 (25)  min Therapeutic Exercise:    See flow sheet   Rationale:      increase ROM, increase strength, improve coordination and improve balance to improve the patient???s ability to perform pain free ADLs.       15 Min Manual Therapy: STM/DTM (B) c/s paraspinals.  SOR.    Rationale:      decrease pain, increase ROM, increase tissue extensibility and decrease trigger points to  improve patient's ability to perform pain free ADLs.      min Patient Education:  YES  Reviewed HEP     Progressed/Changed HEP based on:        Other Objective/Functional Measures:    Therex per flow sheet.   Pain relief with SOR, including elbow.      Post Treatment Pain Level (on 0 to 10) scale:   0   / 10     ASSESSMENT  Assessment/Changes in Function:     Good tolerance to treatment.  Advised pt to return to MD for alternate treatment options as we've seen good progress since IE, but minimal change over last 2 weeks.         See Progress Note/Recertification   Patient will continue to benefit from skilled PT services to modify and progress therapeutic interventions, address functional mobility deficits, address ROM deficits, address strength deficits, analyze and address soft tissue restrictions, analyze and cue movement patterns and analyze and modify body mechanics/ergonomics to attain remaining goals.   Progress toward goals / Updated goals:    No change in progress toward LTG's after today's session.      PLAN    Upgrade activities as tolerated YES Continue plan of care     Discharge due to :      Other:      Therapist: Esmond Camper, PTA    Date: 09/16/2014 Time: 6:43 PM     Future Appointments  Date Time Provider Department Center   09/18/2014 5:00 PM Esmond Camper, PTA [facility]   09/23/2014 4:30 PM Esmond Camper, PTA [facility]   09/25/2014 4:00 PM Esmond Camper, PTA [facility]   09/30/2014 4:30 PM Esmond Camper, PTA [facility]   10/02/2014 4:30 PM Esmond Camper, PTA [facility]   10/07/2014 4:30 PM Esmond Camper, PTA [facility]   10/09/2014 4:30 PM Esmond Camper, PTA [facility]   10/14/2014 4:30 PM Esmond Camper, PTA [facility]   10/16/2014 4:30 PM Esmond Camper, PTA [facility]

## 2014-09-18 ENCOUNTER — Inpatient Hospital Stay: Admit: 2014-09-18 | Payer: PRIVATE HEALTH INSURANCE | Primary: Family Medicine

## 2014-09-18 NOTE — Progress Notes (Signed)
PHYSICAL THERAPY - DAILY TREATMENT NOTE    Patient Name: Kelsey Bradley        Date: 09/18/2014  DOB: 01-05-1960   YES Patient DOB Verified  Visit #:   10   of   12  Insurance: Payor: Medical sales representative / Plan: [employer] HMO / Product Type: HMO /      In time: 455 Out time: 510   Total Treatment Time: 15     Medicare Time Tracking (below)   Total Timed Codes (min):  15 1:1 Treatment Time:  15     TREATMENT AREA =  Bilateral low back pain [M54.5]    SUBJECTIVE  Pain Level (on 0 to 10 scale):  7  / 10   Medication Changes/New allergies or changes in medical history, any new surgeries or procedures?    NO    If yes, update Summary List   Subjective Functional Status/Changes:    No changes reported     Pt states she had a lot of pain along the (L) side of the neck and spine.  Reports pain is still focused in her (L) elbow.  Feels it in her shoulder, bicep, and (L) hand.  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  States this happened once yesterday and once again today.  Pt requested to not perform exercises, manual, or modalities today.        OBJECTIVE   min Patient Education:  YES  Reviewed HEP     Progressed/Changed HEP based on:        Other Objective/Functional Measures:    Billed 15' Self - Care    Lumbar AROM WNL.     GROC = -5  FOTO = 42       Post Treatment Pain Level (on 0 to 10) scale:   7   / 10     ASSESSMENT  Assessment/Changes in Function:     Pt has recently experienced a return of symptoms into the (L) UE in addition to numbness in (B) index fingers.  Current FOTO = 42, GROC = -5, indicating subjective decline in functional ability.  Pt did experience good pain response with manual, however has reported on multiple occasions a return of pain.  Pain primarily located in (L) elbow, with a recent increase in pain in the (L) shoulder, bicep, and hand.  No reports of pain in the low back.  Pt demonstrating Lumbar AROM wnl in all planes.          See Progress Note/Recertification   Patient will continue to benefit from skilled PT services to    Progress toward goals / Updated goals:    D/C      PLAN    Upgrade activities as tolerated No Continue plan of care     Discharge due to : Program complete.      Other:      Therapist: Esmond Camper, PTA    Date: 09/18/2014 Time: 4:54 PM     Future Appointments  Date Time Provider Department Center   09/18/2014 5:00 PM Esmond Camper, PTA [facility]   09/19/2014 3:30 PM Preston Fleeting, MD [facility]   09/23/2014 4:30 PM Esmond Camper, PTA [facility]   09/25/2014 4:00 PM Esmond Camper, PTA [facility]   09/30/2014 4:30 PM Esmond Camper, PTA [facility]   10/02/2014 4:30 PM Esmond Camper, PTA [facility]  10/07/2014 4:30 PM Esmond Camper, PTA [facility]   10/09/2014 4:30 PM Esmond Camper, PTA [facility]   10/14/2014 4:30 PM Esmond Camper, PTA [facility]   10/16/2014 4:30 PM Esmond Camper, PTA [facility]

## 2014-09-18 NOTE — Progress Notes (Signed)
[place] [facility] PHYSICAL THERAPY  [address], [place] [address] - Phone: [phone]  Fax: [phone]  DISCHARGE [facility] FOR PHYSICAL THERAPY          Patient Name: Kelsey Bradley DOB: February 21, 1960   Treatment/Medical Diagnosis: Bilateral low back pain [M54.5]   Onset Date: chronic    Referral Source: Toni Amend, MD [facility]): 07/18/14   Prior Hospitalization: See Medical History Provider #: [phone]   Prior Level of Function: Sedentary, NO UE limitation, lifting limited by back pain   Comorbidities: C/s fusion, latex allergy   Medications: Verified on Patient Summary List   [facility] from [facility]: 10 Missed Visits: 4     Goal/Measure of Progress Goal Met?   1.  Pt will increase FOTO to >/=62 in order to show functional improvement   Status at last Eval: 45 Current Status: 42 no   2.  Pt will note daily max pain </=6/10 in L UE in order to increase participation in adls   Status at last Eval: 10/10 max  Current Status: 8/10 max  progressing   3.  Pt will increase ls arom by 25% in all directions in order to A with twisting/turning   Status at last Eval: LSB 50%   All others 25%  Current Status: WNL all planes yes     Key Functional Changes/Progress: Pt has recently experienced a return of symptoms into the (L) UE in addition to numbness in (B) index fingers.  Current FOTO = 42, GROC = -5, indicating subjective decline in functional ability.  Pt did experience good pain response with manual, however has reported on multiple occasions a return of pain.  Pain primarily located in (L) elbow, with a recent increase in pain in the (L) shoulder, bicep, and hand.  No reports of pain in the low back.  Pt demonstrating Lumbar AROM wnl in all planes.    G-Codes (GP): Carry  [id number] Goal  CJ= 20-39%  [id number] D/C  CK= 40-59%.  The severity rating is based on the FOTO Score    Assessments/Recommendations: Discontinue therapy due to lack of  appreciable progress towards goals.  Recommend return to MD for further evaluation at this time.  Thank you for this referral.      If you have any questions/comments please contact us directly at [phone].   Thank you for allowing [place] to assist in the care of your patient.    Therapist Signature: Grant Fontana, PTA Date: 9/67/89   Certification Period:   Reporting Period: 07/19/14 to 10/14/14  08/28/14 to 09/18/14 Time: 5:36 PM

## 2014-09-19 ENCOUNTER — Ambulatory Visit
Admit: 2014-09-19 | Discharge: 2014-09-19 | Payer: PRIVATE HEALTH INSURANCE | Attending: Family Medicine | Primary: Family Medicine

## 2014-09-19 DIAGNOSIS — N951 Menopausal and female climacteric states: Secondary | ICD-10-CM

## 2014-09-19 MED ORDER — ESTRADIOL 10 MCG VAGINAL TAB
10 mcg | ORAL_TABLET | VAGINAL | 1 refills | Status: DC
Start: 2014-09-19 — End: 2014-12-12

## 2014-09-19 MED ORDER — DIAZEPAM 2 MG TAB
2 mg | ORAL_TABLET | Freq: Four times a day (QID) | ORAL | 1 refills | Status: DC | PRN
Start: 2014-09-19 — End: 2014-12-12

## 2014-09-19 MED ORDER — ESTRADIOL 2 MG TAB
2 mg | ORAL_TABLET | Freq: Every day | ORAL | 0 refills | Status: AC
Start: 2014-09-19 — End: ?

## 2014-09-19 MED ORDER — MEPERIDINE 100 MG TAB
100 mg | ORAL_TABLET | Freq: Four times a day (QID) | ORAL | 0 refills | Status: AC | PRN
Start: 2014-09-19 — End: ?

## 2014-09-19 NOTE — Patient Instructions (Signed)
Hormone Therapy (HT): Care Instructions  Your Care Instructions  Hormone therapy (HT) is medicine to treat symptoms of menopause, such as hot flashes, vaginal dryness, and sleep problems. It replaces the hormones that drop at menopause. Most women get relief from these symptoms within weeks of starting HT.  HT contains two female hormones, estrogen and progestin. HT may come in the form of a pill, patch, gel, spray, or vaginal ring. A vaginal cream or a vaginal ring that has a much lower dose of estrogen may be used to relieve vaginal dryness only.  HT has some risks. Most doctors recommend that women only take HT for as short a time as possible. This is to reduce the chances of heart disease, breast cancer, blood clots, and stroke that may be connected to HT. Be sure to have regular checkups with your doctor when taking HT.  Talk with your doctor about whether HT is right for you. If you decide that the benefits of HT outweigh the risks, ask your doctor to prescribe the lowest effective dose for as short a time as possible.  Follow-up care is a key part of your treatment and safety. Be sure to make and go to all appointments, and call your doctor if you are having problems. It's also a good idea to know your test results and keep a list of the medicines you take.  Why might you take HT?  ?? HT reduces symptoms of menopause. These include hot flashes, mood swings, and sleep problems.  ?? The estrogen in HT helps to prevent thinning bones. And it may lower the chance of colon cancer.  ?? HT helps keep the lining of the vagina moist and thick. This can reduce irritation.  ?? HT helps protect against dental problems, such as tooth loss and gum disease.  What are the risks of taking HT?  ?? Some women who take HT may have vaginal bleeding, bloating, nausea, sore breasts, mood swings, and headaches. Talk to your doctor about changing the type of HT you take or lowering the dose. This may help to end these side effects.   ?? Taking HT may slightly increase your risk for heart disease, breast cancer, ovarian cancer, blood clots, and stroke.  ?? You should not take HT if you:  ?? Could be pregnant.  ?? Have a personal history of breast cancer, endometrial cancer, pulmonary embolism, deep vein thrombosis, heart attack, or stroke.  ?? Have vaginal bleeding from an unknown cause.  ?? Have active liver disease.  What can you do to reduce the symptoms of menopause?  ?? Eat healthy foods and get regular exercise. This also will help to maintain strong bones and a healthy heart.  ?? Do not smoke. If you smoke, you can reduce hot flashes and long-term health risks by stopping. If you need help quitting, talk to your doctor about stop-smoking programs and medicines. These can increase your chances of quitting for good.  ?? Practice daily breathing exercises (meditation) to reduce hot flashes and mood swings.  ?? Limit the amount of alcohol you drink. This can reduce symptoms of menopause and long-term health risks.  ?? Keep your home and office cool.  ?? Use a vaginal lubricant, such as Astroglide, Wet Gel Lubricant, or K-Y Jelly.  ?? Do pelvic floor (Kegel) exercises, which tighten and strengthen pelvic muscles. To do Kegel exercises:  ?? Squeeze the same muscles you would use to stop your urine. Your belly and thighs should not move.  ??   Hold the squeeze for 3 seconds, then relax for 3 seconds.  ?? Start with 3 seconds. Then add 1 second each week until you are able to squeeze for 10 seconds.  ?? Repeat the exercise 10 to 15 times a session. Do three or more sessions a day.  Where can you learn more?  Go to http://www.healthwise.net/GoodHelpConnections  Enter V552 in the search box to learn more about "Hormone Therapy (HT): Care Instructions."  ?? 2006-2016 Healthwise[employer]. Care instructions adapted under license by [employer] (which disclaims liability or warranty for this information). This care instruction is for use with your licensed  healthcare professional. If you have questions about a medical condition or this instruction, always ask your healthcare professional. Healthwise, Incorporated disclaims any warranty or liability for your use of this information.  Content Version: 10.9.538570; Current as of: February 28, 2014

## 2014-09-19 NOTE — Progress Notes (Signed)
1. Have you been to the ER, urgent care clinic since your last visit?  Hospitalized since your last visit?no    2. Have you seen or consulted any other health care providers outside of the [facility] since your last visit?  Include any pap smears or colon screening. No    Patient Kelsey Bradley is a 54 y.o. female presents today for elbow pain.

## 2014-09-20 NOTE — Progress Notes (Signed)
Kelsey Bradley is a 54 y.o. Caucasian female and presents with chronic left elbow pain not getting any better.   She wanted to see Endo for post menopausal symptoms.     Chief Complaint   Patient presents with   ??? Elbow Pain     Subjective:    Additional Concerns: none    Patient Active Problem List    Diagnosis Date Noted   ??? Left forearm pain 06/08/2014   ??? Left elbow pain 06/08/2014   ??? Cervical radiculopathy 05/16/2014   ??? Acute bilateral low back pain with bilateral sciatica 05/16/2014   ??? Breast pain, left 05/16/2014   ??? Abnormal weight gain 02/22/2014   ??? Hair loss 02/22/2014   ??? Fatigue 02/22/2014   ??? Cervical pain (neck) 02/22/2014   ??? Muscle spasm 02/22/2014   ??? 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 Mild intermittent asthma 11/12/2013     Current Outpatient Prescriptions   Medication Sig Dispense Refill   ??? estradiol (VAGIFEM) 10 mcg tab vaginal tablet Insert 1 Tab into vagina every Monday, Wednesday, Friday. Three times weekly. 36 Tab 1   ??? estradiol (ESTRACE) 2 mg tablet Take 1 Tab by mouth daily. 180 Tab 0   ??? meperidine (DEMEROL) 100 mg tablet Take 1 Tab by mouth every six (6) hours as needed for Pain. Max Daily Amount: 400 mg. 60 Tab 0   ??? diazepam (VALIUM) 2 mg tablet Take 1 Tab by mouth every six (6) hours as needed for Anxiety. Max Daily Amount: 8 mg. 30 Tab 1   ??? oxyCODONE-acetaminophen (PERCOCET) 7.5-325 mg per tablet Take 1 Tab by mouth every six (6) hours as needed for Pain. Max Daily Amount: 4 Tabs. 60 Tab 0   ??? diazepam (VALIUM) 10 mg tablet Take 1 Tab by mouth every six (6) hours as needed for Anxiety. Max Daily Amount: 40 mg. Indications: ANXIETY, MUSCLE SPASM 30 Tab 0   ??? Cholecalciferol, Vitamin D3, (VITAMIN D3) 2,000 unit cap capsule Take 2,000 Units by mouth daily. 90 Cap 2   ??? budesonide-formoterol (SYMBICORT) 160-4.5 mcg/actuation HFA inhaler Take 2 Puffs by inhalation two (2) times a day.     ??? multivitamin (ONE A DAY) tablet Take 1 Tab by mouth daily.      ??? BIOTIN PO Take 5,000 mg by mouth daily.     ??? GLUCOSAMINE-CONDROITIN-HRB#182 PO Take 1 Tab by mouth daily.     ??? SUMAtriptan (IMITREX) 50 mg tablet Take one tab at the start of your migraine. May repeat every 2 hours, not to exceed 4 tabs in 24 hours 10 Tab 0     Allergies   Allergen Reactions   ??? Latex Rash   ??? Shellfish Derived Anaphylaxis   ??? Anaprox [Naproxen Sodium] Other (comments)     GI Bleeding   ??? Celebrex [Celecoxib] Other (comments)     GI Bleeding   ??? Codeine Itching and Nausea Only   ??? Doxycycline Unknown (comments)   ??? Motrin [Ibuprofen] Other (comments)     GI Bleeding   ??? Naprosyn [Naproxen] Other (comments)     GI Bleeding   ??? Neurontin [Gabapentin] Unknown (comments)   ??? Norco [Hydrocodone-Acetaminophen] Other (comments)     Hair Sheds   ??? Nsaids (Non-Steroidal Anti-Inflammatory Drug) Other (comments)     Gastric bleed   ??? Singulair [Montelukast] Other (comments)     Caused Fainting   ??? Strawberry Hives and Nausea and Vomiting   ??? Sulfamethoxazole-Trimethoprim Other (comments)  Increases pain   ??? Tramadol Other (comments)     confusion     Past Medical History   Diagnosis Date   ??? Chronic neck pain    ??? Spondylosis    ??? Torticollis      Past Surgical History   Procedure Laterality Date   ??? Hx total abdominal hysterectomy     ??? Hx knee arthroscopy       x4   ??? Hx rotator cuff repair       x2   ??? Hx cervical fusion       Family History   Problem Relation Age of Onset   ??? Diabetes Mother    ??? Hypertension Mother    ??? Cancer Father      prostate   ??? Hypertension Father      Social History   Substance Use Topics   ??? Smoking status: Former Smoker     Quit date: 02/21/1983   ??? Smokeless tobacco: Never Used   ??? Alcohol use No     ROS     General: negative for - chills, fatigue, fever, weight change  Psych: positive for - anxiety, no depression, irritability or mood swings  ENT: negative for - headaches, hearing change, nasal congestion, oral lesions, sneezing or sore throat   Resp: negative for - cough, shortness of breath or wheezing  CV: negative for - chest pain, edema or palpitations  GI: negative for - abdominal pain, change in bowel habits, constipation, diarrhea or nausea/vomiting  GU: negative for - dysuria, hematuria, incontinence, pelvic pain or vulvar/vaginal symptoms  MSK: positive for -left elbow joint pain, no joint swelling or muscle pain    Objective:  Vitals:    09/19/14 1542   BP: 137/81   Pulse: (!) 59   Resp: 18   Temp: 97.9 ??F (36.6 ??C)   TempSrc: Oral   SpO2: 98%   Weight: 158 lb (71.7 kg)   Height: 5' 2.52" (1.588 m)   PainSc:   7   PainLoc: Elbow     PE    Alert, well appearing, and in no distress and oriented to person, place, and time.   Psych - positive for anxiety  Mental status - alert, oriented to person, place, and time, normal mood, behavior, speech, dress, motor activity, and thought processes  Chest - clear to auscultation, no wheezes, rales or rhonchi, symmetric air entry.   Heart - normal rate, regular rhythm, normal S1, S2, no murmurs, rubs, clicks or gallops.   Extremities - peripheral pulses normal, no pedal edema, no clubbing or cyanosis, left elbow pain on palpation both lateral and medial.     LABS   Abstract on 07/10/2014   Component Date Value Ref Range Status   ??? Creatinine, External 11/13/2013 1.03   Final     TESTS  Results for orders placed or performed in visit on 07/10/14   AMB EXT CREATININE   Result Value Ref Range    Creatinine, External 1.03      Assessment/Plan:      1. Menopausal symptoms - Refilled her HRT meds today as well.   - REFERRAL TO ENDOCRINOLOGY    2. Chronic left elbow pain - Trial of Demerol since other common opioids not working anymore. Patient needed to see her specialists for this issue. She is made to understand I do not do chronic pain meds with  Opioid meds.     3. Anxiety - Valium refilled with precaution with above  narcotic. Patient expresses understanding.      Lab review: no lab studies available for review at time of visit.     I have discussed the diagnosis with the patient and the intended plan as seen in the above orders.  The patient has received an after-visit summary and questions were answered concerning future plans.  I have discussed medication side effects and warnings with the patient as well.I have reviewed the plan of care with the patient, accepted their input and they are in agreement with the treatment goals.     F/U as needed.     Preston Fleeting, MD

## 2014-09-23 ENCOUNTER — Encounter: Payer: PRIVATE HEALTH INSURANCE | Primary: Family Medicine

## 2014-09-25 ENCOUNTER — Encounter: Payer: PRIVATE HEALTH INSURANCE | Primary: Family Medicine

## 2014-09-25 NOTE — Telephone Encounter (Signed)
Pt was seen on 09/19/14 and was given a prescription for Demerol. Pt states that every time she takes a pill, that she get a severe headache. Pt would like to know if she can take anything else for pain. Please advise.

## 2014-09-26 NOTE — Telephone Encounter (Signed)
She asked for Demerol. It seems she has tried the other common opioids meds. She probably needs to see pain management.

## 2014-09-27 NOTE — Telephone Encounter (Signed)
Pt is calling to follow up on med refill for (PERCOCET) 7.5-325 mg per tablet . Pt was not able to take Demerol because gives her severe headaches- pt only took 2-3 pills. Pt was advised that she needs to schedule an appt for Percocet, but stated that she can't leave work early for an appt. Pt is asking if she can get a prescription for Percocet to hold her over until her specialist appt on Tuesday without being seen. Please advise.

## 2014-09-28 NOTE — Telephone Encounter (Signed)
Pt needs to schedule appointment for medication. We can't refill controlled medications without being seen.

## 2014-09-30 ENCOUNTER — Encounter: Payer: PRIVATE HEALTH INSURANCE | Primary: Family Medicine

## 2014-10-01 NOTE — Telephone Encounter (Signed)
Appt scheduled for Friday, October 04, 2014 03:45 PM

## 2014-10-02 ENCOUNTER — Encounter: Payer: PRIVATE HEALTH INSURANCE | Primary: Family Medicine

## 2014-10-04 ENCOUNTER — Ambulatory Visit
Admit: 2014-10-04 | Discharge: 2014-10-04 | Payer: PRIVATE HEALTH INSURANCE | Attending: Family Medicine | Primary: Family Medicine

## 2014-10-04 DIAGNOSIS — M542 Cervicalgia: Secondary | ICD-10-CM

## 2014-10-04 MED ORDER — DIAZEPAM 10 MG TAB
10 mg | ORAL_TABLET | Freq: Four times a day (QID) | ORAL | 0 refills | Status: DC | PRN
Start: 2014-10-04 — End: 2014-12-12

## 2014-10-04 MED ORDER — OXYCODONE-ACETAMINOPHEN 10 MG-325 MG TAB
10-325 mg | ORAL_TABLET | Freq: Four times a day (QID) | ORAL | 0 refills | Status: DC | PRN
Start: 2014-10-04 — End: 2014-12-12

## 2014-10-04 MED ORDER — DIAZEPAM 2 MG TAB
2 mg | ORAL_TABLET | Freq: Four times a day (QID) | ORAL | 0 refills | Status: AC | PRN
Start: 2014-10-04 — End: ?

## 2014-10-04 NOTE — Progress Notes (Signed)
Kelsey Bradley is a 54 y.o. African American female and presents with need to refill her opioid pain meds. She is trying to get   Resolution for her chronic pain but apparently she does not like her interaction with her current Ortho specialist. She was also referred in the   Past already for pain management and NS.     Chief Complaint   Patient presents with   ??? Arm Pain   ??? Medication Refill     Subjective:    Additional Concerns: none    Patient Active Problem List    Diagnosis Date Noted   ??? Left forearm pain 06/08/2014   ??? Left elbow pain 06/08/2014   ??? Cervical radiculopathy 05/16/2014   ??? Acute bilateral low back pain with bilateral sciatica 05/16/2014   ??? Breast pain, left 05/16/2014   ??? Abnormal weight gain 02/22/2014   ??? Hair loss 02/22/2014   ??? Fatigue 02/22/2014   ??? Cervical pain (neck) 02/22/2014   ??? Muscle spasm 02/22/2014   ??? 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 Mild intermittent asthma 11/12/2013     Current Outpatient Prescriptions   Medication Sig Dispense Refill   ??? oxyCODONE-acetaminophen (PERCOCET 10) 10-325 mg per tablet Take 1 Tab by mouth every six (6) hours as needed for Pain. Max Daily Amount: 4 Tabs. 60 Tab 0   ??? estradiol (ESTRACE) 2 mg tablet Take 1 Tab by mouth daily. 180 Tab 0   ??? diazepam (VALIUM) 10 mg tablet Take 1 Tab by mouth every six (6) hours as needed for Anxiety. Max Daily Amount: 40 mg. Indications: ANXIETY, MUSCLE SPASM 30 Tab 0   ??? Cholecalciferol, Vitamin D3, (VITAMIN D3) 2,000 unit cap capsule Take 2,000 Units by mouth daily. 90 Cap 2   ??? budesonide-formoterol (SYMBICORT) 160-4.5 mcg/actuation HFA inhaler Take 2 Puffs by inhalation two (2) times a day.     ??? multivitamin (ONE A DAY) tablet Take 1 Tab by mouth daily.     ??? BIOTIN PO Take 5,000 mg by mouth daily.     ??? GLUCOSAMINE-CONDROITIN-HRB#182 PO Take 1 Tab by mouth daily.     ??? estradiol (VAGIFEM) 10 mcg tab vaginal tablet Insert 1 Tab into vagina every Monday, Wednesday, Friday. Three times weekly. 36 Tab 1    ??? meperidine (DEMEROL) 100 mg tablet Take 1 Tab by mouth every six (6) hours as needed for Pain. Max Daily Amount: 400 mg. 60 Tab 0   ??? diazepam (VALIUM) 2 mg tablet Take 1 Tab by mouth every six (6) hours as needed for Anxiety. Max Daily Amount: 8 mg. 30 Tab 1   ??? oxyCODONE-acetaminophen (PERCOCET) 7.5-325 mg per tablet Take 1 Tab by mouth every six (6) hours as needed for Pain. Max Daily Amount: 4 Tabs. 60 Tab 0   ??? SUMAtriptan (IMITREX) 50 mg tablet Take one tab at the start of your migraine. May repeat every 2 hours, not to exceed 4 tabs in 24 hours 10 Tab 0     Allergies   Allergen Reactions   ??? Latex Rash   ??? Shellfish Derived Anaphylaxis   ??? Anaprox [Naproxen Sodium] Other (comments)     GI Bleeding   ??? Celebrex [Celecoxib] Other (comments)     GI Bleeding   ??? Codeine Itching and Nausea Only   ??? Doxycycline Unknown (comments)   ??? Motrin [Ibuprofen] Other (comments)     GI Bleeding   ??? Naprosyn [Naproxen] Other (comments)     GI Bleeding   ???  Neurontin [Gabapentin] Unknown (comments)   ??? Norco [Hydrocodone-Acetaminophen] Other (comments)     Hair Sheds   ??? Nsaids (Non-Steroidal Anti-Inflammatory Drug) Other (comments)     Gastric bleed   ??? Singulair [Montelukast] Other (comments)     Caused Fainting   ??? Strawberry Hives and Nausea and Vomiting   ??? Sulfamethoxazole-Trimethoprim Other (comments)     Increases pain   ??? Tramadol Other (comments)     confusion     Past Medical History   Diagnosis Date   ??? Chronic neck pain    ??? Spondylosis    ??? Torticollis      Past Surgical History   Procedure Laterality Date   ??? Hx total abdominal hysterectomy     ??? Hx knee arthroscopy       x4   ??? Hx rotator cuff repair       x2   ??? Hx cervical fusion       Family History   Problem Relation Age of Onset   ??? Diabetes Mother    ??? Hypertension Mother    ??? Cancer Father      prostate   ??? Hypertension Father      Social History   Substance Use Topics   ??? Smoking status: Former Smoker     Quit date: 02/21/1983    ??? Smokeless tobacco: Never Used   ??? Alcohol use No     ROS     General: negative for - chills, fatigue, fever, weight change  Psych: negative for - anxiety, depression, irritability or mood swings  ENT: negative for - headaches, hearing change, nasal congestion, oral lesions, sneezing or sore throat  Heme/ Lymph: negative for - bleeding problems, bruising, pallor or swollen lymph nodes  Endo: negative for - hot flashes, polydipsia/polyuria or temperature intolerance  Resp: negative for - cough, shortness of breath or wheezing  CV: negative for - chest pain, edema or palpitations  GI: negative for - abdominal pain, change in bowel habits, constipation, diarrhea or nausea/vomiting  GU: negative for - dysuria, hematuria, incontinence, pelvic pain or vulvar/vaginal symptoms  MSK: negative for - joint pain, joint swelling, positive for neck/ muscle pain  Neuro: negative for - confusion, headaches, seizures or weakness    Objective:  Vitals:    10/04/14 1557   BP: 127/76   Pulse: 64   Resp: 16   Temp: 97 ??F (36.1 ??C)   TempSrc: Oral   SpO2: 99%   Weight: 158 lb 9.6 oz (71.9 kg)   Height: 5' 2.5" (1.588 m)   PainSc:   5   PainLoc: Elbow     PE    Alert, well appearing, and in no distress, oriented to person, place, and time and normal appearing weight  Mental status - alert, oriented to person, place, and time, normal mood, behavior, speech, dress, motor activity, and thought processes  Neck pain on flexion, extension and side to sid emotion also on palpation, no deformities or edema  Chest - clear to auscultation, no wheezes, rales or rhonchi, symmetric air entry  Heart - normal rate, regular rhythm, normal S1, S2, no murmurs, rubs, clicks or gallops  Extremities - peripheral pulses normal, no pedal edema, no clubbing or cyanosis    LABS   Abstract on 07/10/2014   Component Date Value Ref Range Status   ??? Creatinine, External 11/13/2013 1.03   Final       TESTS   Results for orders placed or performed in visit  on 07/10/14   AMB EXT CREATININE   Result Value Ref Range    Creatinine, External 1.03      Assessment/Plan:      1. Chronic cervical pain (neck)  - REFERRAL TO ORTHOPEDICS  - oxyCODONE-acetaminophen (PERCOCET 10) 10-325 mg per tablet; Take 1 Tab by mouth every six (6) hours as needed for Pain. Max Daily Amount: 4 Tabs.  Dispense: 60 Tab; Refill: 0    2. Chronic cervical radiculopathy  - REFERRAL TO ORTHOPEDICS  - oxyCODONE-acetaminophen (PERCOCET 10) 10-325 mg per tablet; Take 1 Tab by mouth every six (6) hours as needed for Pain. Max Daily Amount: 4 Tabs.  Dispense: 60 Tab; Refill: 0    3.Chronic bilateral low back pain with bilateral sciatica  - REFERRAL TO ORTHOPEDICS  - oxyCODONE-acetaminophen (PERCOCET 10) 10-325 mg per tablet; Take 1 Tab by mouth every six (6) hours as needed for Pain. Max Daily Amount: 4 Tabs.  Dispense: 60 Tab; Refill: 0  Compounding meds refilled.     Lab review: no lab studies available for review at time of visit.     I have discussed the diagnosis with the patient and the intended plan as seen in the above orders.  The patient has received an after-visit summary and questions were answered concerning future plans.  I have discussed medication side effects and warnings with the patient as well.I have reviewed the plan of care with the patient, accepted their input and they are in agreement with the treatment goals.     Follow-up Disposition:  Return if symptoms worsen or fail to improve.     Sheela Stack, MD

## 2014-10-04 NOTE — Progress Notes (Signed)
Kelsey Bradley is a 54 y.o. female here for medication refill and f/u arm pain. Pt states that she went to ortho to see Dr. Graciela Husbands.Pt would like to discuss pain medication. Pt was given Demerol at her last appointment and that she had extreme headaches so she would like something else.

## 2014-10-04 NOTE — Patient Instructions (Addendum)
Chronic Pain: Care Instructions  Your Care Instructions  Chronic pain is pain that lasts a long time (months or even years) and may or may not have a clear cause. It is different from acute pain, which usually does have a clear cause???like an injury or illness???and gets better over time. Chronic pain:  ?? Lasts over time but may vary from day to day.  ?? Does not go away despite efforts to end it.  ?? May disrupt your sleep and lead to fatigue.  ?? May cause depression or anxiety.  ?? May make your muscles tense, causing more pain.  ?? Can disrupt your work, hobbies, home life, and relationships with friends and family.  Chronic pain is a very real condition. It is not just in your head. Treatment can help and usually includes several methods used together, such as medicines, physical therapy, exercise, and other treatments. Learning how to relax and changing negative thought patterns can also help you cope.  Chronic pain is complex. Taking an active role in your treatment will help you better manage your pain. Tell your doctor if you have trouble dealing with your pain. You may have to try several things before you find what works best for you.  Follow-up care is a key part of your treatment and safety. Be sure to make and go to all appointments, and call your doctor if you are having problems. It???s also a good idea to know your test results and keep a list of the medicines you take.  How can you care for yourself at home?  ?? Pace yourself. Break up large jobs into smaller tasks. Save harder tasks for days when you have less pain, or go back and forth between hard tasks and easier ones. Take rest breaks.  ?? Relax, and reduce stress. Relaxation techniques such as deep breathing or meditation can help.  ?? Keep moving. Gentle, daily exercise can help reduce pain over the long run. Try low- or no-impact exercises such as walking, swimming, and stationary biking. Do stretches to stay flexible.   ?? Try heat, cold packs, and massage.  ?? Get enough sleep. Chronic pain can make you tired and drain your energy. Talk with your doctor if you have trouble sleeping because of pain.  ?? Think positive. Your thoughts can affect your pain level. Do things that you enjoy to distract yourself when you have pain instead of focusing on the pain. See a movie, read a book, listen to music, or spend time with a friend.  ?? If you think you are depressed, talk to your doctor about treatment.  ?? Keep a daily pain diary. Record how your moods, thoughts, sleep patterns, activities, and medicine affect your pain. You may find that your pain is worse during or after certain activities or when you are feeling a certain emotion. Having a record of your pain can help you and your doctor find the best ways to treat your pain.  ?? Take pain medicines exactly as directed.  ?? If the doctor gave you a prescription medicine for pain, take it as prescribed.  ?? If you are not taking a prescription pain medicine, ask your doctor if you can take an over-the-counter medicine.  Reducing constipation caused by pain medicine  ?? Include fruits, vegetables, beans, and whole grains in your diet each day. These foods are high in fiber.  ?? Drink plenty of fluids, enough so that your urine is light yellow or clear like water. If you   have kidney, heart, or liver disease and have to limit fluids, talk with your doctor before you increase the amount of fluids you drink.  ?? If your doctor recommends it, get more exercise. Walking is a good choice. Bit by bit, increase the amount you walk every day. Try for at least 30 minutes on most days of the week.  ?? Schedule time each day for a bowel movement. A daily routine may help. Take your time and do not strain when having a bowel movement.  When should you call for help?  Call your doctor now or seek immediate medical care if:  ?? Your pain gets worse or is out of control.   ?? You feel down or blue, or you do not enjoy things like you once did. You may be depressed, which is common in people with chronic pain. Depression can be treated.  ?? You have vomiting or cramps for more than 2 hours.  Watch closely for changes in your health, and be sure to contact your doctor if:  ?? You cannot sleep because of pain.  ?? You are very worried or anxious about your pain.  ?? You have trouble taking your pain medicine.  ?? You have any concerns about your pain medicine.  ?? You have trouble with bowel movements, such as:  ?? No bowel movement in 3 days.  ?? Blood in the anal area, in your stool, or on the toilet paper.  ?? Diarrhea for more than 24 hours.  Where can you learn more?  Go to http://www.healthwise.net/[employer]  Enter N004 in the search box to learn more about "Chronic Pain: Care Instructions."  ?? 2006-2016 [employer]. Care instructions adapted under license by [employer] (which disclaims liability or warranty for this information). This care instruction is for use with your licensed healthcare professional. If you have questions about a medical condition or this instruction, always ask your healthcare professional. [employer] disclaims any warranty or liability for your use of this information.  Content Version: 11.0.578772; Current as of: February 22, 2014

## 2014-10-07 ENCOUNTER — Encounter: Payer: MEDICARE | Primary: Family Medicine

## 2014-10-09 ENCOUNTER — Encounter: Payer: MEDICARE | Primary: Family Medicine

## 2014-10-14 ENCOUNTER — Encounter: Payer: MEDICARE | Primary: Family Medicine

## 2014-10-16 ENCOUNTER — Encounter: Payer: MEDICARE | Primary: Family Medicine

## 2014-11-13 ENCOUNTER — Encounter: Attending: Family Medicine | Primary: Family Medicine

## 2014-11-19 ENCOUNTER — Encounter: Attending: Family Medicine | Primary: Family Medicine

## 2014-12-10 MED ORDER — SYMBICORT 160 MCG-4.5 MCG/ACTUATION HFA AEROSOL INHALER
RESPIRATORY_TRACT | 2 refills | Status: DC
Start: 2014-12-10 — End: 2014-12-12

## 2014-12-12 ENCOUNTER — Ambulatory Visit
Admit: 2014-12-12 | Discharge: 2014-12-12 | Payer: PRIVATE HEALTH INSURANCE | Attending: Family Medicine | Primary: Family Medicine

## 2014-12-12 DIAGNOSIS — H6123 Impacted cerumen, bilateral: Secondary | ICD-10-CM

## 2014-12-12 MED ORDER — DIAZEPAM 10 MG TAB
10 mg | ORAL_TABLET | Freq: Four times a day (QID) | ORAL | 0 refills | Status: DC | PRN
Start: 2014-12-12 — End: 2015-01-15

## 2014-12-12 MED ORDER — CHOLECALCIFEROL (VITAMIN D3) 2,000 UNIT CAPSULE
ORAL_CAPSULE | Freq: Every day | ORAL | 2 refills | Status: AC
Start: 2014-12-12 — End: ?

## 2014-12-12 MED ORDER — ESTRADIOL 10 MCG VAGINAL TAB
10 mcg | ORAL_TABLET | VAGINAL | 1 refills | Status: AC
Start: 2014-12-12 — End: ?

## 2014-12-12 MED ORDER — OXYCODONE-ACETAMINOPHEN 10 MG-325 MG TAB
10-325 mg | ORAL_TABLET | Freq: Four times a day (QID) | ORAL | 0 refills | Status: DC | PRN
Start: 2014-12-12 — End: 2015-01-15

## 2014-12-12 MED ORDER — BUDESONIDE-FORMOTEROL HFA 160 MCG-4.5 MCG/ACTUATION AEROSOL INHALER
RESPIRATORY_TRACT | 5 refills | Status: DC
Start: 2014-12-12 — End: 2015-07-28

## 2014-12-12 NOTE — Progress Notes (Signed)
Kelsey Bradley is a 54 y.o. female presents to office for Shoulder pain           Health Maintenance items with a due date reviewed with patient:  Health Maintenance Due   Topic Date Due   ??? Hepatitis C Screening  [birth year]   ??? Pneumococcal 19-64 Medium Risk (1 of 1 - PPSV23) 01/04/1977   ??? DTaP/Tdap/Td series (1 - Tdap) 01/05/1979   ??? PAP AKA CERVICAL CYTOLOGY  01/05/1979   ??? BREAST CANCER SCRN MAMMOGRAM  01/05/2008   ??? FOBT Q 1 YEAR AGE 88-75  01/05/2008   ??? INFLUENZA AGE 88 TO ADULT  08/05/2014       Kelsey Bradley is a 54 y.o. African American female and presents with     Chief Complaint   Patient presents with   ??? Shoulder Pain   ??? Memory Loss     Subjective:    Additional Concerns: none    Patient Active Problem List    Diagnosis Date Noted   ??? Left forearm pain 06/08/2014   ??? Left elbow pain 06/08/2014   ??? Cervical radiculopathy 05/16/2014   ??? Acute bilateral low back pain with bilateral sciatica 05/16/2014   ??? Breast pain, left 05/16/2014   ??? Abnormal weight gain 02/22/2014   ??? Hair loss 02/22/2014   ??? Fatigue 02/22/2014   ??? Cervical pain (neck) 02/22/2014   ??? Muscle spasm 02/22/2014   ??? 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 Mild intermittent asthma 11/12/2013     Current Outpatient Prescriptions   Medication Sig Dispense Refill   ??? estradiol (VAGIFEM) 10 mcg tab vaginal tablet Insert 1 Tab into vagina every Monday, Wednesday, Friday. Three times weekly. 36 Tab 1   ??? Cholecalciferol, Vitamin D3, (VITAMIN D3) 2,000 unit cap capsule Take 2,000 Units by mouth daily. 90 Cap 2   ??? budesonide-formoterol (SYMBICORT) 160-4.5 mcg/actuation HFA inhaler INHALE 2 PUFFS BY MOUTH TWICE DAILY. 1 Inhaler 5   ??? oxyCODONE-acetaminophen (PERCOCET 10) 10-325 mg per tablet Take 1 Tab by mouth every six (6) hours as needed for Pain. Max Daily Amount: 4 Tabs. 60 Tab 0   ??? diazePAM (VALIUM) 10 mg tablet Take 1 Tab by mouth every six (6) hours as needed for Anxiety. Max Daily Amount: 40 mg. Indications: ANXIETY,  MUSCLE SPASM 30 Tab 0   ??? estradiol (ESTRACE) 2 mg tablet Take 1 Tab by mouth daily. 180 Tab 0   ??? multivitamin (ONE A DAY) tablet Take 1 Tab by mouth daily.     ??? BIOTIN PO Take 5,000 mg by mouth daily.     ??? GLUCOSAMINE-CONDROITIN-HRB#182 PO Take 1 Tab by mouth daily.     ??? diazepam (VALIUM) 2 mg tablet Take 5 Tabs by mouth every six (6) hours as needed for Anxiety. Max Daily Amount: 40 mg. Indications: MUSCLE SPASM 30 Tab 0   ??? meperidine (DEMEROL) 100 mg tablet Take 1 Tab by mouth every six (6) hours as needed for Pain. Max Daily Amount: 400 mg. 60 Tab 0   ??? oxyCODONE-acetaminophen (PERCOCET) 7.5-325 mg per tablet Take 1 Tab by mouth every six (6) hours as needed for Pain. Max Daily Amount: 4 Tabs. 60 Tab 0   ??? SUMAtriptan (IMITREX) 50 mg tablet Take one tab at the start of your migraine. May repeat every 2 hours, not to exceed 4 tabs in 24 hours 10 Tab 0     Allergies   Allergen Reactions   ??? Latex Rash   ???  Shellfish Derived Anaphylaxis   ??? Anaprox [Naproxen Sodium] Other (comments)     GI Bleeding   ??? Celebrex [Celecoxib] Other (comments)     GI Bleeding   ??? Codeine Itching and Nausea Only   ??? Doxycycline Unknown (comments)   ??? Motrin [Ibuprofen] Other (comments)     GI Bleeding   ??? Naprosyn [Naproxen] Other (comments)     GI Bleeding   ??? Neurontin [Gabapentin] Unknown (comments)   ??? Norco [Hydrocodone-Acetaminophen] Other (comments)     Hair Sheds   ??? Nsaids (Non-Steroidal Anti-Inflammatory Drug) Other (comments)     Gastric bleed   ??? Singulair [Montelukast] Other (comments)     Caused Fainting   ??? Strawberry Hives and Nausea and Vomiting   ??? Sulfamethoxazole-Trimethoprim Other (comments)     Increases pain   ??? Tramadol Other (comments)     confusion     Past Medical History   Diagnosis Date   ??? Chronic neck pain    ??? Spondylosis    ??? Torticollis      Past Surgical History   Procedure Laterality Date   ??? Hx total abdominal hysterectomy     ??? Hx knee arthroscopy       x4   ??? Hx mohs procedure       x2    ??? Hx cervical fusion       Family History   Problem Relation Age of Onset   ??? Diabetes Mother    ??? Hypertension Mother    ??? Cancer Father      prostate   ??? Hypertension Father      Social History   Substance Use Topics   ??? Smoking status: Former Smoker     Quit date: 02/21/1983   ??? Smokeless tobacco: Never Used   ??? Alcohol use No     ROS     General: negative for - chills, fatigue, fever, weight change  Psych - positive for anxiety  ENT: negative for - headaches, hearing change, nasal congestion, oral lesions, sneezing or sore throat, positive for bilateral ear discomfort  Heme/ Lymph: negative for - bleeding problems, bruising, pallor or swollen lymph nodes  Endo: negative for - hot flashes, polydipsia/polyuria or temperature intolerance  Resp: negative for - cough, shortness of breath or wheezing  CV: negative for - chest pain, edema or palpitations  GI: negative for - abdominal pain, change in bowel habits, constipation, diarrhea or nausea/vomiting  GU: negative for - dysuria, hematuria, incontinence, pelvic pain or vulvar/vaginal symptoms  MSK: positive for - joint pain, joint swelling or muscle pain  Neuro: negative for - confusion, headaches, seizures or weakness  Derm: negative for - dry skin, hair changes, rash or skin lesion changes    Objective:  Vitals:    12/12/14 1738   BP: 100/70   Pulse: 68   Resp: 16   Temp: 97 ??F (36.1 ??C)   SpO2: 99%   Weight: 159 lb 6.4 oz (72.3 kg)   Height: 5' 2.5" (1.588 m)   PainSc:   0 - No pain     PE    Alert, well appearing, and in no distress, oriented to person, place, and time and overweight  Mental status - alert, oriented to person, place, and time, normal mood, behavior, speech, dress, motor activity, and thought processes  Ears - ceruminosis noted, cerumen removed, hearing grossly normal bilaterally  Chest - clear to auscultation, no wheezes, rales or rhonchi, symmetric air entry  Heart -  normal rate, regular rhythm, normal S1, S2, no murmurs, rubs,  clicks or gallops  Back exam - pain with motion noted during exam, tenderness noted low back   Musculoskeletal - no joint tenderness, deformity or swelling  Extremities - peripheral pulses normal, no pedal edema, no clubbing or cyanosis    LABS   Abstract on 07/10/2014   Component Date Value Ref Range Status   ??? Creatinine, External 11/13/2013 1.03   Final       TESTS  Results for orders placed or performed in visit on 07/10/14   AMB EXT CREATININE   Result Value Ref Range    Creatinine, External 1.03      Assessment/Plan:      1. Vitamin D insufficiency  - Cholecalciferol, Vitamin D3, (VITAMIN D3) 2,000 unit cap capsule; Take 2,000 Units by mouth daily.  Dispense: 90 Cap; Refill: 2    2. Cervical pain (neck)  - oxyCODONE-acetaminophen (PERCOCET 10) 10-325 mg per tablet; Take 1 Tab by mouth every six (6) hours as needed for Pain. Max Daily Amount: 4 Tabs.  Dispense: 60 Tab; Refill: 0  - diazePAM (VALIUM) 10 mg tablet; Take 1 Tab by mouth every six (6) hours as needed for Anxiety. Max Daily Amount: 40 mg. Indications: ANXIETY, MUSCLE SPASM  Dispense: 30 Tab; Refill: 0    3. Cervical radiculopathy  - oxyCODONE-acetaminophen (PERCOCET 10) 10-325 mg per tablet; Take 1 Tab by mouth every six (6) hours as needed for Pain. Max Daily Amount: 4 Tabs.  Dispense: 60 Tab; Refill: 0    4. Acute bilateral low back pain with bilateral sciatica  - oxyCODONE-acetaminophen (PERCOCET 10) 10-325 mg per tablet; Take 1 Tab by mouth every six (6) hours as needed for Pain. Max Daily Amount: 4 Tabs.  Dispense: 60 Tab; Refill: 0    5. Muscle spasm  - diazePAM (VALIUM) 10 mg tablet; Take 1 Tab by mouth every six (6) hours as needed for Anxiety. Max Daily Amount: 40 mg. Indications: ANXIETY, MUSCLE SPASM  Dispense: 30 Tab; Refill: 0    6. Bilateral impacted cerumen  - PR REMOVAL IMPACTED CERUMEN IRRIGATION/LVG UNILAT    Lab review: no lab studies available for review at time of visit.     I have discussed the diagnosis with the patient and the intended plan as seen in the above orders.  The patient has received an after-visit summary and questions were answered concerning future plans.  I have discussed medication side effects and warnings with the patient as well.I have reviewed the plan of care with the patient, accepted their input and they are in agreement with the treatment goals.     F/U as needed    Sheela StackNelson Sarino, MD

## 2014-12-14 NOTE — Patient Instructions (Addendum)
Back Pain: Care Instructions  Your Care Instructions     Back pain has many possible causes. It is often related to problems with muscles and ligaments of the back. It may also be related to problems with the nerves, discs, or bones of the back. Moving, lifting, standing, sitting, or sleeping in an awkward way can strain the back. Sometimes you don't notice the injury until later. Arthritis is another common cause of back pain.  Although it may hurt a lot, back pain usually improves on its own within several weeks. Most people recover in 12 weeks or less. Using good home treatment and being careful not to stress your back can help you feel better sooner.  Follow-up care is a key part of your treatment and safety. Be sure to make and go to all appointments, and call your doctor if you are having problems. It???s also a good idea to know your test results and keep a list of the medicines you take.  How can you care for yourself at home?  ?? Sit or lie in positions that are most comfortable and reduce your pain. Try one of these positions when you lie down:  ?? Lie on your back with your knees bent and supported by large pillows.  ?? Lie on the floor with your legs on the seat of a sofa or chair.  ?? Lie on your side with your knees and hips bent and a pillow between your legs.  ?? Lie on your stomach if it does not make pain worse.  ?? Do not sit up in bed, and avoid soft couches and twisted positions. Bed rest can help relieve pain at first, but it delays healing. Avoid bed rest after the first day of back pain.  ?? Change positions every 30 minutes. If you must sit for long periods of time, take breaks from sitting. Get up and walk around, or lie in a comfortable position.  ?? Try using a heating pad on a low or medium setting for 15 to 20 minutes every 2 or 3 hours. Try a warm shower in place of one session with the heating pad.  ?? You can also try an ice pack for 10 to 15 minutes every 2 to 3 hours.  Put a thin cloth between the ice pack and your skin.  ?? Take pain medicines exactly as directed.  ?? If the doctor gave you a prescription medicine for pain, take it as prescribed.  ?? If you are not taking a prescription pain medicine, ask your doctor if you can take an over-the-counter medicine.  ?? Take short walks several times a day. You can start with 5 to 10 minutes, 3 or 4 times a day, and work up to longer walks. Walk on level surfaces and avoid hills and stairs until your back is better.  ?? Return to work and other activities as soon as you can. Continued rest without activity is usually not good for your back.  ?? To prevent future back pain, do exercises to stretch and strengthen your back and stomach. Learn how to use good posture, safe lifting techniques, and proper body mechanics.  When should you call for help?  Call your doctor now or seek immediate medical care if:  ?? You have new or worsening numbness in your legs.  ?? You have new or worsening weakness in your legs. (This could make it hard to stand up.)  ?? You lose control of your bladder or bowels.    Watch closely for changes in your health, and be sure to contact your doctor if:  ?? Your pain gets worse.  ?? You are not getting better after 2 weeks.  Where can you learn more?  Go to [employer]cahttp://www.healthwise.net/GoodHelpConnections  Enter I594 in the search box to learn more about "Back Pain: Care Instructions."  ?? 2006-2016 [employer]. Care instructions adapted under license by [employer] (which disclaims liability or warranty for this information). This care instruction is for use with your licensed healthcare professional. If you have questions about a medical condition or this instruction, always ask your healthcare professional. Healthwise, Inc[employer] disclaims any warranty or liability for your use of this information.  Content Version: 11.0.578772; Current as of: May 27, 2014        Earwax Blockage: Care Instructions   Your Care Instructions     Earwax is a natural substance that protects the ear canal. Normally, earwax drains from the ears and does not cause problems. Sometimes earwax builds up and hardens. Earwax blockage (also called cerumen impaction) can cause some loss of hearing and pain. When wax is tightly packed, you will need to have your doctor remove it.  Follow-up care is a key part of your treatment and safety. Be sure to make and go to all appointments, and call your doctor if you are having problems. It???s also a good idea to know your test results and keep a list of the medicines you take.  How can you care for yourself at home?  ?? Do not try to remove earwax with cotton swabs, fingers, or other objects. This can make the blockage worse and damage the eardrum.  ?? If your doctor recommends that you try to remove earwax at home:  ?? Soften and loosen the earwax with warm mineral oil. You also can try hydrogen peroxide mixed with an equal amount of room temperature water. Place 2 drops of the fluid, warmed to body temperature, in the ear two times a day for up to 5 days.  ?? Once the wax is loose and soft, all that is usually needed to remove it from the ear canal is a gentle, warm shower. Direct the water into the ear, then tip your head to let the earwax drain out. Dry your ear thoroughly with a hair dryer set on low. Hold the dryer several inches from your ear.  ?? If the warm mineral oil and shower do not work, use an over-the-counter wax softener followed by gentle flushing with an ear syringe each night for a week or two. Make sure the flushing solution is body temperature. Cool or hot fluids in the ear can cause dizziness.  When should you call for help?  Call your doctor now or seek immediate medical care if:  ?? Pus or blood drains from your ear.  ?? Your ears are ringing or feel full.  ?? You have a loss of hearing.  Watch closely for changes in your health, and be sure to contact your doctor if:   ?? You have pain or reduced hearing after 1 week of home treatment.  ?? You have any new symptoms, such as nausea or balance problems.  Where can you learn more?  Go to [employer]Stats.cahttp://www.healthwise.net/GoodHelpConnections  Enter Q495 in the search box to learn more about "Earwax Blockage: Care Instructions."  ?? 2006-2016 [employer]. Care instructions adapted under license by [employer] (which disclaims liability or warranty for this information). This care instruction is for use  with your licensed healthcare professional. If you have questions about a medical condition or this instruction, always ask your healthcare professional. [place] any warranty or liability for your use of this information.  Content Version: 11.0.578772; Current as of: May 31, 2014

## 2015-01-01 ENCOUNTER — Ambulatory Visit
Admit: 2015-01-01 | Discharge: 2015-01-01 | Payer: PRIVATE HEALTH INSURANCE | Attending: Family Medicine | Primary: Family Medicine

## 2015-01-01 ENCOUNTER — Encounter

## 2015-01-01 DIAGNOSIS — R0789 Other chest pain: Secondary | ICD-10-CM

## 2015-01-01 NOTE — Progress Notes (Signed)
Marlowe AltVanessa Mcfarland is a 54 y.o. female presents to office for ED f/o. [facility] chest pain and elevated blood pressure.       1. Have you been to the ER, urgent care clinic or hospitalized since your last visit? yes  2. [facility] other providers [facility] since your [facility] visit? yes  3. Have you had a Flu shot this year? Pt declined    Health Maintenance items with a due date reviewed with patient:  Health Maintenance Due   Topic Date Due   ??? Hepatitis C Screening  [birth year]   ??? Pneumococcal 19-64 Medium Risk (1 of 1 - PPSV23) 01/04/1977   ??? DTaP/Tdap/Td series (1 - Tdap) 01/05/1979   ??? PAP AKA CERVICAL CYTOLOGY  01/05/1979   ??? BREAST CANCER SCRN MAMMOGRAM  01/05/2008   ??? FOBT Q 1 YEAR AGE 66-75  01/05/2008   ??? INFLUENZA AGE 60 TO ADULT  08/05/2014

## 2015-01-01 NOTE — Patient Instructions (Addendum)
Chest Pain: Care Instructions  Your Care Instructions  There are many things that can cause chest pain. Some are not serious and will get better on their own in a few days. But some kinds of chest pain need more testing and treatment. Your doctor may have recommended a follow-up visit in the next 8 to 12 hours. If you are not getting better, you may need more tests or treatment.  Even though your doctor has released you, you still need to watch for any problems. The doctor carefully checked you, but sometimes problems can develop later. If you have new symptoms or if your symptoms do not get better, get medical care right away.  If you have worse or different chest pain or pressure that lasts more than 5 minutes or you passed out (lost consciousness), call 911 or seek other emergency help right away.   A medical visit is only one step in your treatment. Even if you feel better, you still need to do what your doctor recommends, such as going to all suggested follow-up appointments and taking medicines exactly as directed. This will help you recover and help prevent future problems.  How can you care for yourself at home?  ?? Rest until you feel better.  ?? Take your medicine exactly as prescribed. Call your doctor if you think you are having a problem with your medicine.  ?? Do not drive after taking a prescription pain medicine.  When should you call for help?  Call 911 if:  ?? You passed out (lost consciousness).  ?? You have severe difficulty breathing.  ?? You have symptoms of a heart attack. These may include:  ?? Chest pain or pressure, or a strange feeling in your chest.  ?? Sweating.  ?? Shortness of breath.  ?? Nausea or vomiting.  ?? Pain, pressure, or a strange feeling in your back, neck, jaw, or upper belly or in one or both shoulders or arms.  ?? Lightheadedness or sudden weakness.  ?? A fast or irregular heartbeat.  After you call 911, the operator may tell you to chew 1 adult-strength or  2 to 4 low-dose aspirin. Wait for an ambulance. Do not try to drive yourself.  Call your doctor today if:  ?? You have any trouble breathing.  ?? Your chest pain gets worse.  ?? You are dizzy or lightheaded, or you feel like you may faint.  ?? You are not getting better as expected.  ?? You are having new or different chest pain.  Where can you learn more?  Go to http://www.healthwise.net/GoodHelpConnections.  Enter A120 in the search box to learn more about "Chest Pain: Care Instructions."  Current as of: May 31, 2014  Content Version: 11.1  ?? 2006-2016 [employer], [employer]. Care instructions adapted under license by [employer] (which disclaims liability or warranty for this information). If you have questions about a medical condition or this instruction, always ask your healthcare professional. Healthwise, [employer] disclaims any warranty or liability for your use of this information.

## 2015-01-01 NOTE — Progress Notes (Signed)
Kelsey Bradley is a 54 y.o. African American female and presents with an ER F/U for chest pain that is recurrent. They did monitor   And did CXR and EKG with cardiac enzyme monitoring. She denies any illegal drug use, smoking or alcohol use. She is still able to go up 7 flights  Of stairs but lately she would need to stop.     Chief Complaint   Patient presents with   ??? ED Follow-up     Subjective:    Additional Concerns: none    Patient Active Problem List    Diagnosis Date Noted   ??? Left forearm pain 06/08/2014   ??? Left elbow pain 06/08/2014   ??? Cervical radiculopathy 05/16/2014   ??? Acute bilateral low back pain with bilateral sciatica 05/16/2014   ??? Breast pain, left 05/16/2014   ??? Abnormal weight gain 02/22/2014   ??? Hair loss 02/22/2014   ??? Fatigue 02/22/2014   ??? Cervical pain (neck) 02/22/2014   ??? Muscle spasm 02/22/2014   ??? 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 Mild intermittent asthma 11/12/2013     Current Outpatient Prescriptions   Medication Sig Dispense Refill   ??? Cholecalciferol, Vitamin D3, (VITAMIN D3) 2,000 unit cap capsule Take 2,000 Units by mouth daily. 90 Cap 2   ??? budesonide-formoterol (SYMBICORT) 160-4.5 mcg/actuation HFA inhaler INHALE 2 PUFFS BY MOUTH TWICE DAILY. 1 Inhaler 5   ??? oxyCODONE-acetaminophen (PERCOCET 10) 10-325 mg per tablet Take 1 Tab by mouth every six (6) hours as needed for Pain. Max Daily Amount: 4 Tabs. 60 Tab 0   ??? diazePAM (VALIUM) 10 mg tablet Take 1 Tab by mouth every six (6) hours as needed for Anxiety. Max Daily Amount: 40 mg. Indications: ANXIETY, MUSCLE SPASM 30 Tab 0   ??? estradiol (ESTRACE) 2 mg tablet Take 1 Tab by mouth daily. 180 Tab 0   ??? multivitamin (ONE A DAY) tablet Take 1 Tab by mouth daily.     ??? BIOTIN PO Take 5,000 mg by mouth daily.     ??? GLUCOSAMINE-CONDROITIN-HRB#182 PO Take 1 Tab by mouth daily.     ??? estradiol (VAGIFEM) 10 mcg tab vaginal tablet Insert 1 Tab into vagina every Monday, Wednesday, Friday. Three times weekly. 36 Tab 1    ??? diazepam (VALIUM) 2 mg tablet Take 5 Tabs by mouth every six (6) hours as needed for Anxiety. Max Daily Amount: 40 mg. Indications: MUSCLE SPASM 30 Tab 0   ??? meperidine (DEMEROL) 100 mg tablet Take 1 Tab by mouth every six (6) hours as needed for Pain. Max Daily Amount: 400 mg. 60 Tab 0   ??? oxyCODONE-acetaminophen (PERCOCET) 7.5-325 mg per tablet Take 1 Tab by mouth every six (6) hours as needed for Pain. Max Daily Amount: 4 Tabs. 60 Tab 0   ??? SUMAtriptan (IMITREX) 50 mg tablet Take one tab at the start of your migraine. May repeat every 2 hours, not to exceed 4 tabs in 24 hours 10 Tab 0     Allergies   Allergen Reactions   ??? Latex Rash   ??? Shellfish Derived Anaphylaxis   ??? Anaprox [Naproxen Sodium] Other (comments)     GI Bleeding   ??? Celebrex [Celecoxib] Other (comments)     GI Bleeding   ??? Codeine Itching and Nausea Only   ??? Doxycycline Unknown (comments)   ??? Motrin [Ibuprofen] Other (comments)     GI Bleeding   ??? Naprosyn [Naproxen] Other (comments)     GI  Bleeding   ??? Neurontin [Gabapentin] Unknown (comments)   ??? Norco [Hydrocodone-Acetaminophen] Other (comments)     Hair Sheds   ??? Nsaids (Non-Steroidal Anti-Inflammatory Drug) Other (comments)     Gastric bleed   ??? Singulair [Montelukast] Other (comments)     Caused Fainting   ??? Strawberry Hives and Nausea and Vomiting   ??? Sulfamethoxazole-Trimethoprim Other (comments)     Increases pain   ??? Tramadol Other (comments)     confusion     Past Medical History   Diagnosis Date   ??? Chronic neck pain    ??? Spondylosis    ??? Torticollis      Past Surgical History   Procedure Laterality Date   ??? Hx total abdominal hysterectomy     ??? Hx knee arthroscopy       x4   ??? Hx mohs procedure       x2   ??? Hx cervical fusion       Family History   Problem Relation Age of Onset   ??? Diabetes Mother    ??? Hypertension Mother    ??? Cancer Father      prostate   ??? Hypertension Father      Social History   Substance Use Topics   ??? Smoking status: Former Smoker     Quit date: 02/21/1983    ??? Smokeless tobacco: Never Used   ??? Alcohol use No     ROS     General: negative for - chills, fatigue, fever, weight change  Psych: negative for - anxiety, depression, irritability or mood swings  ENT: negative for - headaches, hearing change, nasal congestion, oral lesions, sneezing or sore throat  Heme/ Lymph: negative for - bleeding problems, bruising, pallor or swollen lymph nodes  Endo: negative for - hot flashes, polydipsia/polyuria or temperature intolerance  Resp: negative for - cough, shortness of breath or wheezing  CV: positive for - chest pain, edema, no palpitations  MSK: negative for - joint pain, joint swelling or muscle pain    Objective:  Vitals:    01/01/15 1712   BP: 130/78   Pulse: 62   Resp: 16   Temp: 97.9 ??F (36.6 ??C)   TempSrc: Oral   SpO2: 100%   Weight: 161 lb 9.6 oz (73.3 kg)   Height: 5' 2.5" (1.588 m)   PainSc:   3   PainLoc: Back     PE    Alert, well appearing, and in no distress, oriented to person, place, and time and overweight  Mental status - alert, oriented to person, place, and time, normal mood, behavior, speech, dress, motor activity, and thought processes  Chest - clear to auscultation, no wheezes, rales or rhonchi, symmetric air entry  Heart - normal rate, regular rhythm, normal S1, S2, no murmurs, rubs, clicks or gallops  Extremities - pedal edema trace.     LABS   Abstract on 07/10/2014   Component Date Value Ref Range Status   ??? Creatinine, External 11/13/2013 1.03   Final     TESTS  Results for orders placed or performed in visit on 07/10/14   AMB EXT CREATININE   Result Value Ref Range    Creatinine, External 1.03      Assessment/Plan:      Other chest pain, recurrent   - LIPID PANEL; Future  - TSH 3RD GENERATION; Future  - ECHO TTE STRESS EXERCISE TREADMILL COMP; Future  - ECHO TTE STRESS EXRCSE COMP W OR WO CONTR;  Future  - METABOLIC PANEL, COMPREHENSIVE; Future  - HEMOGLOBIN A1C WITH EAG; Future  - CBC WITH AUTOMATED DIFF; Future  - REFERRAL TO CARDIOLOGY     Lab review: no lab studies available for review at time of visit.     I have discussed the diagnosis with the patient and the intended plan as seen in the above orders.  The patient has received an after-visit summary and questions were answered concerning future plans.  I have discussed medication side effects and warnings with the patient as well.I have reviewed the plan of care with the patient, accepted their input and they are in agreement with the treatment goals.     F/U as needed.     Sheela Stack, MD

## 2015-01-02 NOTE — Telephone Encounter (Signed)
Pt called following up on order for toilet seat. She states the nurse said the order was faxed but she wasn't sure where it was sent to.

## 2015-01-08 NOTE — Telephone Encounter (Signed)
Pt returning call to Alyssa. You can call pt back at [phone]

## 2015-01-08 NOTE — Telephone Encounter (Signed)
Patient has been called with no answer. LMTRC

## 2015-01-08 NOTE — Telephone Encounter (Signed)
Patient has been called and stated that she is not sure what I was talking about but that she never called in regard to an order for a toilet seat. Patient stated that is not something that she needs. Verbal understanding given. Closing encounter.

## 2015-01-13 DIAGNOSIS — R079 Chest pain, unspecified: Secondary | ICD-10-CM | POA: Insufficient documentation

## 2015-01-14 ENCOUNTER — Ambulatory Visit
Admit: 2015-01-14 | Discharge: 2015-01-14 | Payer: PRIVATE HEALTH INSURANCE | Attending: Family Medicine | Primary: Family Medicine

## 2015-01-14 DIAGNOSIS — G8929 Other chronic pain: Secondary | ICD-10-CM

## 2015-01-14 NOTE — Progress Notes (Signed)
Kelsey Bradley is a 55 y.o. African American female and presents with chronic bilateral knee pain and possible GERD symptoms   Causing chest pain symptoms. She wanted another referral to Ortho which I believe we have been doing for the past year. I have given her   Narcotic pain meds always with the caution that I can only give it on an acute or temporary basis. She is not asking for opioid pain meds at this time.     Chief Complaint   Patient presents with   ??? Chest Pain     Patient stated that she is still having chest pain      Subjective:    Additional Concerns: none    Patient Active Problem List    Diagnosis Date Noted   ??? Left forearm pain 06/08/2014   ??? Left elbow pain 06/08/2014   ??? Cervical radiculopathy 05/16/2014   ??? Acute bilateral low back pain with bilateral sciatica 05/16/2014   ??? Breast pain, left 05/16/2014   ??? Abnormal weight gain 02/22/2014   ??? Hair loss 02/22/2014   ??? Fatigue 02/22/2014   ??? Cervical pain (neck) 02/22/2014   ??? Muscle spasm 02/22/2014   ??? 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 Mild intermittent asthma 11/12/2013     Current Outpatient Prescriptions   Medication Sig Dispense Refill   ??? estradiol (VAGIFEM) 10 mcg tab vaginal tablet Insert 1 Tab into vagina every Monday, Wednesday, Friday. Three times weekly. 36 Tab 1   ??? Cholecalciferol, Vitamin D3, (VITAMIN D3) 2,000 unit cap capsule Take 2,000 Units by mouth daily. 90 Cap 2   ??? budesonide-formoterol (SYMBICORT) 160-4.5 mcg/actuation HFA inhaler INHALE 2 PUFFS BY MOUTH TWICE DAILY. 1 Inhaler 5   ??? oxyCODONE-acetaminophen (PERCOCET 10) 10-325 mg per tablet Take 1 Tab by mouth every six (6) hours as needed for Pain. Max Daily Amount: 4 Tabs. 60 Tab 0   ??? diazePAM (VALIUM) 10 mg tablet Take 1 Tab by mouth every six (6) hours as needed for Anxiety. Max Daily Amount: 40 mg. Indications: ANXIETY, MUSCLE SPASM 30 Tab 0   ??? diazepam (VALIUM) 2 mg tablet Take 5 Tabs by mouth every six (6) hours  as needed for Anxiety. Max Daily Amount: 40 mg. Indications: MUSCLE SPASM 30 Tab 0   ??? estradiol (ESTRACE) 2 mg tablet Take 1 Tab by mouth daily. 180 Tab 0   ??? meperidine (DEMEROL) 100 mg tablet Take 1 Tab by mouth every six (6) hours as needed for Pain. Max Daily Amount: 400 mg. 60 Tab 0   ??? oxyCODONE-acetaminophen (PERCOCET) 7.5-325 mg per tablet Take 1 Tab by mouth every six (6) hours as needed for Pain. Max Daily Amount: 4 Tabs. 60 Tab 0   ??? SUMAtriptan (IMITREX) 50 mg tablet Take one tab at the start of your migraine. May repeat every 2 hours, not to exceed 4 tabs in 24 hours 10 Tab 0   ??? multivitamin (ONE A DAY) tablet Take 1 Tab by mouth daily.     ??? BIOTIN PO Take 5,000 mg by mouth daily.     ??? GLUCOSAMINE-CONDROITIN-HRB#182 PO Take 1 Tab by mouth daily.       Allergies   Allergen Reactions   ??? Latex Rash   ??? Shellfish Derived Anaphylaxis   ??? Anaprox [Naproxen Sodium] Other (comments)     GI Bleeding   ??? Celebrex [Celecoxib] Other (comments)     GI Bleeding   ??? Codeine Itching and Nausea Only   ???  Doxycycline Unknown (comments)   ??? Motrin [Ibuprofen] Other (comments)     GI Bleeding   ??? Naprosyn [Naproxen] Other (comments)     GI Bleeding   ??? Neurontin [Gabapentin] Unknown (comments)   ??? Norco [Hydrocodone-Acetaminophen] Other (comments)     Hair Sheds   ??? Nsaids (Non-Steroidal Anti-Inflammatory Drug) Other (comments)     Gastric bleed   ??? Singulair [Montelukast] Other (comments)     Caused Fainting   ??? Strawberry Hives and Nausea and Vomiting   ??? Sulfamethoxazole-Trimethoprim Other (comments)     Increases pain   ??? Tramadol Other (comments)     confusion     Past Medical History   Diagnosis Date   ??? Chronic neck pain    ??? Spondylosis    ??? Torticollis      Past Surgical History   Procedure Laterality Date   ??? Hx total abdominal hysterectomy     ??? Hx knee arthroscopy       x4   ??? Hx mohs procedure       x2   ??? Hx cervical fusion       Family History   Problem Relation Age of Onset   ??? Diabetes Mother     ??? Hypertension Mother    ??? Cancer Father      prostate   ??? Hypertension Father      Social History   Substance Use Topics   ??? Smoking status: Former Smoker     Quit date: 02/21/1983   ??? Smokeless tobacco: Never Used   ??? Alcohol use No     ROS     General: negative for - chills, fatigue, fever, weight change  Resp: negative for - cough, shortness of breath or wheezing  CV: negative for - chest pain, edema or palpitations  GI: negative for - abdominal pain, change in bowel habits, constipation, diarrhea or nausea/vomiting  GU: negative for - dysuria, hematuria, incontinence, pelvic pain or vulvar/vaginal symptoms  MSK: negative for - joint pain, joint swelling, positive for low back and bilateral knee and or muscle pain  Neuro: negative for - confusion, headaches, seizures or weakness    Objective:  Vitals:    01/14/15 1701   BP: 130/80   Pulse: 74   Resp: 16   Temp: 96.4 ??F (35.8 ??C)   TempSrc: Oral   SpO2: 100%   Weight: 160 lb (72.6 kg)   Height: 5' 2.5" (1.588 m)   PainSc:   0 - No pain     PE    Alert, well appearing, and in no distress, oriented to person, place, and time and overweight  Mental status - alert, oriented to person, place, and time, normal mood, behavior, speech, dress, motor activity, and thought processes  Chest - clear to auscultation, no wheezes, rales or rhonchi, symmetric air entry  Heart - normal rate, regular rhythm, normal S1, S2, no murmurs, rubs, clicks or gallops  Musculoskeletal - no joint tenderness, deformity or swelling  Extremities - peripheral pulses normal, no pedal edema, no clubbing or cyanosis    LABS   No visits with results within 6 Month(s) from this visit.  Latest known visit with results is:    Abstract on 07/10/2014   Component Date Value Ref Range Status   ??? Creatinine, External 11/13/2013 1.03   Final       TESTS  Results for orders placed or performed in visit on 07/10/14   AMB EXT CREATININE   Result  Value Ref Range    Creatinine, External 1.03       Assessment/Plan:      1. Chronic bilateral low back pain with right-sided sciatica  - REFERRAL TO ORTHOPEDICS    2. Chest pain in adult possibly from GERD   - REFERRAL TO GASTROENTEROLOGY    Lab review: orders written for new lab studies as appropriate; see orders.     I have discussed the diagnosis with the patient and the intended plan as seen in the above orders.  The patient has received an after-visit summary and questions were answered concerning future plans.  I have discussed medication side effects and warnings with the patient as well.I have reviewed the plan of care with the patient, accepted their input and they are in agreement with the treatment goals.     F/U as needed.     Sheela StackNelson Bryor Rami, MD

## 2015-01-14 NOTE — Progress Notes (Signed)
Marlowe AltVanessa Bradley is a 55 y.o. female presents to office for follow up on chest pain. Patient stated that she was seen by cardiology yesterday and was told that everything was normal. Stated that she should follow up as it could be her esophagus or gallbladder.           Health Maintenance items with a due date reviewed with patient:  Health Maintenance Due   Topic Date Due   ??? Hepatitis C Screening  [birth year]   ??? Pneumococcal 19-64 Medium Risk (1 of 1 - PPSV23) 01/04/1977   ??? DTaP/Tdap/Td series (1 - Tdap) 01/05/1979   ??? PAP AKA CERVICAL CYTOLOGY  01/05/1979   ??? BREAST CANCER SCRN MAMMOGRAM  01/05/2008   ??? FOBT Q 1 YEAR AGE 52-75  01/05/2008

## 2015-01-15 ENCOUNTER — Encounter

## 2015-01-15 NOTE — Telephone Encounter (Signed)
Pt also c/o swelling and fluid in her legs x 2 weeks. Pt states that she is not having any difficulty urinating. Pt is also requesting a refill on her medication. Pt advised that she might need to schedule an appointment for those medications. Call pt at [phone]

## 2015-01-15 NOTE — Telephone Encounter (Signed)
error 

## 2015-01-15 NOTE — Patient Instructions (Addendum)
Chest Pain: Care Instructions  Your Care Instructions  There are many things that can cause chest pain. Some are not serious and will get better on their own in a few days. But some kinds of chest pain need more testing and treatment. Your doctor may have recommended a follow-up visit in the next 8 to 12 hours. If you are not getting better, you may need more tests or treatment.  Even though your doctor has released you, you still need to watch for any problems. The doctor carefully checked you, but sometimes problems can develop later. If you have new symptoms or if your symptoms do not get better, get medical care right away.  If you have worse or different chest pain or pressure that lasts more than 5 minutes or you passed out (lost consciousness), call 911 or seek other emergency help right away.   A medical visit is only one step in your treatment. Even if you feel better, you still need to do what your doctor recommends, such as going to all suggested follow-up appointments and taking medicines exactly as directed. This will help you recover and help prevent future problems.  How can you care for yourself at home?  ?? Rest until you feel better.  ?? Take your medicine exactly as prescribed. Call your doctor if you think you are having a problem with your medicine.  ?? Do not drive after taking a prescription pain medicine.  When should you call for help?  Call 911 if:  ?? You passed out (lost consciousness).  ?? You have severe difficulty breathing.  ?? You have symptoms of a heart attack. These may include:  ?? Chest pain or pressure, or a strange feeling in your chest.  ?? Sweating.  ?? Shortness of breath.  ?? Nausea or vomiting.  ?? Pain, pressure, or a strange feeling in your back, neck, jaw, or upper belly or in one or both shoulders or arms.  ?? Lightheadedness or sudden weakness.  ?? A fast or irregular heartbeat.  After you call 911, the operator may tell you to chew 1 adult-strength or  2 to 4 low-dose aspirin. Wait for an ambulance. Do not try to drive yourself.  Call your doctor today if:  ?? You have any trouble breathing.  ?? Your chest pain gets worse.  ?? You are dizzy or lightheaded, or you feel like you may faint.  ?? You are not getting better as expected.  ?? You are having new or different chest pain.  Where can you learn more?  Go to http://www.healthwise.net/GoodHelpConnections.  Enter A120 in the search box to learn more about "Chest Pain: Care Instructions."  Current as of: May 31, 2014  Content Version: 11.1  ?? 2006-2016 [employer], [employer]. Care instructions adapted under license by [employer] (which disclaims liability or warranty for this information). If you have questions about a medical condition or this instruction, always ask your healthcare professional. Healthwise, [employer] disclaims any warranty or liability for your use of this information.

## 2015-01-16 MED ORDER — DIAZEPAM 10 MG TAB
10 mg | ORAL_TABLET | Freq: Four times a day (QID) | ORAL | 0 refills | Status: AC | PRN
Start: 2015-01-16 — End: ?

## 2015-01-16 MED ORDER — OXYCODONE-ACETAMINOPHEN 10 MG-325 MG TAB
10-325 mg | ORAL_TABLET | Freq: Four times a day (QID) | ORAL | 0 refills | Status: AC | PRN
Start: 2015-01-16 — End: ?

## 2015-01-16 NOTE — Telephone Encounter (Signed)
I am really going out on a limb here for this patient and pls let her know not only do we not normally give controlled substances without a visit but I cannot give them on a regular constant basis since I am not a pain management specialist. This is a high dose opioid with a benzo.  The [employer] government normally sends out a letter warning about this dangerous combination and she needed to be aware of this. No more refills after this.

## 2015-01-16 NOTE — Telephone Encounter (Signed)
Please see message below. Patient would like refill on these medications. Would you like patient to be seen again? Thank you     Requested Prescriptions     Pending Prescriptions Disp Refills   ??? oxyCODONE-acetaminophen (PERCOCET 10) 10-325 mg per tablet 60 Tab 0     Sig: Take 1 Tab by mouth every six (6) hours as needed for Pain. Max Daily Amount: 4 Tabs.   ??? diazePAM (VALIUM) 10 mg tablet 30 Tab 0     Sig: Take 1 Tab by mouth every six (6) hours as needed for Anxiety. Max Daily Amount: 40 mg. Indications: ANXIETY, MUSCLE SPASM

## 2015-01-16 NOTE — Telephone Encounter (Signed)
Pt is following up on refill request. She stated that her legs are still swollen.  Please advise.

## 2015-04-02 NOTE — Telephone Encounter (Signed)
Pt is requesting an appt with dr. Cheron SchaumannSarino, but unfortunately there're no appt until 4/11. For the past 2 weeks pt's lips have turned dark gray and itchy, she tried benadryl, aquaphor and a few others  without any improvement. Pt is stating that nothing has changed in her diet, she is drinking plenty of water.Pt would like to receive a call back at [phone].  Please advise.

## 2015-04-09 NOTE — Telephone Encounter (Signed)
Patient has been called with no answer. LMTRC

## 2015-07-05 DIAGNOSIS — L439 Lichen planus, unspecified: Secondary | ICD-10-CM | POA: Insufficient documentation

## 2015-07-16 NOTE — Telephone Encounter (Signed)
Prior authorization request for Symbicort submitted via [email or web address]. Awaiting approval or denial.

## 2015-07-16 NOTE — Telephone Encounter (Signed)
Prior auth approved. Closing encounter.

## 2015-07-28 MED ORDER — BUDESONIDE-FORMOTEROL HFA 160 MCG-4.5 MCG/ACTUATION AEROSOL INHALER
RESPIRATORY_TRACT | 5 refills | Status: AC
Start: 2015-07-28 — End: ?

## 2015-07-28 NOTE — Telephone Encounter (Signed)
Dr. Cheron Schaumann, please see refill request for patient, thank you!    Requested Prescriptions     Pending Prescriptions Disp Refills   ??? budesonide-formoterol (SYMBICORT) 160-4.5 mcg/actuation HFA inhaler 1 Inhaler 5     Sig: INHALE 2 PUFFS BY MOUTH TWICE DAILY.

## 2015-09-02 ENCOUNTER — Other Ambulatory Visit: Payer: Self-pay | Admitting: Otolaryngology

## 2015-09-02 DIAGNOSIS — R51 Headache: Principal | ICD-10-CM

## 2015-09-02 DIAGNOSIS — R519 Headache, unspecified: Secondary | ICD-10-CM

## 2015-09-02 DIAGNOSIS — G501 Atypical facial pain: Secondary | ICD-10-CM

## 2015-09-05 ENCOUNTER — Encounter: Payer: Self-pay | Admitting: Radiology

## 2015-09-05 ENCOUNTER — Ambulatory Visit
Admission: RE | Admit: 2015-09-05 | Discharge: 2015-09-05 | Disposition: A | Discharge status: Home / Self Care | Payer: Managed Care, Other (non HMO) | Source: Ambulatory Visit | Attending: Otolaryngology | Admitting: Otolaryngology

## 2015-09-05 DIAGNOSIS — G501 Atypical facial pain: Secondary | ICD-10-CM | POA: Insufficient documentation

## 2015-09-05 DIAGNOSIS — R51 Headache: Secondary | ICD-10-CM | POA: Insufficient documentation

## 2015-09-05 DIAGNOSIS — R519 Headache, unspecified: Secondary | ICD-10-CM

## 2015-10-09 ENCOUNTER — Other Ambulatory Visit: Payer: Self-pay | Admitting: Nurse Practitioner

## 2015-10-09 DIAGNOSIS — Z1231 Encounter for screening mammogram for malignant neoplasm of breast: Secondary | ICD-10-CM

## 2015-11-05 ENCOUNTER — Ambulatory Visit: Payer: Medicare Other

## 2015-12-05 DIAGNOSIS — M1711 Unilateral primary osteoarthritis, right knee: Secondary | ICD-10-CM | POA: Insufficient documentation

## 2015-12-19 DIAGNOSIS — M25551 Pain in right hip: Secondary | ICD-10-CM | POA: Insufficient documentation

## 2016-02-24 ENCOUNTER — Encounter

## 2016-03-17 DIAGNOSIS — M7542 Impingement syndrome of left shoulder: Secondary | ICD-10-CM | POA: Insufficient documentation

## 2016-03-17 DIAGNOSIS — M25512 Pain in left shoulder: Secondary | ICD-10-CM | POA: Insufficient documentation

## 2016-03-18 ENCOUNTER — Other Ambulatory Visit: Payer: Self-pay | Admitting: Unknown Physician Specialty

## 2016-03-18 DIAGNOSIS — M25512 Pain in left shoulder: Secondary | ICD-10-CM

## 2016-03-18 DIAGNOSIS — M7542 Impingement syndrome of left shoulder: Secondary | ICD-10-CM

## 2016-03-23 ENCOUNTER — Ambulatory Visit
Admission: RE | Admit: 2016-03-23 | Discharge: 2016-03-23 | Disposition: A | Discharge status: Home / Self Care | Payer: Medicare Other | Source: Ambulatory Visit | Attending: Unknown Physician Specialty | Admitting: Unknown Physician Specialty

## 2016-03-23 DIAGNOSIS — M7542 Impingement syndrome of left shoulder: Secondary | ICD-10-CM | POA: Insufficient documentation

## 2016-03-23 DIAGNOSIS — M7552 Bursitis of left shoulder: Secondary | ICD-10-CM | POA: Diagnosis not present

## 2016-03-23 DIAGNOSIS — M25512 Pain in left shoulder: Secondary | ICD-10-CM | POA: Diagnosis present

## 2016-03-23 DIAGNOSIS — S46012A Strain of muscle(s) and tendon(s) of the rotator cuff of left shoulder, initial encounter: Secondary | ICD-10-CM | POA: Insufficient documentation

## 2016-03-23 DIAGNOSIS — X58XXXA Exposure to other specified factors, initial encounter: Secondary | ICD-10-CM | POA: Diagnosis not present

## 2016-03-24 ENCOUNTER — Encounter: Payer: Self-pay | Admitting: *Deleted

## 2016-03-24 ENCOUNTER — Ambulatory Visit
Admission: EM | Admit: 2016-03-24 | Discharge: 2016-03-24 | Disposition: A | Discharge status: Home / Self Care | Payer: Medicare Other | Attending: Family Medicine | Admitting: Family Medicine

## 2016-03-24 DIAGNOSIS — H10021 Other mucopurulent conjunctivitis, right eye: Secondary | ICD-10-CM

## 2016-03-24 DIAGNOSIS — H1030 Unspecified acute conjunctivitis, unspecified eye: Secondary | ICD-10-CM

## 2016-03-24 MED ORDER — MOXIFLOXACIN HCL 0.5 % OP SOLN: 1.0000 [drp] | Freq: Three times a day (TID) | OPHTHALMIC | 0 refills | Status: DC

## 2016-03-24 NOTE — ED Provider Notes (Signed)
[facility]    CSN: [id number] Arrival date & time: 03/24/16  1010     History   Chief Complaint Chief Complaint  Patient presents with  . Eye Drainage  . Otalgia  . Ear Fullness  . Cough  . Weakness    HPI Carmen Grimes is a 56 y.o. female.    Otalgia  Associated symptoms: cough   Associated symptoms: no abdominal pain and no headaches   Ear Fullness  Pertinent negatives include no chest pain, no abdominal pain, no headaches and no shortness of breath.  Cough  Associated symptoms: ear pain   Associated symptoms: no chest pain, no headaches, no shortness of breath and no wheezing   Weakness  Pertinent negatives include no shortness of breath, no chest pain and no headaches.  URI  Presenting symptoms: cough and ear pain   Severity:  Mild Onset quality:  Sudden Timing:  Constant Progression:  Unchanged Chronicity:  New Relieved by:  Nothing Ineffective treatments:  OTC medications Associated symptoms: no headaches, no sinus pain and no wheezing   Risk factors: not elderly, no chronic cardiac disease, no chronic kidney disease, no chronic respiratory disease, no diabetes mellitus, no immunosuppression, no recent illness, no recent travel and no sick contacts   Conjunctivitis  This is a new problem. The problem occurs constantly. Pertinent negatives include no chest pain, no abdominal pain, no headaches and no shortness of breath.    History reviewed. No pertinent past medical history.  There are no active problems to display for this patient.   Past Surgical History:  Procedure Laterality Date  . KNEE ARTHROSCOPY    . SHOULDER SURGERY      OB History    No data available       Home Medications    Prior to Admission medications   Medication Sig Start Date End Date Taking? Authorizing Provider  Budesonide-Formoterol Fumarate (SYMBICORT IN) Inhale into the lungs.   Yes Historical Provider, MD  estradiol (ESTRACE) 2 MG tablet Take 2 mg by  mouth daily.   Yes Historical Provider, MD  moxifloxacin (VIGAMOX) 0.5 % ophthalmic solution Place 1 drop into the right eye 3 (three) times daily. 03/24/16   Norval Gable, MD    Family History History reviewed. No pertinent family history.  Social History Social History  Substance Use Topics  . Smoking status: Never Smoker  . Smokeless tobacco: Never Used  . Alcohol use No     Allergies   Shellfish allergy; Codeine; Celecoxib; Doxycycline; Eggs or egg-derived products; Gabapentin; Hydrocodone; Ibuprofen; Naproxen; Prednisone; Singulair [montelukast]; Tramadol; Latex; and Strawberry flavor   Review of Systems Review of Systems  HENT: Positive for ear pain. Negative for sinus pain.   Respiratory: Positive for cough. Negative for shortness of breath and wheezing.   Cardiovascular: Negative for chest pain.  Gastrointestinal: Negative for abdominal pain.  Neurological: Positive for weakness. Negative for headaches.     Physical Exam Triage Vital Signs ED Triage Vitals  Enc Vitals Group     BP 03/24/16 1043 (!) 141/79     Pulse Rate 03/24/16 1043 66     Resp 03/24/16 1043 16     Temp 03/24/16 1043 98 F (36.7 C)     Temp Source 03/24/16 1043 Oral     SpO2 03/24/16 1043 100 %     Weight 03/24/16 1045 160 lb (72.6 kg)     Height 03/24/16 1045 5\' 1"  (1.549 m)     Head Circumference --  Peak Flow --      Pain Score --      Pain Loc --      Pain Edu? --      Excl. in [place]? --    No data found.   Updated Vital Signs BP (!) 141/79 (BP Location: Left Arm)   Pulse 66   Temp 98 F (36.7 C) (Oral)   Resp 16   Ht 5\' 1"  (1.549 m)   Wt 160 lb (72.6 kg)   SpO2 100%   BMI 30.23 kg/m   Visual Acuity Right Eye Distance: 20/50 Left Eye Distance: 20/25 Bilateral Distance: 20/25  Right Eye Near:   Left Eye Near:    Bilateral Near:     Physical Exam  Constitutional: She appears well-developed and well-nourished. No distress.  Eyes: EOM are normal. Pupils are equal,  round, and reactive to light. Right eye exhibits discharge. Right conjunctiva is injected.  Skin: She is not diaphoretic.  Nursing note and vitals reviewed.    UC Treatments / Results  Labs (all labs ordered are listed, but only abnormal results are displayed) Labs Reviewed - No data to display  EKG  EKG Interpretation None       Radiology Mr Shoulder Left Wo Contrast  Result Date: 03/23/2016 CLINICAL DATA:  Left shoulder pain with limited range of motion for 1 month EXAM: MRI OF THE LEFT SHOULDER WITHOUT CONTRAST TECHNIQUE: Multiplanar, multisequence MR imaging of the shoulder was performed. No intravenous contrast was administered. COMPARISON:  None. FINDINGS: Rotator cuff: Moderate tendinosis of the supraspinatus tendon with a small partial-thickness bursal surface tear. Mild tendinosis of the infraspinatus tendon. Teres minor tendon is intact. Subscapularis tendon is intact. Muscles: No atrophy or fatty replacement of nor abnormal signal within, the muscles of the rotator cuff. Biceps long head:  Intact. Acromioclavicular Joint: Moderate arthropathy of the acromioclavicular joint. Type I acromion. Moderate amount of subacromial/subdeltoid bursal fluid. Glenohumeral Joint: No joint effusion. Mild partial-thickness cartilage loss of the glenoid with mild subchondral reactive marrow changes. Labrum: Grossly intact, but evaluation is limited by lack of intraarticular fluid. Bones: No other marrow signal abnormality. No fracture or dislocation. Other: No fluid collection or hematoma. IMPRESSION: 1. Moderate tendinosis of the supraspinatus tendon with a small partial-thickness bursal surface tear. 2. Mild tendinosis of the infraspinatus tendon. 3. Moderate subacromial/subdeltoid bursitis. 4. Mild partial-thickness cartilage loss of the glenoid with mild subchondral reactive marrow changes. Electronically Signed   By: Kathreen Devoid   On: 03/23/2016 10:17    Procedures Procedures (including  critical care time)  Medications Ordered in UC Medications - No data to display   Initial Impression / Assessment and Plan / UC Course  I have reviewed the triage vital signs and the nursing notes.  Pertinent labs & imaging results that were available during my care of the patient were reviewed by me and considered in my medical decision making (see chart for details).       Final Clinical Impressions(s) / UC Diagnoses   Final diagnoses:  Acute bacterial conjunctivitis, unspecified laterality    New Prescriptions Discharge Medication List as of 03/24/2016 12:27 PM    START taking these medications   Details  moxifloxacin (VIGAMOX) 0.5 % ophthalmic solution Place 1 drop into the right eye 3 (three) times daily., Starting Wed 03/24/2016, Normal       1.  diagnosis reviewed with patient/parent/guardian/family 2. rx as per orders above; reviewed possible side effects, interactions, risks and benefits  3. Recommend supportive  treatment with rest, fluids, cool compresses  4. Follow-up prn if symptoms worsen or don't improve   Norval Gable, MD 03/24/16 1341

## 2016-03-24 NOTE — ED Triage Notes (Signed)
Pt awoke this am with right eye drainage and irritation. Also c/o left ear fullness, and pain, hoarseness, night time cough, and general weakness.

## 2016-04-12 ENCOUNTER — Encounter: Payer: Self-pay | Admitting: Emergency Medicine

## 2016-04-12 ENCOUNTER — Ambulatory Visit
Admission: EM | Admit: 2016-04-12 | Discharge: 2016-04-12 | Disposition: A | Discharge status: Home / Self Care | Payer: Medicare Other | Attending: Family Medicine | Admitting: Family Medicine

## 2016-04-12 DIAGNOSIS — H6983 Other specified disorders of Eustachian tube, bilateral: Secondary | ICD-10-CM

## 2016-04-12 DIAGNOSIS — H9203 Otalgia, bilateral: Secondary | ICD-10-CM

## 2016-04-12 DIAGNOSIS — H6123 Impacted cerumen, bilateral: Secondary | ICD-10-CM | POA: Diagnosis not present

## 2016-04-12 NOTE — ED Triage Notes (Signed)
Patient c/o bilateral ear pain for the past 3-4 days.

## 2016-04-12 NOTE — ED Provider Notes (Signed)
[facility] ____________________________________________  Time seen: Approximately 0941 AM  I have reviewed the triage vital signs and the nursing notes.   HISTORY  Chief Complaint Otalgia (bilateral)   HPI  Carmen Grimes is a 56 y.o. female presenting for the complaints of bilateral ear discomfort, muffled hearing sensation and some intermittent pain to left ear present for the last 3-4 days. States mild left ear pain at this time. Patient reports that she started using over-the-counter Debrox kit but has not gotten any wax out. Patient reports that she was seen a few weeks ago for conjunctivitis and was told that her ears were occluded with wax. Patient denies any trauma, drainage, tinnitus, bleeding. Denies grinding her teeth at night. States she has had some runny nose and nasal congestion consistent with her seasonal allergies in which she has been taking Zyrtec, which helps. Denies fevers.  Denies chest pain, shortness of breath, abdominal pain,or rash. Denies recent sickness.    History reviewed. No pertinent past medical history.  There are no active problems to display for this patient.   Past Surgical History:  Procedure Laterality Date  . KNEE ARTHROSCOPY    . SHOULDER SURGERY       No current facility-administered medications for this encounter.   Current Outpatient Prescriptions:  .  Budesonide-Formoterol Fumarate (SYMBICORT IN), Inhale into the lungs., Disp: , Rfl:  .  estradiol (ESTRACE) 2 MG tablet, Take 2 mg by mouth daily., Disp: , Rfl:  .  moxifloxacin (VIGAMOX) 0.5 % ophthalmic solution, Place 1 drop into the right eye 3 (three) times daily., Disp: 3 mL, Rfl: 0  Allergies Shellfish allergy; Codeine; Celecoxib; Doxycycline; Eggs or egg-derived products; Gabapentin; Hydrocodone; Ibuprofen; Naproxen; Prednisone; Singulair [montelukast]; Tramadol; Latex; and Strawberry flavor  History reviewed. No pertinent family history.  Social  History Social History  Substance Use Topics  . Smoking status: Never Smoker  . Smokeless tobacco: Never Used  . Alcohol use No    Review of Systems Constitutional: No fever/chills Eyes: No visual changes. ENT: No sore throat.As above. Cardiovascular: Denies chest pain. Respiratory: Denies shortness of breath. Gastrointestinal: No abdominal pain.   Genitourinary: Negative for dysuria. Musculoskeletal: Negative for back pain. Skin: Negative for rash.   ____________________________________________   PHYSICAL EXAM:  VITAL SIGNS: ED Triage Vitals  Enc Vitals Group     BP 04/12/16 0914 119/72     Pulse Rate 04/12/16 0914 60     Resp 04/12/16 0914 16     Temp 04/12/16 0914 97.8 F (36.6 C)     Temp Source 04/12/16 0914 Oral     SpO2 --      Weight 04/12/16 0911 159 lb (72.1 kg)     Height 04/12/16 0911 _0  (1.651 m)     Head Circumference --      Peak Flow --      Pain Score 04/12/16 0911 5     Pain Loc --      Pain Edu? --      Excl. in [place]? --     Constitutional: Alert and oriented. Well appearing and in no acute distress. Eyes: Conjunctivae are normal. PERRL. EOMI. Head: Atraumatic. No sinus tenderness to palpation. No swelling. No erythema. No TMJ tenderness.  Ears: Bilateral cerumen impaction present, after cerumen removal with curette reevaluated. Left: Nontender, no erythema, minimal effusion, otherwise normal-appearing TM. Right: Nontender, no erythema, normal TMs. No surrounding tenderness, swelling or erythema present bilaterally.  Nose:Nasal congestion with clear rhinorrhea  Mouth/Throat: Mucous membranes  are moist. No pharyngeal erythema. No tonsillar swelling or exudate.  Neck: No stridor.  No cervical spine tenderness to palpation. Hematological/Lymphatic/Immunilogical: No cervical lymphadenopathy. Cardiovascular: Normal rate, regular rhythm. Grossly normal heart sounds.  Good peripheral circulation. Respiratory: Normal respiratory effort.  No  retractions. No wheezes, rales or rhonchi. Good air movement. Musculoskeletal: Ambulatory with steady gait.  Neurologic:  Normal speech and language. No gait instability. Skin:  Skin appears warm, dry and intact. No rash noted. Psychiatric: Mood and affect are normal. Speech and behavior are normal.  ___________________________________________   LABS (all labs ordered are listed, but only abnormal results are displayed)  Labs Reviewed - No data to display ____________________________________________  PROCEDURES Procedures  Procedure explained and verbal consent obtained. Bilateral cerumen impaction present, removed with curette. Patient tolerated well.  INITIAL IMPRESSION / ASSESSMENT AND PLAN / ED COURSE  Pertinent labs & imaging results that were available during my care of the patient were reviewed by me and considered in my medical decision making (see chart for details).  Well-appearing patient. No acute distress. Patient with bilateral cerumen impaction, removed with curette. Patient tolerated well. Discussed with patient suspect eustachian tube dysfunction and seasonal allergies. Encouraged patient to continue home allergy medications, avoidance of aggravating factors and triggers. Stop the debrox kit. Encourage rest, fluids and supportive care.  Discussed follow up with Primary care physician this week. Discussed follow up and return parameters including no resolution or any worsening concerns. Patient verbalized understanding and agreed to plan.   ____________________________________________   FINAL CLINICAL IMPRESSION(S) / ED DIAGNOSES  Final diagnoses:  Acute otalgia, bilateral  Eustachian tube dysfunction, bilateral     Discharge Medication List as of 04/12/2016  9:43 AM      Note: This dictation was prepared with Dragon dictation along with smaller phrase technology. Any transcriptional errors that result from this process are unintentional.         Marylene Land, NP 04/12/16 [place], NP 04/12/16 1046

## 2016-04-12 NOTE — Discharge Instructions (Signed)
° °  Follow up with your primary care physician this week as needed. Return to Urgent care for new or worsening concerns.  ° °

## 2016-06-09 ENCOUNTER — Emergency Department: Payer: Medicare Other

## 2016-06-09 ENCOUNTER — Encounter: Payer: Self-pay | Admitting: Emergency Medicine

## 2016-06-09 ENCOUNTER — Emergency Department
Admission: EM | Admit: 2016-06-09 | Discharge: 2016-06-09 | Disposition: A | Discharge status: Home / Self Care | Payer: Medicare Other | Attending: Emergency Medicine | Admitting: Emergency Medicine

## 2016-06-09 DIAGNOSIS — M546 Pain in thoracic spine: Secondary | ICD-10-CM | POA: Diagnosis present

## 2016-06-09 DIAGNOSIS — Z79899 Other long term (current) drug therapy: Secondary | ICD-10-CM | POA: Insufficient documentation

## 2016-06-09 DIAGNOSIS — Z91040 Latex allergy status: Secondary | ICD-10-CM | POA: Insufficient documentation

## 2016-06-09 DIAGNOSIS — G8929 Other chronic pain: Secondary | ICD-10-CM | POA: Diagnosis not present

## 2016-06-09 LAB — URINALYSIS, COMPLETE (UACMP) WITH MICROSCOPIC
BILIRUBIN URINE: NEGATIVE
Glucose, UA: NEGATIVE mg/dL
HGB URINE DIPSTICK: NEGATIVE
KETONES UR: NEGATIVE mg/dL
LEUKOCYTES UA: NEGATIVE
NITRITE: NEGATIVE
PROTEIN: NEGATIVE mg/dL
RBC / HPF: NONE SEEN RBC/hpf (ref 0–5)
Specific Gravity, Urine: 1.004 — ABNORMAL LOW (ref 1.005–1.030)
pH: 6 (ref 5.0–8.0)

## 2016-06-09 LAB — CBC WITH DIFFERENTIAL/PLATELET
Basophils Absolute: 0.1 10*3/uL (ref 0–0.1)
Basophils Relative: 1 %
EOS ABS: 1.2 10*3/uL — AB (ref 0–0.7)
EOS PCT: 11 %
HCT: 41 % (ref 35.0–47.0)
Hemoglobin: 13.7 g/dL (ref 12.0–16.0)
Lymphocytes Relative: 29 %
Lymphs Abs: 3 10*3/uL (ref 1.0–3.6)
MCH: 30.6 pg (ref 26.0–34.0)
MCHC: 33.4 g/dL (ref 32.0–36.0)
MCV: 91.6 fL (ref 80.0–100.0)
MONO ABS: 0.6 10*3/uL (ref 0.2–0.9)
Monocytes Relative: 6 %
Neutro Abs: 5.5 10*3/uL (ref 1.4–6.5)
Neutrophils Relative %: 53 %
PLATELETS: 313 10*3/uL (ref 150–440)
RBC: 4.47 MIL/uL (ref 3.80–5.20)
RDW: 13.1 % (ref 11.5–14.5)
WBC: 10.3 10*3/uL (ref 3.6–11.0)

## 2016-06-09 LAB — TROPONIN I

## 2016-06-09 LAB — COMPREHENSIVE METABOLIC PANEL
ALT: 13 U/L — ABNORMAL LOW (ref 14–54)
ANION GAP: 7 (ref 5–15)
AST: 28 U/L (ref 15–41)
Albumin: 4.6 g/dL (ref 3.5–5.0)
Alkaline Phosphatase: 72 U/L (ref 38–126)
BUN: 8 mg/dL (ref 6–20)
CHLORIDE: 102 mmol/L (ref 101–111)
CO2: 28 mmol/L (ref 22–32)
CREATININE: 0.73 mg/dL (ref 0.44–1.00)
Calcium: 9.7 mg/dL (ref 8.9–10.3)
GFR calc Af Amer: >60 mL/min (ref >60)
GFR calc non Af Amer: >60 mL/min (ref >60)
Glucose, Bld: 91 mg/dL (ref 65–99)
POTASSIUM: 4.9 mmol/L (ref 3.5–5.1)
SODIUM: 137 mmol/L (ref 135–145)
Total Bilirubin: 0.6 mg/dL (ref 0.3–1.2)
Total Protein: 8 g/dL (ref 6.5–8.1)

## 2016-06-09 MED ORDER — DIAZEPAM 2 MG PO TABS: 2.0000 mg | ORAL_TABLET | Freq: Three times a day (TID) | ORAL | 0 refills | Status: DC | PRN

## 2016-06-09 MED ORDER — OXYCODONE-ACETAMINOPHEN 5-325 MG PO TABS: 1.0000 | ORAL_TABLET | ORAL | 0 refills | Status: DC | PRN

## 2016-06-09 MED ORDER — OXYCODONE HCL 5 MG PO TABS
5.0000 mg | ORAL_TABLET | Freq: Once | ORAL | Status: AC
  Administered 2016-06-09: 5 mg via ORAL
  Filled 2016-06-09: qty 1

## 2016-06-09 MED ORDER — DIAZEPAM 2 MG PO TABS
2.0000 mg | ORAL_TABLET | Freq: Once | ORAL | Status: AC
  Administered 2016-06-09: 2 mg via ORAL
  Filled 2016-06-09: qty 1

## 2016-06-09 NOTE — ED Provider Notes (Signed)
ED ECG REPORT I, Darel Hong, the attending physician, personally viewed and interpreted this ECG.  Date: 06/09/2016 Rate: 59 Rhythm: Sinus bradycardia QRS Axis: normal Intervals: normal ST/T Wave abnormalities: normal Conduction Disturbances: none Narrative Interpretation: unremarkable    Darel Hong, MD 06/09/16 1424

## 2016-06-09 NOTE — ED Triage Notes (Signed)
Patient presents to the ED with intermittent back pain since March.  Patient states pain is mostly in her right upper back and has been worse for the last 3 weeks.  Patient statse area is tender and she hasn't wanted to lay down on that side.  Patient states reaching her arm causes pain to feel worse.  Patient states, "It's just not getting better."

## 2016-06-09 NOTE — ED Notes (Signed)
Pt discharged home after verbalizing understanding of discharge instructions; nad noted. 

## 2016-06-09 NOTE — ED Provider Notes (Signed)
[facility] Emergency Department Provider Note   ____________________________________________   First MD Initiated Contact with Patient 06/09/16 1339     (approximate)  I have reviewed the triage vital signs and the nursing notes.   HISTORY  Chief Complaint Back Pain    HPI Carmen Grimes is a 56 y.o. female presents to the emergency room for intermittent back pain that she is experiencing since March. Patient states it is mostly in her upper right back but also then radiates from one side to the other. Patient states that it is been worse over the last 3 weeks. She was seen today at [facility] and sent to the emergency room for "testing". Patient states that area is tender especially when she tries to lie down. She states that her range of motion of her arm increases her pain. She denies any injury and states that his "not getting any better". She denies any food dyscrasias, indigestion, burping, bloating, nausea or vomiting. She denies any cardiac disease. She denies any urinary symptoms or history of kidney stones. Patient denies any difficulty breathing. She has always been a nonsmoker. She states that over-the-counter medication has not helped with her pain. Currently she rates her pain is 7 out of 10.   History reviewed. No pertinent past medical history.  There are no active problems to display for this patient.   Past Surgical History:  Procedure Laterality Date  . KNEE ARTHROSCOPY    . SHOULDER SURGERY      Prior to Admission medications   Medication Sig Start Date End Date Taking? Authorizing Provider  Budesonide-Formoterol Fumarate (SYMBICORT IN) Inhale into the lungs.    [provider]  diazepam (VALIUM) 2 MG tablet Take 1 tablet (2 mg total) by mouth every 8 (eight) hours as needed for muscle spasms. 06/09/16   Johnn Hai, PA-C  estradiol (ESTRACE) 2 MG tablet Take 2 mg by mouth daily.    [provider]    moxifloxacin (VIGAMOX) 0.5 % ophthalmic solution Place 1 drop into the right eye 3 (three) times daily. 03/24/16   Norval Gable, MD  oxyCODONE-acetaminophen (PERCOCET) 5-325 MG tablet Take 1 tablet by mouth every 4 (four) hours as needed for severe pain. 06/09/16   Johnn Hai, PA-C    Allergies Shellfish allergy; Codeine; Celecoxib; Doxycycline; Eggs or egg-derived products; Gabapentin; Hydrocodone; Ibuprofen; Naproxen; Prednisone; Singulair [montelukast]; Tramadol; Latex; and Strawberry flavor  No family history on file.  Social History Social History  Substance Use Topics  . Smoking status: Never Smoker  . Smokeless tobacco: Never Used  . Alcohol use No    Review of Systems  Constitutional: No fever/chills Cardiovascular: Denies chest pain. Respiratory: Denies shortness of breath. Gastrointestinal: No abdominal pain.  No nausea, no vomiting.  Negative for indigestion, burping or bloating. Genitourinary: Negative for dysuria. Musculoskeletal: Positive for posterior right-sided back pain. Skin: Negative for rash. Neurological: Negative for headaches, focal weakness or numbness.   ____________________________________________   PHYSICAL EXAM:  VITAL SIGNS: ED Triage Vitals  Enc Vitals Group     BP 06/09/16 1258 (!) 158/90     Pulse Rate 06/09/16 1258 82     Resp 06/09/16 1258 16     Temp 06/09/16 1258 97.7 F (36.5 C)     Temp Source 06/09/16 1258 Oral     SpO2 06/09/16 1258 100 %     Weight 06/09/16 1259 154 lb (69.9 kg)     Height 06/09/16 1259 5\' 1"  (1.549 m)  Head Circumference --      Peak Flow --      Pain Score --      Pain Loc --      Pain Edu? --      Excl. in [place]? --     Constitutional: Alert and oriented. Well appearing and in no acute distress. Eyes: Conjunctivae are normal. PERRL. EOMI. Head: Atraumatic. Neck: No stridor.  Nontender cervical spine to palpation posteriorly. Cardiovascular: Normal rate, regular rhythm. Grossly normal heart  sounds.  Good peripheral circulation. Respiratory: Normal respiratory effort.  No retractions. Lungs CTAB. Gastrointestinal: Soft and nontender. No distention. Marland Kitchen No CVA tenderness. Musculoskeletal: Examination of the back there is no gross deformity however there is some tenderness on palpation of the right paravertebral muscles. Range of motion is restricted secondary to discomfort and increased pain for the patient. There is some minimal tenderness on palpation of the parascapular muscles. Nontender to palpate posterior ribs. No gross deformity was noted. No discoloration of the skin. Moves upper and lower extremities without any difficulty. Neurologic:  Normal speech and language. No gross focal neurologic deficits are appreciated. No gait instability. Skin:  Skin is warm, dry and intact. No rash noted. No ecchymosis, erythema or abrasions noted. Psychiatric: Mood and affect are normal. Speech and behavior are normal.  ____________________________________________   LABS (all labs ordered are listed, but only abnormal results are displayed)  Labs Reviewed  CBC WITH DIFFERENTIAL/PLATELET - Abnormal; Notable for the following:       Result Value   Eosinophils Absolute 1.2 (*)    All other components within normal limits  COMPREHENSIVE METABOLIC PANEL - Abnormal; Notable for the following:    ALT 13 (*)    All other components within normal limits  URINALYSIS, COMPLETE (UACMP) WITH MICROSCOPIC - Abnormal; Notable for the following:    Color, Urine STRAW (*)    APPearance CLEAR (*)    Specific Gravity, Urine 1.004 (*)    Bacteria, UA MANY (*)    Squamous Epithelial / LPF 0-5 (*)    All other components within normal limits  TROPONIN I   ____________________________________________  EKG  Sinus bradycardia with ventricular rate of 59. Evaluated by Dr. Mable Paris. ____________________________________________  RADIOLOGY  Dg Chest 2 View  Result Date: 06/09/2016 CLINICAL DATA:  Pain  under right shoulder blade wrapping around under left breast and occasionally radiates up her back on the right side. Pain worse the last 3 weeks and worse when raising her right arm. No known injury. Hx - cervical surgery and right shoulder surgery. EXAM: CHEST - 2 VIEW COMPARISON:  None available FINDINGS: Lungs are clear. Heart size and mediastinal contours are within normal limits. No effusion.  No pneumothorax. Orthopedic hardware projecting over the right humeral head. IMPRESSION: No acute cardiopulmonary disease. Electronically Signed   By: Lucrezia Europe M.D.   On: 06/09/2016 15:06   Dg Thoracic Spine 2 View  Result Date: 06/09/2016 CLINICAL DATA:  Back pain. EXAM: THORACIC SPINE 2 VIEWS COMPARISON:  06/09/2016 . FINDINGS: Diffuse osteopenia and degenerative change thoracic spine. No acute bony abnormality identified. Mild scoliosis concave right. Prior cervical spine fusion IMPRESSION: Diffuse osteopenia and degenerative changes with mild scoliosis concave right. No acute abnormality identified. Electronically Signed   By: Marcello Moores  Register   On: 06/09/2016 16:28    ____________________________________________   PROCEDURES  Procedure(s) performed: None  Procedures  Critical Care performed: No  ____________________________________________   INITIAL IMPRESSION / ASSESSMENT AND PLAN / ED COURSE  Pertinent  labs & imaging results that were available during my care of the patient were reviewed by me and considered in my medical decision making (see chart for details).  Patient was given Percocet and 2 mg of diazepam prior to x-rays. Lab work including urinalysis did not show any explanation for patient's back pain. Patient is extremely anxious that she has a tumor in her lung and that her son is going into the [employer] leaving her alone. Chest x-ray was reassuring that she did not have any signs of lung cancer. Thoracic spine did show degenerative changes and patient was made aware. Patient  did get relief with medication given to her while in the department. Patient was sent home with prescription for diazepam 2 mg 1 every 8 hours for the next 3 days and oxycodone as needed for pain. She is calm [facility] and make an appointment for further evaluation if continued problems. She was also given a note to remain out of work.      ____________________________________________   FINAL CLINICAL IMPRESSION(S) / ED DIAGNOSES  Final diagnoses:  Chronic right-sided thoracic back pain      NEW MEDICATIONS STARTED DURING THIS VISIT:  New Prescriptions   DIAZEPAM (VALIUM) 2 MG TABLET    Take 1 tablet (2 mg total) by mouth every 8 (eight) hours as needed for muscle spasms.   OXYCODONE-ACETAMINOPHEN (PERCOCET) 5-325 MG TABLET    Take 1 tablet by mouth every 4 (four) hours as needed for severe pain.     Note:  This document was prepared using Dragon voice recognition software and may include unintentional dictation errors.    Johnn Hai, PA-C 06/09/16 1710    Drenda Freeze, MD 06/14/16 [phone]

## 2016-06-09 NOTE — ED Notes (Signed)
Pt also reports feeling dizzy and weak several times since yesterday.

## 2016-06-09 NOTE — Discharge Instructions (Signed)
Call [facility] tomorrow for a follow-up appointment. Continue diazepam 2 mg 1 every 8 hours as needed for muscle spasms. Oxycodone 5 mg 1 every 4 hours as needed for severe pain. Be aware that this medication could cause drowsiness increase your risk for falling.

## 2016-06-09 NOTE — ED Notes (Signed)
Pt presents with back pain to her right mid back since march that was intermittent and came back yesterday worse than ever. Pt states it feels like throbbing with some stabs. Pt states pain is "deep" and that it feels like "a knot." Pt alert & oriented ; tearful during triage.

## 2016-07-12 DIAGNOSIS — F411 Generalized anxiety disorder: Secondary | ICD-10-CM | POA: Insufficient documentation

## 2016-07-12 DIAGNOSIS — F419 Anxiety disorder, unspecified: Secondary | ICD-10-CM | POA: Insufficient documentation

## 2016-07-16 DIAGNOSIS — E559 Vitamin D deficiency, unspecified: Secondary | ICD-10-CM | POA: Insufficient documentation

## 2016-11-23 ENCOUNTER — Ambulatory Visit: Payer: Medicare Other | Attending: Neurology

## 2016-11-23 DIAGNOSIS — G4710 Hypersomnia, unspecified: Secondary | ICD-10-CM | POA: Diagnosis not present

## 2016-11-23 DIAGNOSIS — G4733 Obstructive sleep apnea (adult) (pediatric): Secondary | ICD-10-CM | POA: Diagnosis present

## 2017-01-16 ENCOUNTER — Emergency Department: Payer: Medicare Other

## 2017-01-16 ENCOUNTER — Other Ambulatory Visit: Payer: Self-pay

## 2017-01-16 ENCOUNTER — Emergency Department
Admission: EM | Admit: 2017-01-16 | Discharge: 2017-01-16 | Disposition: A | Discharge status: Home / Self Care | Payer: Medicare Other | Attending: Emergency Medicine | Admitting: Emergency Medicine

## 2017-01-16 ENCOUNTER — Encounter: Payer: Self-pay | Admitting: Emergency Medicine

## 2017-01-16 DIAGNOSIS — R1011 Right upper quadrant pain: Secondary | ICD-10-CM | POA: Insufficient documentation

## 2017-01-16 DIAGNOSIS — Z79899 Other long term (current) drug therapy: Secondary | ICD-10-CM | POA: Insufficient documentation

## 2017-01-16 DIAGNOSIS — J45909 Unspecified asthma, uncomplicated: Secondary | ICD-10-CM | POA: Insufficient documentation

## 2017-01-16 DIAGNOSIS — Z91040 Latex allergy status: Secondary | ICD-10-CM | POA: Diagnosis not present

## 2017-01-16 DIAGNOSIS — R1013 Epigastric pain: Secondary | ICD-10-CM

## 2017-01-16 DIAGNOSIS — R0789 Other chest pain: Secondary | ICD-10-CM | POA: Diagnosis not present

## 2017-01-16 DIAGNOSIS — R079 Chest pain, unspecified: Secondary | ICD-10-CM | POA: Diagnosis present

## 2017-01-16 HISTORY — DX: Unspecified asthma, uncomplicated: J45.909

## 2017-01-16 LAB — HEPATIC FUNCTION PANEL
ALT: 16 U/L (ref 14–54)
AST: 25 U/L (ref 15–41)
Albumin: 4.5 g/dL (ref 3.5–5.0)
Alkaline Phosphatase: 67 U/L (ref 38–126)
BILIRUBIN TOTAL: 0.8 mg/dL (ref 0.3–1.2)
Total Protein: 7.6 g/dL (ref 6.5–8.1)

## 2017-01-16 LAB — BASIC METABOLIC PANEL
Anion gap: 8 (ref 5–15)
BUN: 9 mg/dL (ref 6–20)
CHLORIDE: 106 mmol/L (ref 101–111)
CO2: 25 mmol/L (ref 22–32)
Calcium: 9.5 mg/dL (ref 8.9–10.3)
Creatinine, Ser: 0.73 mg/dL (ref 0.44–1.00)
GFR calc non Af Amer: >60 mL/min (ref >60)
GLUCOSE: 99 mg/dL (ref 65–99)
Potassium: 4.7 mmol/L (ref 3.5–5.1)
Sodium: 139 mmol/L (ref 135–145)

## 2017-01-16 LAB — TROPONIN I: Troponin I: <0.03 ng/mL (ref <0.03)

## 2017-01-16 LAB — CBC
HCT: 38.5 % (ref 35.0–47.0)
HEMOGLOBIN: 12.7 g/dL (ref 12.0–16.0)
MCH: 30.8 pg (ref 26.0–34.0)
MCHC: 33 g/dL (ref 32.0–36.0)
MCV: 93.3 fL (ref 80.0–100.0)
PLATELETS: 365 10*3/uL (ref 150–440)
RBC: 4.12 MIL/uL (ref 3.80–5.20)
RDW: 13.7 % (ref 11.5–14.5)
WBC: 10.1 10*3/uL (ref 3.6–11.0)

## 2017-01-16 LAB — LIPASE, BLOOD: Lipase: 30 U/L (ref 11–51)

## 2017-01-16 MED ORDER — GI COCKTAIL ~~LOC~~
30.0000 mL | ORAL | Status: AC
  Administered 2017-01-16: 30 mL via ORAL
  Filled 2017-01-16: qty 30

## 2017-01-16 MED ORDER — FAMOTIDINE 20 MG PO TABS
40.0000 mg | ORAL_TABLET | Freq: Once | ORAL | Status: AC
  Administered 2017-01-16: 40 mg via ORAL
  Filled 2017-01-16: qty 2

## 2017-01-16 MED ORDER — FAMOTIDINE 20 MG PO TABS: 20.0000 mg | ORAL_TABLET | Freq: Two times a day (BID) | ORAL | 0 refills | Status: DC

## 2017-01-16 MED ORDER — ALUMINUM-MAGNESIUM-SIMETHICONE 200-200-20 MG/5ML PO SUSP: 30.0000 mL | Freq: Three times a day (TID) | ORAL | 0 refills | Status: DC

## 2017-01-16 NOTE — ED Provider Notes (Signed)
[facility] Emergency Department Provider Note  ____________________________________________  Time seen: Approximately 4:26 PM  I have reviewed the triage vital signs and the nursing notes.   HISTORY  Chief Complaint Chest Pain    HPI Carmen Grimes is a 57 y.o. female who complains of what she describes as chest pain that started right as she was starting lunchtime today. She indicates her epigastrium as the source of her pain. It radiates through to her back. It is sharp, lasting a few seconds at a time and then resolving, recurrent falls or episodes today. Worse with food. No alleviating factors. Moderate intensity. She reports that for the last 2 days she's been having some discomfort in her right upper abdomen when she eats.     Past Medical History:  Diagnosis Date  . Asthma      There are no active problems to display for this patient.    Past Surgical History:  Procedure Laterality Date  . ABDOMINAL HYSTERECTOMY    . KNEE ARTHROSCOPY    . KNEE SURGERY    . SHOULDER SURGERY    . SHOULDER SURGERY       Prior to Admission medications   Medication Sig Start Date End Date Taking? Authorizing Provider  aluminum-magnesium hydroxide-simethicone (MAALOX) [phone] MG/5ML SUSP Take 30 mLs by mouth 4 (four) times daily -  before meals and at bedtime. 01/16/17   Carrie Mew, MD  Budesonide-Formoterol Fumarate (SYMBICORT IN) Inhale into the lungs.    [provider]  diazepam (VALIUM) 2 MG tablet Take 1 tablet (2 mg total) by mouth every 8 (eight) hours as needed for muscle spasms. 06/09/16   Johnn Hai, PA-C  estradiol (ESTRACE) 2 MG tablet Take 2 mg by mouth daily.    [provider]  famotidine (PEPCID) 20 MG tablet Take 1 tablet (20 mg total) by mouth 2 (two) times daily. 01/16/17   Carrie Mew, MD  moxifloxacin (VIGAMOX) 0.5 % ophthalmic solution Place 1 drop into the right eye 3 (three) times daily. 03/24/16    Norval Gable, MD  oxyCODONE-acetaminophen (PERCOCET) 5-325 MG tablet Take 1 tablet by mouth every 4 (four) hours as needed for severe pain. 06/09/16   Johnn Hai, PA-C     Allergies Shellfish allergy; Codeine; Celecoxib; Doxycycline; Eggs or egg-derived products; Gabapentin; Hydrocodone; Ibuprofen; Naproxen; Prednisone; Singulair [montelukast]; Tramadol; Latex; and Strawberry flavor   History reviewed. No pertinent family history.  Social History Social History   Tobacco Use  . Smoking status: Never Smoker  . Smokeless tobacco: Never Used  Substance Use Topics  . Alcohol use: No  . Drug use: No    Review of Systems  Constitutional:   No fever or chills.  ENT:   No sore throat. No rhinorrhea. Cardiovascular:   No chest pain or syncope. Respiratory:   No dyspnea or cough. Gastrointestinal:   Positive as above for epigastric pain without vomiting and diarrhea.  Musculoskeletal:   Negative for focal pain or swelling All other systems reviewed and are negative except as documented above in ROS and HPI.  ____________________________________________   PHYSICAL EXAM:  VITAL SIGNS: ED Triage Vitals  Enc Vitals Group     BP 01/16/17 1529 (!) 152/76     Pulse Rate 01/16/17 1529 86     Resp 01/16/17 1529 18     Temp 01/16/17 1529 98 F (36.7 C)     Temp Source 01/16/17 1529 Oral     SpO2 01/16/17 1529 100 %  Weight 01/16/17 1526 157 lb (71.2 kg)     Height 01/16/17 1526 5\' 1"  (1.549 m)     Head Circumference --      Peak Flow --      Pain Score 01/16/17 1612 3     Pain Loc --      Pain Edu? --      Excl. in [place]? --     Vital signs reviewed, nursing assessments reviewed.   Constitutional:   Alert and oriented. Well appearing and in no distress. Eyes:   No scleral icterus.  EOMI. No nystagmus. No conjunctival pallor. PERRL. ENT   Head:   Normocephalic and atraumatic.   Nose:   No congestion/rhinnorhea.    Mouth/Throat:   MMM, no pharyngeal  erythema. No peritonsillar mass.    Neck:   No meningismus. Full ROM. Hematological/Lymphatic/Immunilogical:   No cervical lymphadenopathy. Cardiovascular:   RRR. Symmetric bilateral radial and DP pulses.  No murmurs.  Respiratory:   Normal respiratory effort without tachypnea/retractions. Breath sounds are clear and equal bilaterally. No wheezes/rales/rhonchi. Gastrointestinal:   Soft with epigastric and right upper quadrant tenderness. Non distended. There is no CVA tenderness.  No rebound, rigidity, or guarding. Genitourinary:   deferred Musculoskeletal:   Normal range of motion in all extremities. No joint effusions.  No lower extremity tenderness.  No edema. Neurologic:   Normal speech and language.  Motor grossly intact. No gross focal neurologic deficits are appreciated.  Skin:    Skin is warm, dry and intact. No rash noted.  No petechiae, purpura, or bullae.  ____________________________________________    LABS (pertinent positives/negatives) (all labs ordered are listed, but only abnormal results are displayed) Labs Reviewed  HEPATIC FUNCTION PANEL - Abnormal; Notable for the following components:      Result Value   Bilirubin, Direct <0.1 (*)    All other components within normal limits  BASIC METABOLIC PANEL  CBC  TROPONIN I  LIPASE, BLOOD  POC URINE PREG, ED   ____________________________________________   EKG  Interpreted by me Normal sinus rhythm rate of 85, normal axis intervals QRS ST segments and T waves  ____________________________________________    RADIOLOGY  Dg Chest 2 View  Result Date: 01/16/2017 CLINICAL DATA:  57 y/o  F; central chest pain radiating to the back. EXAM: CHEST  2 VIEW COMPARISON:  06/09/2016 chest radiograph FINDINGS: Stable heart size and mediastinal contours are within normal limits. Both lungs are clear. Anterior cervical fusion and right rotator cuff repair hardware. No acute osseous abnormality identified. IMPRESSION: No  acute pulmonary process identified.  Stable cardiac silhouette. Electronically Signed   By: Kristine Garbe M.D.   On: 01/16/2017 16:05   US Abdomen Limited Ruq  Result Date: 01/16/2017 CLINICAL DATA:  57 y/o F; epigastric and right upper quadrant pain for 3 days. EXAM: ULTRASOUND ABDOMEN LIMITED RIGHT UPPER QUADRANT COMPARISON:  None. FINDINGS: Gallbladder: Contracted gallbladder. No gallstones or wall thickening visualized. No sonographic Murphy sign noted by sonographer. Common bile duct: Diameter: 3.3 mm Liver: No focal lesion identified. Mildly increased liver echogenicity. Portal vein is patent on color Doppler imaging with normal direction of blood flow towards the liver. IMPRESSION: 1. No acute process identified. 2. Mildly increased liver echogenicity, possible steatosis. Electronically Signed   By: Kristine Garbe M.D.   On: 01/16/2017 16:57    ____________________________________________   PROCEDURES Procedures  ____________________________________________  DIFFERENTIAL DIAGNOSIS Pancreatitis, gastritis, GERD, cholecystitis.  CLINICAL IMPRESSION / ASSESSMENT AND PLAN / ED COURSE  Pertinent labs &  imaging results that were available during my care of the patient were reviewed by me and considered in my medical decision making (see chart for details).    Clinical Course as of Jan 17 1831  Sun Jan 16, 2017  1606 Pt p/w epigastric pain, ruq tenderness. Check labs, [place] ruq. Gi cocktail. Considering the patient's symptoms, medical history, and physical examination today, I have low suspicion for ACS, PE, TAD, pneumothorax, carditis, mediastinitis, pneumonia, CHF, or sepsis.    [PS]    Clinical Course User Index [PS] Carrie Mew, MD     ----------------------------------------- 6:33 PM on 01/16/2017 -----------------------------------------  Workup unremarkable. Suitable for outpatient follow-up. Trial of Pepcid outpatient for most likely GERD. I  have low suspicion for cholecystitis or biliary pathology, pancreatitis, perforation or bowel obstruction, hernia, intra-abdominal abscess, AAA or dissection, volvulus or intussusception, mesenteric ischemia, or appendicitis.    ____________________________________________   FINAL CLINICAL IMPRESSION(S) / ED DIAGNOSES    Final diagnoses:  Atypical chest pain  Epigastric pain       Portions of this note were generated with dragon dictation software. Dictation errors may occur despite best attempts at proofreading.    Carrie Mew, MD 01/16/17 [phone]

## 2017-01-16 NOTE — Discharge Instructions (Signed)
your labs, chest x-ray, and ultrasound of the upper abdomen were unremarkable today. Follow-up with primary care for continued monitoring of your symptoms. Continue taking an antacid to see if this helps with your symptoms.  Results for orders placed or performed during the hospital encounter of 78/93/81  Basic metabolic panel  Result Value Ref Range   Sodium 139 135 - 145 mmol/L   Potassium 4.7 3.5 - 5.1 mmol/L   Chloride 106 101 - 111 mmol/L   CO2 25 22 - 32 mmol/L   Glucose, Bld 99 65 - 99 mg/dL   BUN 9 6 - 20 mg/dL   Creatinine, Ser 0.73 0.44 - 1.00 mg/dL   Calcium 9.5 8.9 - 10.3 mg/dL   GFR calc non Af Amer >60 >60 mL/min   GFR calc Af Amer >60 >60 mL/min   Anion gap 8 5 - 15  CBC  Result Value Ref Range   WBC 10.1 3.6 - 11.0 K/uL   RBC 4.12 3.80 - 5.20 MIL/uL   Hemoglobin 12.7 12.0 - 16.0 g/dL   HCT 38.5 35.0 - 47.0 %   MCV 93.3 80.0 - 100.0 fL   MCH 30.8 26.0 - 34.0 pg   MCHC 33.0 32.0 - 36.0 g/dL   RDW 13.7 11.5 - 14.5 %   Platelets 365 150 - 440 K/uL  Troponin I  Result Value Ref Range   Troponin I <0.03 <0.03 ng/mL  Hepatic function panel  Result Value Ref Range   Total Protein 7.6 6.5 - 8.1 g/dL   Albumin 4.5 3.5 - 5.0 g/dL   AST 25 15 - 41 U/L   ALT 16 14 - 54 U/L   Alkaline Phosphatase 67 38 - 126 U/L   Total Bilirubin 0.8 0.3 - 1.2 mg/dL   Bilirubin, Direct <0.1 (L) 0.1 - 0.5 mg/dL   Indirect Bilirubin NOT CALCULATED 0.3 - 0.9 mg/dL  Lipase, blood  Result Value Ref Range   Lipase 30 11 - 51 U/L   Dg Chest 2 View  Result Date: 01/16/2017 CLINICAL DATA:  57 y/o  F; central chest pain radiating to the back. EXAM: CHEST  2 VIEW COMPARISON:  06/09/2016 chest radiograph FINDINGS: Stable heart size and mediastinal contours are within normal limits. Both lungs are clear. Anterior cervical fusion and right rotator cuff repair hardware. No acute osseous abnormality identified. IMPRESSION: No acute pulmonary process identified.  Stable cardiac silhouette.  Electronically Signed   By: Kristine Garbe M.D.   On: 01/16/2017 16:05   US Abdomen Limited Ruq  Result Date: 01/16/2017 CLINICAL DATA:  57 y/o F; epigastric and right upper quadrant pain for 3 days. EXAM: ULTRASOUND ABDOMEN LIMITED RIGHT UPPER QUADRANT COMPARISON:  None. FINDINGS: Gallbladder: Contracted gallbladder. No gallstones or wall thickening visualized. No sonographic Murphy sign noted by sonographer. Common bile duct: Diameter: 3.3 mm Liver: No focal lesion identified. Mildly increased liver echogenicity. Portal vein is patent on color Doppler imaging with normal direction of blood flow towards the liver. IMPRESSION: 1. No acute process identified. 2. Mildly increased liver echogenicity, possible steatosis. Electronically Signed   By: Kristine Garbe M.D.   On: 01/16/2017 16:57

## 2017-01-16 NOTE — ED Triage Notes (Signed)
First Nurse Note:  Arrives with Chest Pain , onset 1 hour PTA.

## 2017-01-16 NOTE — ED Triage Notes (Signed)
C/o central chest pain that radiated through to back about 30 min ago.  Pain is gone right not but she has had some intermittent pain since initially starting. Denies other symptoms including SHOB, nausea, sweating.

## 2017-01-20 ENCOUNTER — Ambulatory Visit: Payer: Self-pay | Admitting: Certified Nurse Midwife

## 2017-01-24 ENCOUNTER — Encounter: Payer: Self-pay | Admitting: Certified Nurse Midwife

## 2017-01-24 ENCOUNTER — Ambulatory Visit (INDEPENDENT_AMBULATORY_CARE_PROVIDER_SITE_OTHER): Payer: Medicare Other | Admitting: Certified Nurse Midwife

## 2017-01-24 VITALS — BP 122/82 | HR 70 | Ht 61.0 in | Wt 160.0 lb

## 2017-01-24 DIAGNOSIS — Z124 Encounter for screening for malignant neoplasm of cervix: Secondary | ICD-10-CM

## 2017-01-24 DIAGNOSIS — Z79890 Hormone replacement therapy (postmenopausal): Secondary | ICD-10-CM

## 2017-01-24 DIAGNOSIS — Z5181 Encounter for therapeutic drug level monitoring: Secondary | ICD-10-CM

## 2017-01-24 DIAGNOSIS — Z1239 Encounter for other screening for malignant neoplasm of breast: Secondary | ICD-10-CM

## 2017-01-24 DIAGNOSIS — Z01419 Encounter for gynecological examination (general) (routine) without abnormal findings: Secondary | ICD-10-CM

## 2017-01-24 MED ORDER — ESTRADIOL 1 MG PO TABS: 1.0000 mg | ORAL_TABLET | Freq: Every day | ORAL | 3 refills | Status: DC

## 2017-01-24 NOTE — Progress Notes (Signed)
Gynecology Annual Exam  PCP: System, Pcp Not In  Chief Complaint:  Chief Complaint  Patient presents with  . Gynecologic Exam    History of Present Illness: Carmen Grimes is a 57 y.o. G3 P71 Black female, originally from [place],  who presents for a NP gyn exam.. The patient has no complaints today.  She had a LSH and BSO in 2002 for fibroids, pelvic pain and AUB and has been on Estrace 2 mgm since then. She recently tried to wean off of the ET, but resumed the estrogen when her vasomotor symptoms returned.  Last pap smear: 2015, results were NIL. Remote history of abnormal Pap in 1985, biopsies were negative.   The patient is not currently sexually active.  She has not been sexually active since 2007.  Her past medical history is remarkable for asthma (mild intermittent), a GI bleed/peptic ulcer, osteoarthritis of the hand and knees, and rotator cuff tear. She is a vegan.  The patient does perform self breast exams. Her last mammogram was 04/22/2014 in [facility], results were negative.   There is no family history of breast cancer.   There is no family history of ovarian cancer.   Her last colonoscopy 2013 (age 57), results: normal. Next due in 2023 (10 years).  The patient denies smoking.  She denies drinking alcohol.  She denies illegal drug use.  The patient reports exercising regularly.  The patient denies current symptoms of depression.    Review of Systems: Review of Systems  Constitutional: Positive for malaise/fatigue. Negative for chills, fever and weight loss.  HENT: Negative for congestion, sinus pain and sore throat.   Eyes: Negative for blurred vision and pain.  Respiratory: Negative for hemoptysis, shortness of breath and wheezing.   Cardiovascular: Negative for chest pain, palpitations and leg swelling.  Gastrointestinal: Negative for abdominal pain, blood in stool, diarrhea, heartburn, nausea and vomiting.  Genitourinary: Negative for dysuria,  frequency, hematuria and urgency.  Musculoskeletal: Positive for joint pain. Negative for back pain and myalgias.  Skin: Negative for itching and rash.  Neurological: Negative for dizziness, tingling and headaches.  Endo/Heme/Allergies: Negative for environmental allergies and polydipsia. Does not bruise/bleed easily.       Negative for hirsutism   Psychiatric/Behavioral: Negative for depression. The patient is not nervous/anxious and does not have insomnia.     Past Medical History:  Past Medical History:  Diagnosis Date  . Asthma   . GERD (gastroesophageal reflux disease)   . H/O: GI bleed   . Osteoarthritis     Past Surgical History:  Past Surgical History:  Procedure Laterality Date  . ANTERIOR FUSION CERVICAL SPINE  2009  . COLONOSCOPY  2013   normal  . ESOPHAGOGASTRODUODENOSCOPY     GI bleed  . KNEE ARTHROSCOPY Right    x3  . KNEE SURGERY Left    Arthroscopy x1  . LAPAROSCOPIC SUPRACERVICAL HYSTERECTOMY  2002   with BSO for fibroids/ AUB/ pelvic pain  . LAPAROSCOPIC TUBAL LIGATION  1982  . SHOULDER SURGERY Right    rotator cuff repair  . SHOULDER SURGERY Right    second rotator cuff repair  . tubal reanastamosis  1985    Family History:  Family History  Problem Relation Age of Onset  . Diabetes Mother   . Dementia Mother        Alzheimers  . Hypertension Mother   . Glaucoma Mother   . Prostate cancer Father 64  . Stroke Father   .  Arthritis Father   . Osteoporosis Father   . Prostate cancer Brother     Social History:  Social History   Socioeconomic History  . Marital status: Divorced    Spouse name: Not on file  . Number of children: 3  . Years of education: Not on file  . Highest education level: Not on file  Social Needs  . Financial resource strain: Not on file  . Food insecurity - worry: Not on file  . Food insecurity - inability: Not on file  . Transportation needs - medical: Not on file  . Transportation needs - non-medical: Not on  file  Occupational History  . 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 Smoking status: Never Smoker  . Smokeless tobacco: Never Used  Substance and Sexual Activity  . Alcohol use: No  . Drug use: No  . Sexual activity: Not Currently    Birth control/protection: Post-menopausal  Other Topics Concern  . Not on file  Social History Narrative  . Not on file    Allergies:  Allergies  Allergen Reactions  . Shellfish Allergy Anaphylaxis    Other reaction(s): anaphylaxis/angioedema Other reaction(s): anaphylaxis/angioedema  . Codeine Itching and Nausea Only    Other reaction(s): mild rash/itching  . Strawberry Extract Hives, Nausea Only and Nausea And Vomiting  . Hydrocodone Other (See Comments)    Hair falls out.  Marland Kitchen Hydrocodone-Acetaminophen     Other reaction(s): other/intolerance HAIR FALLS OUT   . Nsaids Other (See Comments) and Nausea And Vomiting    Gastric bleed Gastric bleed Gastric bleed  Gastric bleed   . Prednisone Other (See Comments)    Tremors  . Singulair  [Montelukast Sodium]   . Singulair [Montelukast] Other (See Comments)    Syncope  . Sulfamethoxazole-Trimethoprim Other (See Comments)    Other reaction(s): Other (See Comments), other/intolerance MAKES PAIN WORSE MAKES PAIN WORSE  Increases pain Increases pain   . Tramadol Other (See Comments)    Unknown reaction.  . Celecoxib Other (See Comments)    GI bleed Other reaction(s): gi distress, Other (comments), Other (See Comments) GI BLEED GI bleeding  GI BLEED  GI Bleeding GI Bleeding   . Doxycycline Other (See Comments)    Unknown reaction. Other reaction(s): Other (See Comments), Unknown, Unknown (comments) Pt does not remember   . Eggs Or Egg-Derived Products Other (See Comments) and Rash    Unknown reaction.  . Gabapentin Other (See Comments)    Drowsiness. Other reaction(s): Other (See Comments), Unknown, Unknown (comments) Drowsy   . Ibuprofen Other (See Comments)      "Blood disorder", GI bleed. Other reaction(s): gi distress, Other (comments), Other (See Comments) GI bleeding GI BLEED  GI Bleeding GI BLEED GI Bleeding   . Latex Rash    Other reaction(s): mild rash/itching  . Naproxen Other (See Comments)    GI bleed Other reaction(s): Other (comments), Other (See Comments) GI Bleeding Gi bleeding GI BLEED  GI Bleeding  GI Bleeding   . Strawberry Flavor Hives    Medications: Current Outpatient Medications on File Prior to Visit  Medication Sig Dispense Refill  . acetaminophen (TYLENOL) 500 MG tablet Take by mouth.    . clobetasol ointment (TEMOVATE) 0.05 % clobetasol 0.05 % topical ointment    . diclofenac sodium (VOLTAREN) 1 % GEL APPLY 2 G TOPICALLY 3 (THREE) TIMES DAILY AS NEEDED  0  . diphenhydrAMINE-zinc acetate (BENADRYL) cream Use 1 Application to affected area 2 Times Daily As Needed. external application only    .  ergocalciferol (VITAMIN D2) 50000 units capsule Vitamin D2 50,000 unit capsule  TAKE ONE CAPSULE BY MOUTH EVERY 7 DAYS    . Misc Natural Products (GLUCOSAMINE CHOND COMPLEX/MSM PO) Take by mouth.    Marland Kitchen omeprazole (PRILOSEC) 40 MG capsule Take by mouth daily.  0  . therapeutic multivitamin-minerals (THERAGRAN-M) tablet Take 1 Tab by Mouth Every Other Day.     No current facility-administered medications on file prior to visit.   and Estrace 2 mgm daily  Physical Exam:  Vital Signs: BP 122/82   Pulse 70   Ht 5\' 1"  (1.549 m)   Wt 160 lb (72.6 kg)   BMI 30.23 kg/m  General: BF in NAD HEENT: normocephalic, anicteric Neck: no thyroid enlargement, no palpable nodules, no cervical lymphadenopathy  Pulmonary: No increased work of breathing, CTAB Cardiovascular: RRR, without murmur  Breast: Breast symmetrical, no tenderness, no palpable nodules or masses, no skin or nipple retraction present, no nipple discharge.  No axillary, infraclavicular or supraclavicular lymphadenopathy. Abdomen: Soft, non-tender,  non-distended.  Umbilicus without lesions.  No hepatomegaly or masses palpable. No evidence of hernia. Genitourinary:  External: Normal external female genitalia.  Normal urethral meatus, normal Bartholin's and Skene's glands.    Vagina: Normal vaginal mucosa, no evidence of prolapse, tender on digital exam, moist, small amount clear to white discharge    Cervix: Grossly normal in appearance, no bleeding, non-tender  Uterus: surgically absent  Adnexa: No adnexal masses, non-tender  Rectal: deferred  Lymphatic: no evidence of inguinal lymphadenopathy Extremities: no edema, erythema, or tenderness Neurologic: Grossly intact Psychiatric: mood appropriate, affect full     Assessment: 57 y.o. NP gyn exam Estrogen therapy s/p surgical menopause  Plan:   1) Breast cancer screening - recommend monthly self breast exam and annual screening mammograms. Mammogram was ordered today.  2) Colon cancer screening-UTD. Colonoscopy die 2023   3) Cervical cancer screening - Pap was done. ASCCP guidelines and rational discussed.  Patient opts for every 3 years screening interval  4) Estrogen therapy: Discussed trying to decrease dose to 1 mgm daily, ie, lowest dose that takes care of the vasomotor sx. Patient agrees and RX sent to pharmacy.  5) Routine healthcare maintenance including cholesterol and diabetes screening managed by PCP   6). RTO 1 year and prn.  Dalia Heading, CNM

## 2017-01-25 ENCOUNTER — Telehealth: Payer: Self-pay | Admitting: Certified Nurse Midwife

## 2017-01-25 ENCOUNTER — Encounter: Payer: Self-pay | Admitting: Certified Nurse Midwife

## 2017-01-25 ENCOUNTER — Telehealth: Payer: Self-pay

## 2017-01-25 DIAGNOSIS — M1990 Unspecified osteoarthritis, unspecified site: Secondary | ICD-10-CM | POA: Insufficient documentation

## 2017-01-25 DIAGNOSIS — J45909 Unspecified asthma, uncomplicated: Secondary | ICD-10-CM | POA: Insufficient documentation

## 2017-01-25 DIAGNOSIS — K219 Gastro-esophageal reflux disease without esophagitis: Secondary | ICD-10-CM | POA: Insufficient documentation

## 2017-01-25 DIAGNOSIS — Z8719 Personal history of other diseases of the digestive system: Secondary | ICD-10-CM | POA: Insufficient documentation

## 2017-01-25 NOTE — Telephone Encounter (Signed)
Has  had some cramping since Pap smear yesterday...intermittent, mild, not needing medication. Not spotting. No diarrhea, fever, chills, dysuria. Advised to let me know if sx persist. Would check for UTI. (has had LSH and BSO).

## 2017-01-25 NOTE — Telephone Encounter (Signed)
Pt states she was seen yesterday by CLG.  A pap was done and she has not had one in several years, nor has she been sexually active in quite awhile. Yesterday when she got home for 1-2 hours she experienced abdominal cramping. This afternoon it has started again. She thinks it is because of the exam but wants to talk to [place]. CB# [phone]

## 2017-01-26 LAB — IGP, APTIMA HPV
HPV APTIMA: NEGATIVE
PAP SMEAR COMMENT: 0

## 2017-02-01 DIAGNOSIS — M778 Other enthesopathies, not elsewhere classified: Secondary | ICD-10-CM | POA: Insufficient documentation

## 2017-02-01 DIAGNOSIS — M659 Synovitis and tenosynovitis, unspecified: Secondary | ICD-10-CM | POA: Insufficient documentation

## 2017-02-17 ENCOUNTER — Other Ambulatory Visit: Payer: Self-pay | Admitting: Certified Nurse Midwife

## 2017-02-17 ENCOUNTER — Telehealth: Payer: Self-pay

## 2017-02-17 MED ORDER — ESTRADIOL 2 MG PO TABS: 2.0000 mg | ORAL_TABLET | Freq: Every day | ORAL | 3 refills | Status: DC

## 2017-02-17 NOTE — Telephone Encounter (Signed)
Pt states at last office visit Carmen Grimes recommended decreasing estrogen to 1 mg instead of 2 mg. Pt has been unable to tolerate only taking 1 mg due to severe hot flashes and has increased her dose back to 2 mg. Pt requests new rx for 2 mg sent to pharmacy. Cb# [phone] thank you

## 2017-02-17 NOTE — Telephone Encounter (Signed)
RX sent to [facility]

## 2017-02-17 NOTE — Telephone Encounter (Signed)
Pt aware and appreciative  

## 2017-03-28 ENCOUNTER — Telehealth: Payer: Self-pay

## 2017-03-28 NOTE — Telephone Encounter (Signed)
Pt has mammogram order available but per [place] recommended to be seen by provider due to breast changes with soreness and tenderness. Pt plans to have breast exam on 03/30/17 at 3:30 with [place].

## 2017-03-28 NOTE — Telephone Encounter (Signed)
Pt states since Saturday she has had a discomfort from her right arm pit to the right breast.  She tried to schedule mammogram at [facility].  She spoke to [facility] who asked if there were any changes and she told Melissa 'yes'.  Melissa adv pt to contact her provider ([place]) [phone]

## 2017-04-01 ENCOUNTER — Ambulatory Visit: Payer: Medicare Other | Admitting: Certified Nurse Midwife

## 2017-04-05 ENCOUNTER — Encounter: Payer: Self-pay | Admitting: Certified Nurse Midwife

## 2017-04-05 ENCOUNTER — Ambulatory Visit (INDEPENDENT_AMBULATORY_CARE_PROVIDER_SITE_OTHER): Payer: Medicare Other | Admitting: Certified Nurse Midwife

## 2017-04-05 VITALS — BP 110/68 | HR 64 | Ht 61.0 in | Wt 162.0 lb

## 2017-04-05 DIAGNOSIS — R0789 Other chest pain: Secondary | ICD-10-CM | POA: Diagnosis not present

## 2017-04-05 NOTE — Progress Notes (Signed)
Obstetrics & Gynecology Office Visit   Chief Complaint:  Chief Complaint  Patient presents with  . Breast check    Right breast soreness/tenderness    History of Present Illness: 57 year old BF presents with complaints of right axillary tenderness that radiates to the outer right breast x 5 days. The pain is achy, not sharp. The area is tender to superficial palpation. She denies any aggravating or alleviating factors. Has not taken any analgesics for the pain. Does report having a cold with a cough 2 weeks ago. Denies any trauma. Had her annual gyn exam 01/24/2017 which was normal. Had not scheduled her mammogram yet. Has been taking 2 mgm estrodiol po daily since her [place] and BSO. Review of Systems:  ROS -see HPI  Past Medical History:  Past Medical History:  Diagnosis Date  . Asthma   . GERD (gastroesophageal reflux disease)   . H/O: GI bleed   . Osteoarthritis     Past Surgical History:  Past Surgical History:  Procedure Laterality Date  . ANTERIOR FUSION CERVICAL SPINE  2009  . COLONOSCOPY  2013   normal  . ESOPHAGOGASTRODUODENOSCOPY     GI bleed  . KNEE ARTHROSCOPY Right    x3  . KNEE SURGERY Left    Arthroscopy x1  . LAPAROSCOPIC SUPRACERVICAL HYSTERECTOMY  2002   with BSO for fibroids/ AUB/ pelvic pain  . LAPAROSCOPIC TUBAL LIGATION  1982  . SHOULDER SURGERY Right    rotator cuff repair  . SHOULDER SURGERY Right    second rotator cuff repair  . tubal reanastamosis  1985    Gynecologic History: No LMP recorded. Patient has had a hysterectomy.  Obstetric History: [id number]  Family History:  Family History  Problem Relation Age of Onset  . Diabetes Mother   . Dementia Mother        Alzheimers  . Hypertension Mother   . Glaucoma Mother   . Prostate cancer Father 21  . Stroke Father   . Arthritis Father   . Osteoporosis Father   . Prostate cancer Brother     Social History:  Social History   Socioeconomic History  . Marital status: Divorced   Spouse name: Not on file  . Number of children: 3  . Years of education: Not on file  . Highest education level: Not on file  Occupational History  . Occupation: [employer]  Social Needs  . Financial resource strain: Not on file  . Food insecurity:    Worry: Not on file    Inability: Not on file  . Transportation needs:    Medical: Not on file    Non-medical: Not on file  Tobacco Use  . Smoking status: Never Smoker  . Smokeless tobacco: Never Used  Substance and Sexual Activity  . Alcohol use: No  . Drug use: No  . Sexual activity: Not Currently    Birth control/protection: Post-menopausal  Lifestyle  . Physical activity:    Days per week: 5 days    Minutes per session: 50 min  . Stress: Not at all  Relationships  . Social connections:    Talks on phone: More than three times a week    Gets together: More than three times a week    Attends religious service: More than 4 times per year    Active member of club or organization: No    Attends meetings of clubs or organizations: Never    Relationship status: Divorced  . Intimate partner  violence:    Fear of current or ex partner: No    Emotionally abused: No    Physically abused: No    Forced sexual activity: No  Other Topics Concern  . Not on file  Social History Narrative  . Not on file    Allergies:  Allergies  Allergen Reactions  . Shellfish Allergy Anaphylaxis    Other reaction(s): anaphylaxis/angioedema Other reaction(s): anaphylaxis/angioedema  . Codeine Itching and Nausea Only    Other reaction(s): mild rash/itching  . Strawberry Extract Hives, Nausea Only and Nausea And Vomiting  . Hydrocodone Other (See Comments)    Hair falls out.  Marland Kitchen Hydrocodone-Acetaminophen     Other reaction(s): other/intolerance HAIR FALLS OUT   . Nsaids Other (See Comments) and Nausea And Vomiting    Gastric bleed Gastric bleed Gastric bleed  Gastric bleed   . Prednisone Other (See Comments)    Tremors  .  Singulair  [Montelukast Sodium]   . Singulair [Montelukast] Other (See Comments)    Syncope  . Sulfamethoxazole-Trimethoprim Other (See Comments)    Other reaction(s): Other (See Comments), other/intolerance MAKES PAIN WORSE MAKES PAIN WORSE  Increases pain Increases pain   . Tramadol Other (See Comments)    Unknown reaction.  . Celecoxib Other (See Comments)    GI bleed Other reaction(s): gi distress, Other (comments), Other (See Comments) GI BLEED GI bleeding  GI BLEED  GI Bleeding GI Bleeding   . Doxycycline Other (See Comments)    Unknown reaction. Other reaction(s): Other (See Comments), Unknown, Unknown (comments) Pt does not remember   . Eggs Or Egg-Derived Products Other (See Comments) and Rash    Unknown reaction.  . Gabapentin Other (See Comments)    Drowsiness. Other reaction(s): Other (See Comments), Unknown, Unknown (comments) Drowsy   . Ibuprofen Other (See Comments)    "Blood disorder", GI bleed. Other reaction(s): gi distress, Other (comments), Other (See Comments) GI bleeding GI BLEED  GI Bleeding GI BLEED GI Bleeding   . Latex Rash    Other reaction(s): mild rash/itching  . Naproxen Other (See Comments)    GI bleed Other reaction(s): Other (comments), Other (See Comments) GI Bleeding Gi bleeding GI BLEED  GI Bleeding  GI Bleeding   . Strawberry Flavor Hives    Medications: Prior to Admission medications   Medication Sig Start Date End Date Taking? Authorizing Provider  Cholecalciferol (VITAMIN D3) 50000 units CAPS TAKE ONE CAPSULE BY MOUTH ONE TIME PER WEEK 03/04/17  Yes [provider]  clobetasol ointment (TEMOVATE) 0.05 % clobetasol 0.05 % topical ointment   Yes [provider]  diclofenac sodium (VOLTAREN) 1 % GEL APPLY 2 G TOPICALLY 3 (THREE) TIMES DAILY AS NEEDED 12/06/16  Yes [provider]  Dietary Management Product (TREPADONE) CAPS Take by mouth.   Yes [provider]  ergocalciferol  (VITAMIN D2) 50000 units capsule Vitamin D2 50,000 unit capsule  TAKE ONE CAPSULE BY MOUTH EVERY 7 DAYS   Yes [provider]  estradiol (ESTRACE) 2 MG tablet Take 1 tablet (2 mg total) by mouth daily. 02/17/17  Yes Dalia Heading, CNM  Misc Natural Products (GLUCOSAMINE CHOND COMPLEX/MSM PO) Take by mouth.   Yes [provider]  SYMBICORT 80-4.5 MCG/ACT inhaler  03/09/17  Yes [provider]  acetaminophen (TYLENOL) 500 MG tablet Take by mouth.    [provider]    Physical Exam Vitals:BP 110/68 (BP Location: Left Arm, Patient Position: Sitting, Cuff Size: Normal)   Pulse 64   Ht 5'  1" (1.549 m)   Wt 162 lb (73.5 kg)   BMI 30.61 kg/m  No LMP recorded. Patient has had a hysterectomy.  Physical Exam  Constitutional: She is oriented to person, place, and time. She appears well-developed and well-nourished. No distress.  Cardiovascular: Normal rate.  Respiratory: Effort normal.    Chest: tenderness in right lateral IC spaces x3  Breast: Symmetrical, large breasts. No masses, skin changes, nipple changes or discharge. No axillary lymphadenopathy. No infraclavicular or supraclavicular lymphadenopathy.  Neurological: She is alert and oriented to person, place, and time.  Skin: Skin is warm and dry. No erythema.  Psychiatric: She has a normal mood and affect. Her behavior is normal.     Assessment: 57 y.o. G3P3003 with chest wall pain on the right  Plan: Recommend heat and Tylenol. Schedule mammogram for next month. Return to office if pain persists  Dalia Heading, [place]

## 2017-06-03 ENCOUNTER — Ambulatory Visit
Admission: RE | Admit: 2017-06-03 | Discharge: 2017-06-03 | Disposition: A | Discharge status: Home / Self Care | Payer: Medicare Other | Source: Ambulatory Visit | Attending: Certified Nurse Midwife | Admitting: Certified Nurse Midwife

## 2017-06-03 DIAGNOSIS — Z1239 Encounter for other screening for malignant neoplasm of breast: Secondary | ICD-10-CM

## 2017-06-03 DIAGNOSIS — Z1231 Encounter for screening mammogram for malignant neoplasm of breast: Secondary | ICD-10-CM | POA: Insufficient documentation

## 2017-06-14 ENCOUNTER — Inpatient Hospital Stay
Admission: RE | Admit: 2017-06-14 | Discharge: 2017-06-14 | Disposition: A | Discharge status: Home / Self Care | Payer: Self-pay | Source: Ambulatory Visit | Attending: *Deleted | Admitting: *Deleted

## 2017-06-14 ENCOUNTER — Other Ambulatory Visit: Payer: Self-pay | Admitting: *Deleted

## 2017-06-14 DIAGNOSIS — Z9289 Personal history of other medical treatment: Secondary | ICD-10-CM

## 2017-06-22 ENCOUNTER — Other Ambulatory Visit: Payer: Self-pay | Admitting: Orthopedic Surgery

## 2017-06-22 DIAGNOSIS — M25562 Pain in left knee: Secondary | ICD-10-CM

## 2017-06-26 DIAGNOSIS — K219 Gastro-esophageal reflux disease without esophagitis: Secondary | ICD-10-CM

## 2017-06-26 DIAGNOSIS — M47812 Spondylosis without myelopathy or radiculopathy, cervical region: Secondary | ICD-10-CM | POA: Insufficient documentation

## 2017-06-26 DIAGNOSIS — M531 Cervicobrachial syndrome: Secondary | ICD-10-CM | POA: Insufficient documentation

## 2017-06-26 HISTORY — DX: Gastro-esophageal reflux disease without esophagitis: K21.9

## 2017-07-04 ENCOUNTER — Ambulatory Visit: Payer: Medicare Other

## 2017-07-12 ENCOUNTER — Ambulatory Visit
Admission: RE | Admit: 2017-07-12 | Discharge: 2017-07-12 | Disposition: A | Discharge status: Home / Self Care | Payer: Medicare Other | Source: Ambulatory Visit | Attending: Orthopedic Surgery | Admitting: Orthopedic Surgery

## 2017-07-12 DIAGNOSIS — M25562 Pain in left knee: Secondary | ICD-10-CM | POA: Insufficient documentation

## 2017-07-12 DIAGNOSIS — M949 Disorder of cartilage, unspecified: Secondary | ICD-10-CM | POA: Insufficient documentation

## 2017-10-05 ENCOUNTER — Other Ambulatory Visit: Payer: Self-pay | Admitting: Student

## 2017-10-05 DIAGNOSIS — R1013 Epigastric pain: Secondary | ICD-10-CM

## 2017-10-05 DIAGNOSIS — R1011 Right upper quadrant pain: Secondary | ICD-10-CM

## 2017-10-18 DIAGNOSIS — G894 Chronic pain syndrome: Secondary | ICD-10-CM | POA: Insufficient documentation

## 2017-10-18 DIAGNOSIS — E782 Mixed hyperlipidemia: Secondary | ICD-10-CM | POA: Insufficient documentation

## 2017-10-18 DIAGNOSIS — J302 Other seasonal allergic rhinitis: Secondary | ICD-10-CM | POA: Insufficient documentation

## 2017-10-18 DIAGNOSIS — F411 Generalized anxiety disorder: Secondary | ICD-10-CM

## 2017-10-18 DIAGNOSIS — M1710 Unilateral primary osteoarthritis, unspecified knee: Secondary | ICD-10-CM | POA: Insufficient documentation

## 2017-10-18 DIAGNOSIS — J45909 Unspecified asthma, uncomplicated: Secondary | ICD-10-CM | POA: Insufficient documentation

## 2017-10-18 HISTORY — DX: Generalized anxiety disorder: F41.1

## 2017-10-25 ENCOUNTER — Ambulatory Visit: Payer: Medicare Other

## 2017-11-02 ENCOUNTER — Other Ambulatory Visit: Payer: Self-pay | Admitting: Family

## 2017-11-02 ENCOUNTER — Ambulatory Visit
Admission: RE | Admit: 2017-11-02 | Discharge: 2017-11-02 | Disposition: A | Discharge status: Home / Self Care | Payer: Medicare Other | Source: Ambulatory Visit | Attending: Family | Admitting: Family

## 2017-11-02 DIAGNOSIS — J189 Pneumonia, unspecified organism: Secondary | ICD-10-CM

## 2017-11-02 DIAGNOSIS — R911 Solitary pulmonary nodule: Secondary | ICD-10-CM | POA: Insufficient documentation

## 2017-11-07 ENCOUNTER — Other Ambulatory Visit: Payer: Self-pay | Admitting: Family

## 2017-11-07 DIAGNOSIS — R911 Solitary pulmonary nodule: Secondary | ICD-10-CM

## 2017-11-11 ENCOUNTER — Ambulatory Visit: Payer: Medicaid Other

## 2017-11-11 ENCOUNTER — Ambulatory Visit
Admission: RE | Admit: 2017-11-11 | Discharge: 2017-11-11 | Disposition: A | Discharge status: Home / Self Care | Payer: Medicare Other | Source: Ambulatory Visit | Attending: Family | Admitting: Family

## 2017-11-11 DIAGNOSIS — R911 Solitary pulmonary nodule: Secondary | ICD-10-CM | POA: Insufficient documentation

## 2017-11-15 LAB — PULMONARY FUNCTION TEST

## 2017-11-18 ENCOUNTER — Other Ambulatory Visit: Payer: Self-pay | Admitting: Specialist

## 2017-11-18 DIAGNOSIS — R918 Other nonspecific abnormal finding of lung field: Secondary | ICD-10-CM | POA: Insufficient documentation

## 2017-11-18 DIAGNOSIS — I10 Essential (primary) hypertension: Secondary | ICD-10-CM | POA: Insufficient documentation

## 2017-11-18 DIAGNOSIS — R0609 Other forms of dyspnea: Principal | ICD-10-CM

## 2017-11-18 HISTORY — DX: Other nonspecific abnormal finding of lung field: R91.8

## 2017-11-22 ENCOUNTER — Encounter
Admission: RE | Admit: 2017-11-22 | Discharge: 2017-11-22 | Disposition: A | Discharge status: Home / Self Care | Payer: Medicare Other | Source: Ambulatory Visit | Attending: Specialist | Admitting: Specialist

## 2017-11-22 DIAGNOSIS — R0609 Other forms of dyspnea: Secondary | ICD-10-CM | POA: Diagnosis present

## 2017-11-22 LAB — GLUCOSE, CAPILLARY: GLUCOSE-CAPILLARY: 69 mg/dL — AB (ref 70–99)

## 2017-11-22 MED ORDER — FLUDEOXYGLUCOSE F - 18 (FDG) INJECTION
8.4000 | Freq: Once | INTRAVENOUS | Status: AC | PRN
  Administered 2017-11-22: 8.56 via INTRAVENOUS

## 2017-12-04 DIAGNOSIS — R0600 Dyspnea, unspecified: Secondary | ICD-10-CM

## 2017-12-04 HISTORY — DX: Dyspnea, unspecified: R06.00

## 2017-12-05 ENCOUNTER — Ambulatory Visit (INDEPENDENT_AMBULATORY_CARE_PROVIDER_SITE_OTHER): Payer: Medicare Other | Admitting: Cardiothoracic Surgery

## 2017-12-05 ENCOUNTER — Encounter: Payer: Self-pay | Admitting: Cardiothoracic Surgery

## 2017-12-05 ENCOUNTER — Telehealth: Payer: Self-pay | Admitting: *Deleted

## 2017-12-05 ENCOUNTER — Other Ambulatory Visit: Payer: Self-pay

## 2017-12-05 VITALS — BP 142/88 | HR 80 | Temp 97.2°F | Resp 18 | Ht 65.0 in | Wt 159.4 lb

## 2017-12-05 DIAGNOSIS — R918 Other nonspecific abnormal finding of lung field: Secondary | ICD-10-CM

## 2017-12-05 DIAGNOSIS — M5136 Other intervertebral disc degeneration, lumbar region: Secondary | ICD-10-CM | POA: Insufficient documentation

## 2017-12-05 NOTE — Telephone Encounter (Signed)
Patient called the office back and was notified of appointment with Dr. Mortimer Fries for tomorrow. She verbalizes understanding.   The patient reports that on 10-18-17 she was given erythromycin 250 mg. Then on 10-21-17 she stopped erythromycin and started amoxicillin 875-125 mg. Patient was then given levofloxacin 750 mg for 7 days on 10-24-17. This has been forwarded to Dr. Genevive Bi to inform him of antibiotics she had taken.

## 2017-12-05 NOTE — Telephone Encounter (Signed)
Message left for patient to call the office.   Patient has been scheduled for an appointment with Dr. Mortimer Fries for 12-06-17 arrive at 9:45 am. We need to inform patient of this.

## 2017-12-05 NOTE — Progress Notes (Signed)
Patient ID: Carmen Grimes, female   DOB: January 01, 1961, 57 y.o.   MRN: [id number]  Chief Complaint  Patient presents with  . New Patient (Initial Visit)    LUL mass    Referred By Dr. Wallene Huh Reason for Referral lingular mass  HPI Location, Quality, Duration, Severity, Timing, Context, Modifying Factors, Associated Signs and Symptoms.  Carmen Grimes is a 57 y.o. female.  Her problems began in October of this year when she began experiencing increasing fatigue which she described as some sinusitis and lethargy.  She went to her primary care physician and was diagnosed with sinusitis and begun on antibiotics.  She did not improve and began experiencing a lobe grade fever with some cough and a subsequent course of antibiotics was switched to a different round.  She had a chest x-ray made in October which revealed a possible lingular nodule and this was followed by a CT scan and PET scan over the next 2 weeks.  This revealed a 2.5 cm lingular nodule which was consistent with either infection or pneumonia.  The patient states that she has felt a little bit better and was seen by Dr. Raul Del for evaluation of this nodule.  It was still unclear whether or not she should undergo biopsy or surveillance and the patient was sent to me for my evaluation.  She did have a set of pulmonary function studies done that were normal.  She has a 18-pack-year smoking history but quit back in 1985.  She has no other risk factors for lung cancer.  She has no family history of lung cancer.  She has no exposure to asbestos.  She has gained some weight over the last several weeks and has an excellent appetite.  She is unable to walk up a flight of stairs quickly because of deconditioning and shortness of breath.  She also states that she has problems with her knees and is seeing Dr. Marry Guan in the [place] [facility] for possible knee replacement.   Past Medical History:  Diagnosis Date  . Asthma   . GERD (gastroesophageal  reflux disease)   . H/O: GI bleed   . Osteoarthritis     Past Surgical History:  Procedure Laterality Date  . ABDOMINAL HYSTERECTOMY    . ANTERIOR FUSION CERVICAL SPINE  2009  . COLONOSCOPY  2013   normal  . ESOPHAGOGASTRODUODENOSCOPY     GI bleed  . KNEE ARTHROSCOPY Right    x3  . KNEE SURGERY Left    Arthroscopy x1  . LAPAROSCOPIC SUPRACERVICAL HYSTERECTOMY  2002   with BSO for fibroids/ AUB/ pelvic pain  . LAPAROSCOPIC TUBAL LIGATION  1982  . SHOULDER SURGERY Right    rotator cuff repair  . SHOULDER SURGERY Right    second rotator cuff repair  . tubal reanastamosis  1985    Family History  Problem Relation Age of Onset  . Diabetes Mother   . Dementia Mother        Alzheimers  . Hypertension Mother   . Glaucoma Mother   . Prostate cancer Father 25  . Stroke Father   . Arthritis Father   . Osteoporosis Father   . Prostate cancer Brother   . Breast cancer Neg Hx     Social History Social History   Tobacco Use  . Smoking status: Never Smoker  . Smokeless tobacco: Never Used  Substance Use Topics  . Alcohol use: No  . Drug use: No    Allergies  Allergen Reactions  .  Shellfish Allergy Anaphylaxis    Other reaction(s): anaphylaxis/angioedema Other reaction(s): anaphylaxis/angioedema  . Ascorbic Acid Hives and Nausea And Vomiting    HIVES / VOMITING   . Codeine Itching and Nausea Only    Other reaction(s): mild rash/itching  . Strawberry Extract Hives, Nausea Only and Nausea And Vomiting  . Hydrocodone Other (See Comments)    Hair falls out.  Marland Kitchen Hydrocodone-Acetaminophen     Other reaction(s): other/intolerance HAIR FALLS OUT   . Prednisone Other (See Comments)    Tremors  . Singulair  [Montelukast Sodium]   . Singulair [Montelukast] Other (See Comments)    Syncope  . Sulfamethoxazole-Trimethoprim Other (See Comments)    Other reaction(s): Other (See Comments), other/intolerance MAKES PAIN WORSE MAKES PAIN WORSE  Increases pain Increases  pain   . Tramadol Other (See Comments)    Unknown reaction.  . Celecoxib Other (See Comments)    GI bleed Other reaction(s): gi distress, Other (comments), Other (See Comments) GI BLEED GI bleeding  GI BLEED  GI Bleeding GI Bleeding   . Doxycycline Other (See Comments)    Unknown reaction. Other reaction(s): Other (See Comments), Unknown, Unknown (comments) Pt does not remember   . Eggs Or Egg-Derived Products Other (See Comments) and Rash    Unknown reaction.  . Gabapentin Other (See Comments)    Drowsiness. Other reaction(s): Other (See Comments), Unknown, Unknown (comments) Drowsy   . Ibuprofen Other (See Comments)    "Blood disorder", GI bleed. Other reaction(s): gi distress, Other (comments), Other (See Comments) GI bleeding GI BLEED  GI Bleeding GI BLEED GI Bleeding   . Latex Rash and Itching    Other reaction(s): mild rash/itching  . Naproxen Other (See Comments)    GI bleed Other reaction(s): Other (comments), Other (See Comments) GI Bleeding Gi bleeding GI BLEED  GI Bleeding  GI Bleeding   . Nsaids Other (See Comments) and Nausea And Vomiting    Gastric bleed Gastric bleed Gastric bleed  Gastric bleed  Gastric bleed Gastric bleed   . Strawberry Flavor Hives    Current Outpatient Medications  Medication Sig Dispense Refill  . Biotin 10 MG CAPS Take by mouth.    . Cholecalciferol (VITAMIN D3) 50000 units CAPS TAKE ONE CAPSULE BY MOUTH ONE TIME PER WEEK  1  . diclofenac sodium (VOLTAREN) 1 % GEL APPLY 2 G TOPICALLY 3 (THREE) TIMES DAILY AS NEEDED  0  . estradiol (ESTRACE) 2 MG tablet Take 1 tablet (2 mg total) by mouth daily. 90 tablet 3  . Estradiol (VAGIFEM) 10 MCG TABS vaginal tablet Vagifem 10 mcg vaginal tablet    . Misc Natural Products (GLUCOSAMINE CHOND COMPLEX/MSM PO) Take by mouth.    . SYMBICORT 80-4.5 MCG/ACT inhaler     . VENTOLIN HFA 108 (90 Base) MCG/ACT inhaler TAKE 1 TABLET BY MOUTH EVERY 4 TO 6 HOURS AS NEEDED  6   No  current facility-administered medications for this visit.       Review of Systems A complete review of systems was asked and was negative except for the following positive findings joint pain, lethargy, shortness of breath  Blood pressure (!) 142/88, pulse 80, temperature (!) 97.2 F (36.2 C), temperature source Temporal, resp. rate 18, height 5\' 5"  (1.651 m), weight 159 lb 6.4 oz (72.3 kg), SpO2 98 %.  Physical Exam CONSTITUTIONAL:  Pleasant, well-developed, well-nourished, and in no acute distress. EYES: Pupils equal and reactive to light, Sclera non-icteric EARS, NOSE, MOUTH AND THROAT:  The oropharynx was clear.  Dentition is good repair with dentures on the top.  Oral mucosa pink and moist. LYMPH NODES:  Lymph nodes in the neck and axillae were normal RESPIRATORY:  Lungs were clear.  Normal respiratory effort without pathologic use of accessory muscles of respiration CARDIOVASCULAR: Heart was regular without murmurs.  There were no carotid bruits. GI: The abdomen was soft, nontender, and nondistended. There were no palpable masses. There was no hepatosplenomegaly. There were normal bowel sounds in all quadrants. GU:  Rectal deferred.   MUSCULOSKELETAL:  Normal muscle strength and tone.  No clubbing or cyanosis.   SKIN:  There were no pathologic skin lesions.  There were no nodules on palpation. NEUROLOGIC:  Sensation is normal.  Cranial nerves are grossly intact. PSYCH:  Oriented to person, place and time.  Mood and affect are normal.  Data Reviewed Chest x-ray, CT scan and PET scan  I have personally reviewed the patient's imaging, laboratory findings and medical records.    Assessment    2.5 cm lingular mass    Plan    I reviewed with her the results of the CT scan and PET scan.  We went over the x-rays.  Certainly the lesion could be either malignancy or pneumonia.  I discussed with her the options.  I do not think direct surgical intervention would be required at this  point.  However I did offer her that.  I explained her that I thought the most logical step would be to either repeat the CT scan in 6 to 8 weeks or perform a navigational bronchoscopy.  After extensive discussion with the patient regarding the various options she would like to pursue that.  We will go ahead and set her up to have that performed.  I will see her back one week after the biopsy.      Nestor Lewandowsky, MD 12/05/2017, 8:54 AM

## 2017-12-05 NOTE — Patient Instructions (Signed)
We have sent the referral to [facility] for the navigational bronchoscopy.  If  do not hear from anyone within 5-7 days please call our office.   Please call our office after you have the ENB done to discuss the results of the exam.

## 2017-12-06 ENCOUNTER — Ambulatory Visit (INDEPENDENT_AMBULATORY_CARE_PROVIDER_SITE_OTHER): Payer: Medicare Other | Admitting: Internal Medicine

## 2017-12-06 ENCOUNTER — Encounter: Payer: Self-pay | Admitting: Internal Medicine

## 2017-12-06 VITALS — BP 110/64 | HR 77 | Resp 16 | Ht 65.0 in | Wt 160.0 lb

## 2017-12-06 DIAGNOSIS — R918 Other nonspecific abnormal finding of lung field: Secondary | ICD-10-CM | POA: Diagnosis not present

## 2017-12-06 DIAGNOSIS — R9389 Abnormal findings on diagnostic imaging of other specified body structures: Secondary | ICD-10-CM | POA: Diagnosis not present

## 2017-12-06 NOTE — H&P (View-Only) (Signed)
Name: Carmen Grimes MRN: [id number] DOB: [birth year]     CONSULTATION DATE: 12.3.19 REFERRING MD : Dr. Faith Rogue  CHIEF COMPLAINT: Abnormal CT chest  STUDIES:    11/14/2017   CT chest Independently reviewed by Me today Interpretation abnormal CT chest with left lower lobe nodular opacification Likely mucous plugging pneumonia less likely cancer  HISTORY OF PRESENT ILLNESS: 57 year old pleasant African-American female seen today for abnormal CT chest In the beginning of October patient had signs and symptoms of productive cough mostly at night with yellow-tinged sputum associated with chills and sweats She had some shortness of breath associated with right-sided facial pain and headaches Also associated with generalized weakness and decreased energy  Patient was first given azithromycin, then was given Augmentin, and was given levofloxacin for 7 days Patient does not understand why she was given 3 different antibiotics  Patient subsequently ended up getting a chest x-ray and a CT chest CT chest showed left lower lobe opacification looks to be expanding over several images and subsequently had a PET scan which showed increased uptake and increased in size  Patient was then sent to Dr. Genevive Bi for further evaluation Patient was then sent to me for electromagnetic navigational bronchoscopy evaluation   At this time patient symptoms have subsided and the only residual symptoms is weakness and decreased energy Patient denies cough She denies hemoptysis  Patient does have a history of asthma diagnosed in 1991 Patient takes Symbicort really uses albuterol as needed While she was going through her illness in October she was using her albuterol every other day  Patient states that she was not given prednisone due to the fact that it causes confusion and severe anxiety  Patient's asthma seems to be well-controlled on daily Symbicort She is allergic to cats and dogs which she has at  home  After further evaluation discussion with the patient she would feel more comfortable undergoing navigational bronchoscopy at this time however I do feel that this is an infectious and inflammatory process   The Risks and Benefits of the Bronchoscopy procedure with ENB were explained to patient  I have discussed the risk for Acute Bleeding, increased chance of Infection, increased chance of Respiratory Failure and Cardiac Arrest, increased chance of pneumothorax and collapsed lung, as well as increased Stroke and Death.  I have also explained to avoid all types of NSAIDs 1 week prior to procedure date  to decrease chance of bleeding, and also to avoid food and drinks the midnight prior to procedure.  The procedure consists of a video camera with a light source to be placed and inserted  into the lungs to  look for abnormal tissue and to obtain tissue samples by using needle and biopsy tools.  The patient/family understand the risks and benefits and have agreed to proceed with procedure.     PAST MEDICAL HISTORY :   has a past medical history of Asthma, GERD (gastroesophageal reflux disease), H/O: GI bleed, and Osteoarthritis.  has a past surgical history that includes Knee arthroscopy (Right); Shoulder surgery (Right); Knee surgery (Left); Shoulder surgery (Right); Laparoscopic tubal ligation (1982); tubal reanastamosis (1985); Laparoscopic supracervical hysterectomy (2002); Anterior fusion cervical spine (2009); Esophagogastroduodenoscopy; Colonoscopy (2013); and Abdominal hysterectomy. Prior to Admission medications   Medication Sig Start Date End Date Taking? Authorizing Provider  Biotin 10 MG CAPS Take by mouth.   Yes [provider]  Cholecalciferol (VITAMIN D3) 50000 units CAPS TAKE ONE CAPSULE BY MOUTH ONE TIME PER WEEK 03/04/17  Yes [provider]  diclofenac sodium (VOLTAREN) 1 % GEL APPLY 2 G TOPICALLY 3 (THREE) TIMES DAILY AS NEEDED 12/06/16  Yes [provider]  estradiol (ESTRACE) 2 MG tablet Take 1 tablet (2 mg total) by mouth daily. 02/17/17  Yes Dalia Heading, CNM  Estradiol (VAGIFEM) 10 MCG TABS vaginal tablet Vagifem 10 mcg vaginal tablet   Yes [provider]  Misc Natural Products (GLUCOSAMINE CHOND COMPLEX/MSM PO) Take by mouth.   Yes [provider]  SYMBICORT 80-4.5 MCG/ACT inhaler  03/09/17  Yes [provider]  VENTOLIN HFA 108 (90 Base) MCG/ACT inhaler TAKE 1 TABLET BY MOUTH EVERY 4 TO 6 HOURS AS NEEDED 11/14/17  Yes [provider]   Allergies  Allergen Reactions  . Shellfish Allergy Anaphylaxis    Other reaction(s): anaphylaxis/angioedema Other reaction(s): anaphylaxis/angioedema  . Ascorbic Acid Hives and Nausea And Vomiting    HIVES / VOMITING   . Codeine Itching and Nausea Only    Other reaction(s): mild rash/itching  . Strawberry Extract Hives, Nausea Only and Nausea And Vomiting  . Hydrocodone Other (See Comments)    Hair falls out.  Marland Kitchen Hydrocodone-Acetaminophen     Other reaction(s): other/intolerance HAIR FALLS OUT   . Prednisone Other (See Comments)    Tremors  . Singulair  [Montelukast Sodium]   . Singulair [Montelukast] Other (See Comments)    Syncope  . Sulfamethoxazole-Trimethoprim Other (See Comments)    Other reaction(s): Other (See Comments), other/intolerance MAKES PAIN WORSE MAKES PAIN WORSE  Increases pain Increases pain   . Tramadol Other (See Comments)    Unknown reaction.  . Celecoxib Other (See Comments)    GI bleed Other reaction(s): gi distress, Other (comments), Other (See Comments) GI BLEED GI bleeding  GI BLEED  GI Bleeding GI Bleeding   . Doxycycline Other (See Comments)    Unknown reaction. Other reaction(s): Other (See Comments), Unknown, Unknown (comments) Pt does not remember   . Eggs Or Egg-Derived Products Other (See Comments) and Rash    Unknown reaction.  . Gabapentin Other (See Comments)    Drowsiness. Other  reaction(s): Other (See Comments), Unknown, Unknown (comments) Drowsy   . Ibuprofen Other (See Comments)    "Blood disorder", GI bleed. Other reaction(s): gi distress, Other (comments), Other (See Comments) GI bleeding GI BLEED  GI Bleeding GI BLEED GI Bleeding   . Latex Rash and Itching    Other reaction(s): mild rash/itching  . Naproxen Other (See Comments)    GI bleed Other reaction(s): Other (comments), Other (See Comments) GI Bleeding Gi bleeding GI BLEED  GI Bleeding  GI Bleeding   . Nsaids Other (See Comments) and Nausea And Vomiting    Gastric bleed Gastric bleed Gastric bleed  Gastric bleed  Gastric bleed Gastric bleed   . Strawberry Flavor Hives    FAMILY HISTORY:  family history includes Arthritis in her father; Dementia in her mother; Diabetes in her mother; Glaucoma in her mother; Hypertension in her mother; Osteoporosis in her father; Prostate cancer in her brother; Prostate cancer (age of onset: 9) in her father; Stroke in her father. SOCIAL HISTORY:  reports that she has never smoked. She has never used smokeless tobacco. She reports that she does not drink alcohol or use drugs.  REVIEW OF SYSTEMS:   Constitutional: Negative for fever, chills, weight loss, +malaise/fatigue  HENT: Negative for hearing loss, ear pain, nosebleeds, congestion, sore throat, neck pain, tinnitus and ear discharge.   Eyes: Negative for blurred vision, double vision, photophobia, pain, discharge  and redness.  Respiratory: Negative for cough, hemoptysis, sputum production, shortness of breath, wheezing and stridor.   Cardiovascular: Negative for chest pain, palpitations, orthopnea, claudication, leg swelling and PND.  Gastrointestinal: Negative for heartburn, nausea, vomiting, abdominal pain, diarrhea, constipation, blood in stool and melena.  Genitourinary: Negative for dysuria, urgency, frequency, hematuria and flank pain.  Musculoskeletal: Negative for myalgias, back  pain, joint pain and falls.  Skin: Negative for itching and rash.  Neurological: Negative for dizziness, tingling, tremors, sensory change, speech change, focal weakness, seizures, loss of consciousness, weakness and headaches.  Endo/Heme/Allergies: Negative for environmental allergies and polydipsia. Does not bruise/bleed easily.  ALL OTHER ROS ARE NEGATIVE   BP 110/64 (BP Location: Left Arm, Cuff Size: Normal)   Pulse 77   Resp 16   Ht 5\' 5"  (1.651 m)   Wt 160 lb (72.6 kg)   SpO2 97%   BMI 26.63 kg/m    Physical Examination:   GENERAL:NAD, no fevers, chills, no weakness no fatigue HEAD: Normocephalic, atraumatic.  EYES: Pupils equal, round, reactive to light. Extraocular muscles intact. No scleral icterus.  MOUTH: Moist mucosal membrane.   EAR, NOSE, THROAT: Clear without exudates. No external lesions.  NECK: Supple. No thyromegaly. No nodules. No JVD.  PULMONARY:CTA B/L no wheezes, no crackles, no rhonchi CARDIOVASCULAR: S1 and S2. Regular rate and rhythm. No murmurs, rubs, or gallops. No edema.  GASTROINTESTINAL: Soft, nontender, nondistended. No masses. Positive bowel sounds.  MUSCULOSKELETAL: No swelling, clubbing, or edema. Range of motion full in all extremities.  NEUROLOGIC: Cranial nerves II through XII are intact. No gross focal neurological deficits.  SKIN: No ulceration, lesions, rashes, or cyanosis. Skin warm and dry. Turgor intact.  PSYCHIATRIC: Mood, affect within normal limits. The patient is awake, alert and oriented x 3. Insight, judgment intact.      ASSESSMENT / PLAN: 57 year old pleasant African-American female seen today for abnormal CT chest and based on her signs and symptoms of productive cough yellow-tinged sputum with sweats and chills most likely related to asthma exacerbation with left-sided pneumonia with mucus plugging, however malignancy is a possibility but is a low likelihood at this time.  Patient would like to proceed with ENB for further  evaluation and diagnostic evaluation I have suggested that patient would undergo a super D CT chest prior to procedure  At this time I do not recommend additional antibiotics at this time as she is feeling somewhat better Her asthma seems to be well-controlled at this time with her current inhaler regimen  Plan for ENB in the next several weeks    Patient  satisfied with Plan of action and management. All questions answered  Corrin Parker, M.D.  [facility]  Medical Director [place] Director [facility] Cardio-Pulmonary Department

## 2017-12-06 NOTE — Progress Notes (Signed)
Name: Carmen Grimes MRN: [id number] DOB: 23-Mar-1960     CONSULTATION DATE: 12.3.19 REFERRING MD : Dr. Faith Rogue  CHIEF COMPLAINT: Abnormal CT chest  STUDIES:    11/14/2017   CT chest Independently reviewed by Me today Interpretation abnormal CT chest with left lower lobe nodular opacification Likely mucous plugging pneumonia less likely cancer  HISTORY OF PRESENT ILLNESS: 57 year old pleasant African-American female seen today for abnormal CT chest In the beginning of October patient had signs and symptoms of productive cough mostly at night with yellow-tinged sputum associated with chills and sweats She had some shortness of breath associated with right-sided facial pain and headaches Also associated with generalized weakness and decreased energy  Patient was first given azithromycin, then was given Augmentin, and was given levofloxacin for 7 days Patient does not understand why she was given 3 different antibiotics  Patient subsequently ended up getting a chest x-ray and a CT chest CT chest showed left lower lobe opacification looks to be expanding over several images and subsequently had a PET scan which showed increased uptake and increased in size  Patient was then sent to Dr. Genevive Bi for further evaluation Patient was then sent to me for electromagnetic navigational bronchoscopy evaluation   At this time patient symptoms have subsided and the only residual symptoms is weakness and decreased energy Patient denies cough She denies hemoptysis  Patient does have a history of asthma diagnosed in 1991 Patient takes Symbicort really uses albuterol as needed While she was going through her illness in October she was using her albuterol every other day  Patient states that she was not given prednisone due to the fact that it causes confusion and severe anxiety  Patient's asthma seems to be well-controlled on daily Symbicort She is allergic to cats and dogs which she has at  home  After further evaluation discussion with the patient she would feel more comfortable undergoing navigational bronchoscopy at this time however I do feel that this is an infectious and inflammatory process   The Risks and Benefits of the Bronchoscopy procedure with ENB were explained to patient  I have discussed the risk for Acute Bleeding, increased chance of Infection, increased chance of Respiratory Failure and Cardiac Arrest, increased chance of pneumothorax and collapsed lung, as well as increased Stroke and Death.  I have also explained to avoid all types of NSAIDs 1 week prior to procedure date  to decrease chance of bleeding, and also to avoid food and drinks the midnight prior to procedure.  The procedure consists of a video camera with a light source to be placed and inserted  into the lungs to  look for abnormal tissue and to obtain tissue samples by using needle and biopsy tools.  The patient/family understand the risks and benefits and have agreed to proceed with procedure.     PAST MEDICAL HISTORY :   has a past medical history of Asthma, GERD (gastroesophageal reflux disease), H/O: GI bleed, and Osteoarthritis.  has a past surgical history that includes Knee arthroscopy (Right); Shoulder surgery (Right); Knee surgery (Left); Shoulder surgery (Right); Laparoscopic tubal ligation (1982); tubal reanastamosis (1985); Laparoscopic supracervical hysterectomy (2002); Anterior fusion cervical spine (2009); Esophagogastroduodenoscopy; Colonoscopy (2013); and Abdominal hysterectomy. Prior to Admission medications   Medication Sig Start Date End Date Taking? Authorizing Provider  Biotin 10 MG CAPS Take by mouth.   Yes [provider]  Cholecalciferol (VITAMIN D3) 50000 units CAPS TAKE ONE CAPSULE BY MOUTH ONE TIME PER WEEK 03/04/17  Yes [provider]  diclofenac sodium (VOLTAREN) 1 % GEL APPLY 2 G TOPICALLY 3 (THREE) TIMES DAILY AS NEEDED 12/06/16  Yes [provider]  estradiol (ESTRACE) 2 MG tablet Take 1 tablet (2 mg total) by mouth daily. 02/17/17  Yes Dalia Heading, CNM  Estradiol (VAGIFEM) 10 MCG TABS vaginal tablet Vagifem 10 mcg vaginal tablet   Yes [provider]  Misc Natural Products (GLUCOSAMINE CHOND COMPLEX/MSM PO) Take by mouth.   Yes [provider]  SYMBICORT 80-4.5 MCG/ACT inhaler  03/09/17  Yes [provider]  VENTOLIN HFA 108 (90 Base) MCG/ACT inhaler TAKE 1 TABLET BY MOUTH EVERY 4 TO 6 HOURS AS NEEDED 11/14/17  Yes [provider]   Allergies  Allergen Reactions  . Shellfish Allergy Anaphylaxis    Other reaction(s): anaphylaxis/angioedema Other reaction(s): anaphylaxis/angioedema  . Ascorbic Acid Hives and Nausea And Vomiting    HIVES / VOMITING   . Codeine Itching and Nausea Only    Other reaction(s): mild rash/itching  . Strawberry Extract Hives, Nausea Only and Nausea And Vomiting  . Hydrocodone Other (See Comments)    Hair falls out.  Marland Kitchen Hydrocodone-Acetaminophen     Other reaction(s): other/intolerance HAIR FALLS OUT   . Prednisone Other (See Comments)    Tremors  . Singulair  [Montelukast Sodium]   . Singulair [Montelukast] Other (See Comments)    Syncope  . Sulfamethoxazole-Trimethoprim Other (See Comments)    Other reaction(s): Other (See Comments), other/intolerance MAKES PAIN WORSE MAKES PAIN WORSE  Increases pain Increases pain   . Tramadol Other (See Comments)    Unknown reaction.  . Celecoxib Other (See Comments)    GI bleed Other reaction(s): gi distress, Other (comments), Other (See Comments) GI BLEED GI bleeding  GI BLEED  GI Bleeding GI Bleeding   . Doxycycline Other (See Comments)    Unknown reaction. Other reaction(s): Other (See Comments), Unknown, Unknown (comments) Pt does not remember   . Eggs Or Egg-Derived Products Other (See Comments) and Rash    Unknown reaction.  . Gabapentin Other (See Comments)    Drowsiness. Other  reaction(s): Other (See Comments), Unknown, Unknown (comments) Drowsy   . Ibuprofen Other (See Comments)    "Blood disorder", GI bleed. Other reaction(s): gi distress, Other (comments), Other (See Comments) GI bleeding GI BLEED  GI Bleeding GI BLEED GI Bleeding   . Latex Rash and Itching    Other reaction(s): mild rash/itching  . Naproxen Other (See Comments)    GI bleed Other reaction(s): Other (comments), Other (See Comments) GI Bleeding Gi bleeding GI BLEED  GI Bleeding  GI Bleeding   . Nsaids Other (See Comments) and Nausea And Vomiting    Gastric bleed Gastric bleed Gastric bleed  Gastric bleed  Gastric bleed Gastric bleed   . Strawberry Flavor Hives    FAMILY HISTORY:  family history includes Arthritis in her father; Dementia in her mother; Diabetes in her mother; Glaucoma in her mother; Hypertension in her mother; Osteoporosis in her father; Prostate cancer in her brother; Prostate cancer (age of onset: 34) in her father; Stroke in her father. SOCIAL HISTORY:  reports that she has never smoked. She has never used smokeless tobacco. She reports that she does not drink alcohol or use drugs.  REVIEW OF SYSTEMS:   Constitutional: Negative for fever, chills, weight loss, +malaise/fatigue  HENT: Negative for hearing loss, ear pain, nosebleeds, congestion, sore throat, neck pain, tinnitus and ear discharge.   Eyes: Negative for blurred vision, double vision, photophobia, pain, discharge  and redness.  Respiratory: Negative for cough, hemoptysis, sputum production, shortness of breath, wheezing and stridor.   Cardiovascular: Negative for chest pain, palpitations, orthopnea, claudication, leg swelling and PND.  Gastrointestinal: Negative for heartburn, nausea, vomiting, abdominal pain, diarrhea, constipation, blood in stool and melena.  Genitourinary: Negative for dysuria, urgency, frequency, hematuria and flank pain.  Musculoskeletal: Negative for myalgias, back  pain, joint pain and falls.  Skin: Negative for itching and rash.  Neurological: Negative for dizziness, tingling, tremors, sensory change, speech change, focal weakness, seizures, loss of consciousness, weakness and headaches.  Endo/Heme/Allergies: Negative for environmental allergies and polydipsia. Does not bruise/bleed easily.  ALL OTHER ROS ARE NEGATIVE   BP 110/64 (BP Location: Left Arm, Cuff Size: Normal)   Pulse 77   Resp 16   Ht 5\' 5"  (1.651 m)   Wt 160 lb (72.6 kg)   SpO2 97%   BMI 26.63 kg/m    Physical Examination:   GENERAL:NAD, no fevers, chills, no weakness no fatigue HEAD: Normocephalic, atraumatic.  EYES: Pupils equal, round, reactive to light. Extraocular muscles intact. No scleral icterus.  MOUTH: Moist mucosal membrane.   EAR, NOSE, THROAT: Clear without exudates. No external lesions.  NECK: Supple. No thyromegaly. No nodules. No JVD.  PULMONARY:CTA B/L no wheezes, no crackles, no rhonchi CARDIOVASCULAR: S1 and S2. Regular rate and rhythm. No murmurs, rubs, or gallops. No edema.  GASTROINTESTINAL: Soft, nontender, nondistended. No masses. Positive bowel sounds.  MUSCULOSKELETAL: No swelling, clubbing, or edema. Range of motion full in all extremities.  NEUROLOGIC: Cranial nerves II through XII are intact. No gross focal neurological deficits.  SKIN: No ulceration, lesions, rashes, or cyanosis. Skin warm and dry. Turgor intact.  PSYCHIATRIC: Mood, affect within normal limits. The patient is awake, alert and oriented x 3. Insight, judgment intact.      ASSESSMENT / PLAN: 57 year old pleasant African-American female seen today for abnormal CT chest and based on her signs and symptoms of productive cough yellow-tinged sputum with sweats and chills most likely related to asthma exacerbation with left-sided pneumonia with mucus plugging, however malignancy is a possibility but is a low likelihood at this time.  Patient would like to proceed with ENB for further  evaluation and diagnostic evaluation I have suggested that patient would undergo a super D CT chest prior to procedure  At this time I do not recommend additional antibiotics at this time as she is feeling somewhat better Her asthma seems to be well-controlled at this time with her current inhaler regimen  Plan for ENB in the next several weeks    Patient  satisfied with Plan of action and management. All questions answered  Corrin Parker, M.D.  [facility]  Medical Director [place] Director [facility] Cardio-Pulmonary Department

## 2017-12-06 NOTE — Patient Instructions (Addendum)
Plan for Bronchoscopy    The Risks and Benefits of the Bronchoscopy procedure with Electromagnetic Navigational Bronchoscopy were explained to patient/family.  I have discussed the risk for Acute Bleeding, increased chance of Infection, increased chance of Respiratory Failure and Cardiac Arrest, increased chance of pneumothorax and collapsed lung, as well as increased Stroke and Death.  I have also explained to avoid all types of NSAIDs 1 week prior to procedure date  to decrease chance of bleeding, and also to avoid food and drinks the midnight prior to procedure.  The procedure consists of a video camera with a light source to be placed and inserted  into the lungs to  look for abnormal tissue and to obtain tissue samples by using needle and biopsy tools.  The patient/family understand the risks and benefits and have agreed to proceed with procedure.   OBTAIN SUPER D CT CHEST PRIOR TO PROCEDURE

## 2017-12-07 ENCOUNTER — Other Ambulatory Visit: Payer: Self-pay

## 2017-12-07 ENCOUNTER — Encounter
Admission: RE | Admit: 2017-12-07 | Discharge: 2017-12-07 | Disposition: A | Discharge status: Home / Self Care | Payer: Medicare Other | Source: Ambulatory Visit | Attending: Internal Medicine | Admitting: Internal Medicine

## 2017-12-07 DIAGNOSIS — Z01818 Encounter for other preprocedural examination: Secondary | ICD-10-CM

## 2017-12-07 DIAGNOSIS — R9431 Abnormal electrocardiogram [ECG] [EKG]: Secondary | ICD-10-CM

## 2017-12-07 DIAGNOSIS — R918 Other nonspecific abnormal finding of lung field: Secondary | ICD-10-CM

## 2017-12-07 HISTORY — DX: Dyspnea, unspecified: R06.00

## 2017-12-07 LAB — BASIC METABOLIC PANEL
ANION GAP: 7 (ref 5–15)
BUN: 10 mg/dL (ref 6–20)
CALCIUM: 9.2 mg/dL (ref 8.9–10.3)
CO2: 27 mmol/L (ref 22–32)
Chloride: 103 mmol/L (ref 98–111)
Creatinine, Ser: 0.74 mg/dL (ref 0.44–1.00)
Glucose, Bld: 90 mg/dL (ref 70–99)
Potassium: 4.3 mmol/L (ref 3.5–5.1)
Sodium: 137 mmol/L (ref 135–145)

## 2017-12-07 LAB — CBC
HCT: 38.4 % (ref 36.0–46.0)
Hemoglobin: 12.4 g/dL (ref 12.0–15.0)
MCH: 30.3 pg (ref 26.0–34.0)
MCHC: 32.3 g/dL (ref 30.0–36.0)
MCV: 93.9 fL (ref 80.0–100.0)
NRBC: 0 % (ref 0.0–0.2)
PLATELETS: 390 10*3/uL (ref 150–400)
RBC: 4.09 MIL/uL (ref 3.87–5.11)
RDW: 12.1 % (ref 11.5–15.5)
WBC: 9.8 10*3/uL (ref 4.0–10.5)

## 2017-12-07 NOTE — Patient Instructions (Signed)
Your procedure is scheduled on: December 12, 2017  Report to [facility]    DO NOT STOP ON THE FIRST FLOOR TO REGISTER  To find out your arrival time please call [phone] between 1PM - 3PM on Friday, December 6,2019  Remember: Instructions that are not followed completely may result in serious medical risk,  up to and including death, or upon the discretion of your surgeon and anesthesiologist your  surgery may need to be rescheduled.     _X__ 1. Do not eat food after midnight the night before your procedure.                 No gum chewing or hard candies.                     ABSOLUTELY NOTHING SOLID IN YOUR MOUTH AFTER MIDNIGHT                 You may drink clear liquids up to 2 hours before you are scheduled to arrive for your surgery-                  DO not drink clear liquids within 2 hours of the start of your surgery.                  Clear Liquids include:  water, apple juice without pulp, clear carbohydrate                 drink such as Clearfast of Gatorade, Black Coffee or Tea (Do not add                 anything to coffee or tea).  __X__2.  On the morning of surgery brush your teeth with toothpaste and water,                    You may rinse your mouth with mouthwash if you wish.                                 Do not swallow any toothpaste of mouthwash.     _X__ 3.  No Alcohol for 24 hours before or after surgery.   _X__ 4.  Do Not Smoke or use e-cigarettes For 24 Hours Prior to Your Surgery.                 Do not use any chewable tobacco products for at least 6 hours prior to                 surgery.  ____  5.  Bring all medications with you on the day of surgery if instructed.   ____  6.  Notify your doctor if there is any change in your medical condition      (cold, fever, infections).     Do not wear jewelry, make-up, hairpins, clips or nail polish. Do not wear lotions, powders, or perfumes. You may wear  deodorant. Do not shave 48 hours prior to surgery. Men may shave face and neck. Do not bring valuables to the hospital.    [facility] is not responsible for any belongings or valuables.  Contacts, dentures or bridgework may not be worn into surgery. Leave your suitcase in the car. After surgery it may be brought to your room. For patients admitted to the hospital, discharge time is determined by  your treatment team.   Patients discharged the day of surgery will not be allowed to drive home.   Please read over the following fact sheets that you were given:                            PREPARING FOR SURGERY     __X__ Take these medicines the morning of surgery with A SIP OF WATER:    1. ESTRADIOL  2. SYMBICORT, PLEASE BRING WITH YOU TO SURGERY  3. VITAMIN D3  4.  5.  6.  ____ Fleet Enema (as directed)   _X___ SHOWER THE MORNING OF SURGERY  _X___ Use inhalers on the day of surgery. PLEASE BRING WITH YOU  __X__ Stop ALL ASPIRIN PRODUCTS NOW  __X__ Stop Anti-inflammatories NOW            THIS INCLUDES GLUCOSAMINE   __X__ Stop supplements until after surgery.                  STOP BIOTIN NOW   ____ Bring C-Pap to the hospital.   [place].  HAVE SOFT AND COLD FOODS TO EAT ONCE HOME

## 2017-12-08 ENCOUNTER — Other Ambulatory Visit: Payer: Medicaid Other

## 2017-12-08 NOTE — Pre-Procedure Instructions (Signed)
EKG OK BY DR Amie Critchley

## 2017-12-09 ENCOUNTER — Other Ambulatory Visit: Payer: Self-pay | Admitting: Otolaryngology

## 2017-12-09 ENCOUNTER — Ambulatory Visit
Admission: RE | Admit: 2017-12-09 | Discharge: 2017-12-09 | Disposition: A | Discharge status: Home / Self Care | Payer: Medicare Other | Source: Ambulatory Visit | Attending: Internal Medicine | Admitting: Internal Medicine

## 2017-12-09 DIAGNOSIS — R918 Other nonspecific abnormal finding of lung field: Secondary | ICD-10-CM | POA: Insufficient documentation

## 2017-12-09 DIAGNOSIS — R9389 Abnormal findings on diagnostic imaging of other specified body structures: Secondary | ICD-10-CM | POA: Insufficient documentation

## 2017-12-09 DIAGNOSIS — H9313 Tinnitus, bilateral: Secondary | ICD-10-CM

## 2017-12-12 ENCOUNTER — Ambulatory Visit: Payer: Medicare Other | Admitting: Anesthesiology

## 2017-12-12 ENCOUNTER — Other Ambulatory Visit: Payer: Self-pay

## 2017-12-12 ENCOUNTER — Inpatient Hospital Stay: Payer: Medicare Other

## 2017-12-12 ENCOUNTER — Encounter
Admission: RE | Disposition: A | Discharge status: Home / Self Care | Payer: Self-pay | Source: Ambulatory Visit | Attending: Internal Medicine

## 2017-12-12 ENCOUNTER — Ambulatory Visit: Payer: Medicare Other

## 2017-12-12 ENCOUNTER — Encounter: Payer: Self-pay | Admitting: *Deleted

## 2017-12-12 ENCOUNTER — Inpatient Hospital Stay
Admission: RE | Admit: 2017-12-12 | Discharge: 2017-12-14 | DRG: 168 | Disposition: A | Discharge status: Home / Self Care | Payer: Medicare Other | Source: Ambulatory Visit | Attending: Internal Medicine | Admitting: Internal Medicine

## 2017-12-12 DIAGNOSIS — Z91040 Latex allergy status: Secondary | ICD-10-CM

## 2017-12-12 DIAGNOSIS — J939 Pneumothorax, unspecified: Secondary | ICD-10-CM | POA: Diagnosis not present

## 2017-12-12 DIAGNOSIS — Z881 Allergy status to other antibiotic agents status: Secondary | ICD-10-CM

## 2017-12-12 DIAGNOSIS — J9383 Other pneumothorax: Principal | ICD-10-CM | POA: Diagnosis present

## 2017-12-12 DIAGNOSIS — Z882 Allergy status to sulfonamides status: Secondary | ICD-10-CM | POA: Diagnosis not present

## 2017-12-12 DIAGNOSIS — Z888 Allergy status to other drugs, medicaments and biological substances status: Secondary | ICD-10-CM | POA: Diagnosis not present

## 2017-12-12 DIAGNOSIS — Z7951 Long term (current) use of inhaled steroids: Secondary | ICD-10-CM | POA: Diagnosis not present

## 2017-12-12 DIAGNOSIS — R0602 Shortness of breath: Secondary | ICD-10-CM

## 2017-12-12 DIAGNOSIS — Z79899 Other long term (current) drug therapy: Secondary | ICD-10-CM | POA: Diagnosis not present

## 2017-12-12 DIAGNOSIS — Z9889 Other specified postprocedural states: Secondary | ICD-10-CM

## 2017-12-12 DIAGNOSIS — K219 Gastro-esophageal reflux disease without esophagitis: Secondary | ICD-10-CM | POA: Diagnosis present

## 2017-12-12 DIAGNOSIS — R911 Solitary pulmonary nodule: Secondary | ICD-10-CM

## 2017-12-12 DIAGNOSIS — J4520 Mild intermittent asthma, uncomplicated: Secondary | ICD-10-CM | POA: Diagnosis present

## 2017-12-12 DIAGNOSIS — Z79818 Long term (current) use of other agents affecting estrogen receptors and estrogen levels: Secondary | ICD-10-CM | POA: Diagnosis not present

## 2017-12-12 DIAGNOSIS — Z885 Allergy status to narcotic agent status: Secondary | ICD-10-CM

## 2017-12-12 DIAGNOSIS — Z09 Encounter for follow-up examination after completed treatment for conditions other than malignant neoplasm: Secondary | ICD-10-CM

## 2017-12-12 DIAGNOSIS — Z886 Allergy status to analgesic agent status: Secondary | ICD-10-CM

## 2017-12-12 HISTORY — PX: FLEXIBLE BRONCHOSCOPY: SHX5094

## 2017-12-12 HISTORY — PX: ELECTROMAGNETIC NAVIGATION BROCHOSCOPY: SHX5369

## 2017-12-12 HISTORY — DX: Other pneumothorax: J93.83

## 2017-12-12 LAB — CBC WITH DIFFERENTIAL/PLATELET

## 2017-12-12 LAB — BASIC METABOLIC PANEL
Anion gap: 7 (ref 5–15)
BUN: 7 mg/dL (ref 6–20)
CO2: 24 mmol/L (ref 22–32)
Calcium: 8.5 mg/dL — ABNORMAL LOW (ref 8.9–10.3)
Chloride: 105 mmol/L (ref 98–111)
Creatinine, Ser: 0.73 mg/dL (ref 0.44–1.00)
GFR calc Af Amer: >60 mL/min (ref >60)
GFR calc non Af Amer: >60 mL/min (ref >60)
Glucose, Bld: 94 mg/dL (ref 70–99)
Potassium: 4.2 mmol/L (ref 3.5–5.1)
Sodium: 136 mmol/L (ref 135–145)

## 2017-12-12 LAB — TROPONIN I: Troponin I: <0.03 ng/mL (ref <0.03)

## 2017-12-12 SURGERY — BRONCHOSCOPY, FLEXIBLE
Anesthesia: General | Laterality: Left

## 2017-12-12 MED ORDER — PROPOFOL 10 MG/ML IV BOLUS
INTRAVENOUS | Status: AC
  Filled 2017-12-12: qty 20

## 2017-12-12 MED ORDER — ROCURONIUM BROMIDE 100 MG/10ML IV SOLN
INTRAVENOUS | Status: DC | PRN
  Administered 2017-12-12: 15 mg via INTRAVENOUS
  Administered 2017-12-12: 5 mg via INTRAVENOUS

## 2017-12-12 MED ORDER — FAMOTIDINE 20 MG PO TABS
ORAL_TABLET | ORAL | Status: AC
  Administered 2017-12-12: 20 mg via ORAL
  Filled 2017-12-12: qty 1

## 2017-12-12 MED ORDER — LACTATED RINGERS IV SOLN
INTRAVENOUS | Status: DC
  Administered 2017-12-12: 13:00:00 via INTRAVENOUS

## 2017-12-12 MED ORDER — FENTANYL CITRATE (PF) 100 MCG/2ML IJ SOLN
INTRAMUSCULAR | Status: DC | PRN
  Administered 2017-12-12 (×2): 50 ug via INTRAVENOUS

## 2017-12-12 MED ORDER — SUGAMMADEX SODIUM 500 MG/5ML IV SOLN
INTRAVENOUS | Status: AC
  Filled 2017-12-12: qty 5

## 2017-12-12 MED ORDER — IPRATROPIUM-ALBUTEROL 0.5-2.5 (3) MG/3ML IN SOLN: 3.0000 mL | RESPIRATORY_TRACT | Status: DC | PRN

## 2017-12-12 MED ORDER — SUGAMMADEX SODIUM 200 MG/2ML IV SOLN
INTRAVENOUS | Status: DC | PRN
  Administered 2017-12-12: 150 mg via INTRAVENOUS

## 2017-12-12 MED ORDER — SODIUM CHLORIDE FLUSH 0.9 % IV SOLN
INTRAVENOUS | Status: AC
  Administered 2017-12-12: 3 mL via INTRAVENOUS
  Filled 2017-12-12: qty 10

## 2017-12-12 MED ORDER — SODIUM CHLORIDE 0.9% FLUSH
3.0000 mL | Freq: Two times a day (BID) | INTRAVENOUS | Status: DC
  Administered 2017-12-12 – 2017-12-14 (×4): 3 mL via INTRAVENOUS

## 2017-12-12 MED ORDER — ONDANSETRON HCL 4 MG/2ML IJ SOLN: 4.0000 mg | Freq: Four times a day (QID) | INTRAMUSCULAR | Status: DC | PRN

## 2017-12-12 MED ORDER — PANTOPRAZOLE SODIUM 40 MG IV SOLR
40.0000 mg | INTRAVENOUS | Status: DC
  Administered 2017-12-12 – 2017-12-13 (×2): 40 mg via INTRAVENOUS
  Filled 2017-12-12 (×2): qty 40

## 2017-12-12 MED ORDER — ROCURONIUM BROMIDE 50 MG/5ML IV SOLN
INTRAVENOUS | Status: AC
  Filled 2017-12-12: qty 1

## 2017-12-12 MED ORDER — CLONAZEPAM 1 MG PO TBDP: 1.0000 mg | ORAL_TABLET | Freq: Two times a day (BID) | ORAL | Status: DC | PRN

## 2017-12-12 MED ORDER — MORPHINE SULFATE (PF) 2 MG/ML IV SOLN
2.0000 mg | INTRAVENOUS | Status: DC | PRN
  Administered 2017-12-12 – 2017-12-13 (×5): 2 mg via INTRAVENOUS
  Filled 2017-12-12 (×5): qty 1

## 2017-12-12 MED ORDER — BISACODYL 5 MG PO TBEC: 5.0000 mg | DELAYED_RELEASE_TABLET | Freq: Every day | ORAL | Status: DC | PRN

## 2017-12-12 MED ORDER — FAMOTIDINE 20 MG PO TABS
20.0000 mg | ORAL_TABLET | Freq: Once | ORAL | Status: AC
  Administered 2017-12-12: 20 mg via ORAL

## 2017-12-12 MED ORDER — SUCCINYLCHOLINE CHLORIDE 20 MG/ML IJ SOLN
INTRAMUSCULAR | Status: AC
  Filled 2017-12-12: qty 1

## 2017-12-12 MED ORDER — MIDAZOLAM HCL 2 MG/2ML IJ SOLN
2.0000 mg | Freq: Once | INTRAMUSCULAR | Status: AC
  Administered 2017-12-12: 2 mg via INTRAVENOUS

## 2017-12-12 MED ORDER — OXYCODONE HCL 5 MG PO TABS
5.0000 mg | ORAL_TABLET | ORAL | Status: DC | PRN
  Administered 2017-12-12 – 2017-12-14 (×7): 5 mg via ORAL
  Filled 2017-12-12 (×7): qty 1

## 2017-12-12 MED ORDER — MIDAZOLAM HCL 2 MG/2ML IJ SOLN
INTRAMUSCULAR | Status: AC
  Filled 2017-12-12: qty 2

## 2017-12-12 MED ORDER — ACETAMINOPHEN 650 MG RE SUPP: 650.0000 mg | Freq: Four times a day (QID) | RECTAL | Status: DC | PRN

## 2017-12-12 MED ORDER — SODIUM CHLORIDE 0.9 % IV SOLN: 250.0000 mL | INTRAVENOUS | Status: DC | PRN

## 2017-12-12 MED ORDER — LIDOCAINE HCL (PF) 1 % IJ SOLN
INTRAMUSCULAR | Status: AC
  Filled 2017-12-12: qty 5

## 2017-12-12 MED ORDER — FENTANYL CITRATE (PF) 100 MCG/2ML IJ SOLN
INTRAMUSCULAR | Status: AC
  Filled 2017-12-12: qty 2

## 2017-12-12 MED ORDER — SODIUM CHLORIDE 0.9% FLUSH: 3.0000 mL | INTRAVENOUS | Status: DC | PRN

## 2017-12-12 MED ORDER — SUCCINYLCHOLINE CHLORIDE 20 MG/ML IJ SOLN
INTRAMUSCULAR | Status: DC | PRN
  Administered 2017-12-12: 100 mg via INTRAVENOUS

## 2017-12-12 MED ORDER — FENTANYL CITRATE (PF) 100 MCG/2ML IJ SOLN
INTRAMUSCULAR | Status: AC
  Administered 2017-12-12: 25 ug via INTRAVENOUS
  Filled 2017-12-12: qty 2

## 2017-12-12 MED ORDER — PROPOFOL 10 MG/ML IV BOLUS
INTRAVENOUS | Status: DC | PRN
  Administered 2017-12-12: 150 mg via INTRAVENOUS

## 2017-12-12 MED ORDER — MIDAZOLAM HCL 5 MG/5ML IJ SOLN
INTRAMUSCULAR | Status: DC | PRN
  Administered 2017-12-12 (×2): 2 mg via INTRAVENOUS

## 2017-12-12 MED ORDER — PENTAFLUOROPROP-TETRAFLUOROETH EX AERO
INHALATION_SPRAY | CUTANEOUS | Status: AC
  Filled 2017-12-12: qty 116

## 2017-12-12 MED ORDER — ADULT MULTIVITAMIN W/MINERALS CH
1.0000 | ORAL_TABLET | Freq: Every day | ORAL | Status: DC
  Administered 2017-12-13 – 2017-12-14 (×2): 1 via ORAL
  Filled 2017-12-12 (×2): qty 1

## 2017-12-12 MED ORDER — DICLOFENAC SODIUM 1 % TD GEL
2.0000 g | Freq: Four times a day (QID) | TRANSDERMAL | Status: DC
  Filled 2017-12-12: qty 100

## 2017-12-12 MED ORDER — FENTANYL CITRATE (PF) 100 MCG/2ML IJ SOLN
25.0000 ug | INTRAMUSCULAR | Status: AC | PRN
  Administered 2017-12-12 (×6): 25 ug via INTRAVENOUS

## 2017-12-12 MED ORDER — ENOXAPARIN SODIUM 40 MG/0.4ML ~~LOC~~ SOLN
40.0000 mg | SUBCUTANEOUS | Status: DC
  Administered 2017-12-13 – 2017-12-14 (×2): 40 mg via SUBCUTANEOUS
  Filled 2017-12-12 (×2): qty 0.4

## 2017-12-12 MED ORDER — POLYETHYLENE GLYCOL 3350 17 G PO PACK: 17.0000 g | PACK | Freq: Every day | ORAL | Status: DC | PRN

## 2017-12-12 MED ORDER — LIDOCAINE HCL (CARDIAC) PF 100 MG/5ML IV SOSY
PREFILLED_SYRINGE | INTRAVENOUS | Status: DC | PRN
  Administered 2017-12-12: 60 mg via INTRAVENOUS

## 2017-12-12 MED ORDER — ONDANSETRON HCL 4 MG/2ML IJ SOLN: 4.0000 mg | Freq: Once | INTRAMUSCULAR | Status: DC | PRN

## 2017-12-12 MED ORDER — ACETAMINOPHEN 325 MG PO TABS: 650.0000 mg | ORAL_TABLET | Freq: Four times a day (QID) | ORAL | Status: DC | PRN

## 2017-12-12 MED ORDER — MIDAZOLAM HCL 2 MG/2ML IJ SOLN
INTRAMUSCULAR | Status: AC
  Administered 2017-12-12: 2 mg via INTRAVENOUS
  Filled 2017-12-12: qty 2

## 2017-12-12 MED ORDER — ESTRADIOL 1 MG PO TABS
2.0000 mg | ORAL_TABLET | Freq: Every day | ORAL | Status: DC
  Administered 2017-12-13 – 2017-12-14 (×2): 2 mg via ORAL
  Filled 2017-12-12 (×2): qty 2

## 2017-12-12 MED ORDER — FENTANYL CITRATE (PF) 100 MCG/2ML IJ SOLN
25.0000 ug | Freq: Once | INTRAMUSCULAR | Status: AC
  Administered 2017-12-12: 25 ug via INTRAVENOUS

## 2017-12-12 MED ORDER — ONDANSETRON HCL 4 MG/2ML IJ SOLN
INTRAMUSCULAR | Status: AC
  Filled 2017-12-12: qty 2

## 2017-12-12 MED ORDER — SEVOFLURANE IN SOLN
RESPIRATORY_TRACT | Status: AC
  Filled 2017-12-12: qty 250

## 2017-12-12 MED ORDER — ONDANSETRON HCL 4 MG PO TABS: 4.0000 mg | ORAL_TABLET | Freq: Four times a day (QID) | ORAL | Status: DC | PRN

## 2017-12-12 MED ORDER — MOMETASONE FURO-FORMOTEROL FUM 100-5 MCG/ACT IN AERO
2.0000 | INHALATION_SPRAY | Freq: Two times a day (BID) | RESPIRATORY_TRACT | Status: DC
  Administered 2017-12-12 – 2017-12-14 (×4): 2 via RESPIRATORY_TRACT
  Filled 2017-12-12: qty 8.8

## 2017-12-12 MED ORDER — ONDANSETRON HCL 4 MG/2ML IJ SOLN
INTRAMUSCULAR | Status: DC | PRN
  Administered 2017-12-12: 4 mg via INTRAVENOUS

## 2017-12-12 MED ORDER — TEMAZEPAM 15 MG PO CAPS
15.0000 mg | ORAL_CAPSULE | Freq: Every evening | ORAL | Status: DC | PRN
  Administered 2017-12-13: 15 mg via ORAL
  Filled 2017-12-12: qty 1

## 2017-12-12 NOTE — OR Nursing (Signed)
Arrived to post op to be discharged home and reports having pain post bronchoscopy in her back, front left chest and pain upon inhaling. Paged Dr Mortimer Fries and ordered a portable chest x-ray. Vital signs are stable. o2 sats 97%. No c/o shortness of breath.

## 2017-12-12 NOTE — Anesthesia Preprocedure Evaluation (Signed)
Anesthesia Evaluation  Patient identified by MRN, date of birth, ID band Patient awake    Reviewed: Allergy & Precautions, NPO status , Patient's Chart, lab work & pertinent test results  History of Anesthesia Complications (+) history of anesthetic complications (experienced paralysis prior to being asleep)  Airway Mallampati: II       Dental   Pulmonary asthma , neg sleep apnea, neg COPD,           Cardiovascular (-) hypertension(-) Past MI and (-) CHF (-) dysrhythmias (-) Valvular Problems/Murmurs     Neuro/Psych neg Seizures Anxiety    GI/Hepatic Neg liver ROS, neg GERD  ,  Endo/Other  neg diabetes  Renal/GU negative Renal ROS     Musculoskeletal   Abdominal   Peds  Hematology   Anesthesia Other Findings   Reproductive/Obstetrics                             Anesthesia Physical Anesthesia Plan  ASA: II  Anesthesia Plan: General   Post-op Pain Management:    Induction:   PONV Risk Score and Plan: 3 and Dexamethasone, Ondansetron and Midazolam  Airway Management Planned: Oral ETT  Additional Equipment:   Intra-op Plan:   Post-operative Plan:   Informed Consent: I have reviewed the patients History and Physical, chart, labs and discussed the procedure including the risks, benefits and alternatives for the proposed anesthesia with the patient or authorized representative who has indicated his/her understanding and acceptance.     Plan Discussed with:   Anesthesia Plan Comments:         Anesthesia Quick Evaluation

## 2017-12-12 NOTE — Anesthesia Procedure Notes (Signed)
Procedure Name: Intubation Date/Time: 12/12/2017 1:08 PM Performed by: Dionne Bucy, CRNA Pre-anesthesia Checklist: Patient identified, Patient being monitored, Timeout performed, Emergency Drugs available and Suction available Patient Re-evaluated:Patient Re-evaluated prior to induction Oxygen Delivery Method: Circle system utilized Preoxygenation: Pre-oxygenation with 100% oxygen Induction Type: IV induction Ventilation: Mask ventilation without difficulty Laryngoscope Size: Mac and 3 Grade View: Grade II Tube type: Oral Tube size: 8.5 mm Number of attempts: 1 Airway Equipment and Method: Stylet Placement Confirmation: ETT inserted through vocal cords under direct vision,  positive ETCO2 and breath sounds checked- equal and bilateral Secured at: 21 cm Tube secured with: Tape Dental Injury: Teeth and Oropharynx as per pre-operative assessment

## 2017-12-12 NOTE — Interval H&P Note (Signed)
History and Physical Interval Note:  12/12/2017 1:19 PM  Carmen Grimes  has presented today for surgery, with the diagnosis of LEFT LUNG MASS  The various methods of treatment have been discussed with the patient and family. After consideration of risks, benefits and other options for treatment, the patient has consented to  Procedure(s): FLEXIBLE BRONCHOSCOPY (Left) ELECTROMAGNETIC NAVIGATION BRONCHOSCOPY (Left) as a surgical intervention .  The patient's history has been reviewed, patient examined, no change in status, stable for surgery.  I have reviewed the patient's chart and labs.  Questions were answered to the patient's satisfaction.     The Risks and Benefits of the Bronchoscopy procedure with ENB were explained to patient/family.  I have discussed the risk for Acute Bleeding, increased chance of Infection, increased chance of Respiratory Failure and Cardiac Arrest, increased chance of pneumothorax and collapsed lung, as well as increased Stroke and Death.  I have also explained to avoid all types of NSAIDs 1 week prior to procedure date  to decrease chance of bleeding, and also to avoid food and drinks the midnight prior to procedure.  The procedure consists of a video camera with a light source to be placed and inserted  into the lungs to  look for abnormal tissue and to obtain tissue samples by using needle and biopsy tools.  The patient/family understand the risks and benefits and have agreed to proceed with procedure.  Flora Lipps

## 2017-12-12 NOTE — H&P (Signed)
[facility] [place] NAME: Carmen Grimes    MR#:  [id number]  DATE OF BIRTH:  [birth year]  DATE OF ADMISSION:  12/12/2017  PRIMARY CARE PHYSICIAN: Perrin Maltese, MD   REQUESTING/REFERRING PHYSICIAN:   CHIEF COMPLAINT:  No chief complaint on file.   HISTORY OF PRESENT ILLNESS: Carmen Grimes  is a 57 y.o. female with a known history per below presented for routine lung biopsy of left pulmonary nodule, after procedure patient developed subsequent chest pain, x-ray noted for large left pneumothorax, chest tube placed in the PACU, hospitalist service asked to admit for further evaluation/care, patient complaining of left-sided chest pain, anxiety only, patient now be admitted for acute left-sided pneumothorax.  PAST MEDICAL HISTORY:   Past Medical History:  Diagnosis Date  . Asthma    controlled  . Dyspnea 12/2017   difficulty getting her breath at all times  . GERD (gastroesophageal reflux disease)   . H/O: GI bleed   . Osteoarthritis    knees    PAST SURGICAL HISTORY:  Past Surgical History:  Procedure Laterality Date  . ABDOMINAL HYSTERECTOMY  2002  . ANTERIOR FUSION CERVICAL SPINE  2009  . COLONOSCOPY  2013   normal  . ESOPHAGOGASTRODUODENOSCOPY  1990   GI bleed  . KNEE ARTHROSCOPY Right 2010   x3  . KNEE SURGERY Left    Arthroscopy x1  . LAPAROSCOPIC SUPRACERVICAL HYSTERECTOMY  2002   with BSO for fibroids/ AUB/ pelvic pain  . LAPAROSCOPIC TUBAL LIGATION  1982  . SHOULDER SURGERY Right    rotator cuff repair  . SHOULDER SURGERY Right 2011   second rotator cuff repair  . tubal reanastamosis  1985    SOCIAL HISTORY:  Social History   Tobacco Use  . Smoking status: Never Smoker  . Smokeless tobacco: Never Used  Substance Use Topics  . Alcohol use: No    FAMILY HISTORY:  Family History  Problem Relation Age of Onset  . Diabetes Mother   . Dementia Mother        Alzheimers  . Hypertension Mother   . Glaucoma  Mother   . Prostate cancer Father 65  . Stroke Father   . Arthritis Father   . Osteoporosis Father   . Prostate cancer Brother   . Breast cancer Neg Hx     DRUG ALLERGIES:  Allergies  Allergen Reactions  . Shellfish Allergy Anaphylaxis    Other reaction(s): anaphylaxis/angioedema Other reaction(s): anaphylaxis/angioedema  Betadine on skin is okay  . Tramadol Other (See Comments)    "blacked out", found wandering in the street  . Ascorbic Acid Hives and Nausea And Vomiting    HIVES / VOMITING   . Celecoxib Other (See Comments)    GI bleed Other reaction(s): gi distress, Other (comments), Other (See Comments) GI BLEED       . Codeine Itching and Nausea Only    Other reaction(s): mild rash/itching  . Gabapentin Other (See Comments)    Drowsiness. Like a zombie, not functional   . Hydrocodone Other (See Comments)    Hair falls out.  Marland Kitchen Hydrocodone-Acetaminophen     Other reaction(s): other/intolerance HAIR FALLS OUT   . Ibuprofen Other (See Comments)    "Blood disorder", GI bleed. Other reaction(s): gi distress, Other (comments), Other (See Comments)   . Naproxen Other (See Comments)    GI bleed Other reaction(s): Other (comments), Other (See Comments)        . Prednisone Other (See  Comments)    Tremors, severe  . Singulair [Montelukast] Other (See Comments)    Syncope..passed out  . Strawberry Extract Hives, Nausea And Vomiting and Nausea Only    Severe vomitting  . Doxycycline Other (See Comments)    Unknown reaction. Other reaction(s): Other (See Comments), Unknown, Unknown (comments) Pt does not remember   . Eggs Or Egg-Derived Products Rash and Other (See Comments)    rash  . Latex Rash and Itching    Other reaction(s): mild rash/itching  . Nsaids Nausea And Vomiting and Other (See Comments)    Gastric bleed        . Strawberry Flavor Hives  . Sulfamethoxazole-Trimethoprim Other (See Comments)    Other reaction(s): Other (See Comments),  other/intolerance MAKES PAIN WORSE  Increases pain Increases pain     REVIEW OF SYSTEMS:   CONSTITUTIONAL: No fever, fatigue or weakness.  EYES: No blurred or double vision.  EARS, NOSE, AND THROAT: No tinnitus or ear pain.  RESPIRATORY: No cough, shortness of breath, wheezing or hemoptysis.  CARDIOVASCULAR: + chest pain, no orthopnea, edema.  GASTROINTESTINAL: No nausea, vomiting, diarrhea or abdominal pain.  GENITOURINARY: No dysuria, hematuria.  ENDOCRINE: No polyuria, nocturia,  HEMATOLOGY: No anemia, easy bruising or bleeding SKIN: No rash or lesion. MUSCULOSKELETAL: No joint pain or arthritis.   NEUROLOGIC: No tingling, numbness, weakness.  PSYCHIATRY: No anxiety or depression.   MEDICATIONS AT HOME:  Prior to Admission medications   Medication Sig Start Date End Date Taking? Authorizing Provider  estradiol (ESTRACE) 2 MG tablet Take 1 tablet (2 mg total) by mouth daily. 02/17/17  Yes Dalia Heading, CNM  SYMBICORT 80-4.5 MCG/ACT inhaler Inhale 2 puffs into the lungs 2 (two) times daily.  03/09/17  Yes [provider]  VENTOLIN HFA 108 (90 Base) MCG/ACT inhaler Inhale 2 puffs into the lungs every 6 (six) hours as needed.  11/14/17  Yes [provider]  Biotin 10000 MCG TABS Take 1 tablet by mouth daily.    [provider]  Cholecalciferol (VITAMIN D3) 50000 units CAPS TAKE ONE CAPSULE BY MOUTH ONE TIME PER WEEK 03/04/17   [provider]  diclofenac sodium (VOLTAREN) 1 % GEL APPLY 2 G TOPICALLY 3 (THREE) TIMES DAILY AS NEEDED 12/06/16   [provider]  Misc Natural Products (GLUCOSAMINE CHOND COMPLEX/MSM PO) Take 1 capsule by mouth daily.     [provider]      PHYSICAL EXAMINATION:   VITAL SIGNS: Blood pressure (!) 144/94, pulse (!) 128, temperature 98.1 F (36.7 C), temperature source Tympanic, resp. rate 15, SpO2 100 %.  GENERAL:  57 y.o.-year-old patient lying in the bed with no acute distress.  EYES: Pupils  equal, round, reactive to light and accommodation. No scleral icterus. Extraocular muscles intact.  HEENT: Head atraumatic, normocephalic. Oropharynx and nasopharynx clear.  NECK:  Supple, no jugular venous distention. No thyroid enlargement, no tenderness.  LUNGS: Diminished breath sounds on left. No use of accessory muscles of respiration.  CARDIOVASCULAR: S1, S2 normal. No murmurs, rubs, or gallops.  ABDOMEN: Soft, nontender, nondistended. Bowel sounds present. No organomegaly or mass.  EXTREMITIES: No pedal edema, cyanosis, or clubbing.  NEUROLOGIC: Cranial nerves II through XII are intact. Muscle strength 5/5 in all extremities. Sensation intact. Gait not checked.  PSYCHIATRIC: The patient is alert and oriented x 3.  Anxious appearing sKIN: No obvious rash, lesion, or ulcer.   LABORATORY PANEL:   CBC Recent Labs  Lab 12/07/17 1631  WBC 9.8  HGB 12.4  HCT 38.4  PLT 390  MCV 93.9  MCH 30.3  MCHC 32.3  RDW 12.1   ------------------------------------------------------------------------------------------------------------------  Chemistries  Recent Labs  Lab 12/07/17 1631  NA 137  K 4.3  CL 103  CO2 27  GLUCOSE 90  BUN 10  CREATININE 0.74  CALCIUM 9.2   ------------------------------------------------------------------------------------------------------------------ estimated creatinine clearance is 67.5 mL/min (by C-G formula based on SCr of 0.74 mg/dL). ------------------------------------------------------------------------------------------------------------------ No results for input(s): TSH, T4TOTAL, T3FREE, THYROIDAB in the last 72 hours.  Invalid input(s): FREET3   Coagulation profile No results for input(s): INR, PROTIME in the last 168 hours. ------------------------------------------------------------------------------------------------------------------- No results for input(s): DDIMER in the last 72  hours. -------------------------------------------------------------------------------------------------------------------  Cardiac Enzymes No results for input(s): CKMB, TROPONINI, MYOGLOBIN in the last 168 hours.  Invalid input(s): CK ------------------------------------------------------------------------------------------------------------------ Invalid input(s): POCBNP  ---------------------------------------------------------------------------------------------------------------  Urinalysis    Component Value Date/Time   COLORURINE STRAW (A) 06/09/2016 1422   APPEARANCEUR CLEAR (A) 06/09/2016 1422   LABSPEC 1.004 (L) 06/09/2016 1422   PHURINE 6.0 06/09/2016 1422   GLUCOSEU NEGATIVE 06/09/2016 1422   HGBUR NEGATIVE 06/09/2016 1422   BILIRUBINUR NEGATIVE 06/09/2016 1422   KETONESUR NEGATIVE 06/09/2016 1422   PROTEINUR NEGATIVE 06/09/2016 1422   NITRITE NEGATIVE 06/09/2016 1422   LEUKOCYTESUR NEGATIVE 06/09/2016 1422     RADIOLOGY: Dg Chest Port 1 View  Result Date: 12/12/2017 CLINICAL DATA:  Shortness of breath post bronchoscopy EXAM: PORTABLE CHEST 1 VIEW COMPARISON:  CT 12/09/2017 FINDINGS: Right lung is clear. Moderate to large left pneumothorax with approximately 5.3 cm pleural-parenchymal separation at the left apex. Pneumothorax is visible medially and at the left lateral lung base. Atelectasis of the left lung with increased opacity at the left base. No midline shift. Normal heart size. Surgical hardware in the cervical spine. IMPRESSION: Moderate to large left pneumothorax without midline shift. Critical Value/emergent results were called by telephone at the time of interpretation on 12/12/2017 at 5:24 pm to Dr. Flora Lipps , who verbally acknowledged these results . Electronically Signed   By: Donavan Foil M.D.   On: 12/12/2017 17:25   Dg C-arm 1-60 Min-no Report  Result Date: 12/12/2017 Fluoroscopy was utilized by the requesting physician.  No radiographic  interpretation.    EKG: Orders placed or performed during the hospital encounter of 12/07/17  . EKG 12-Lead  . EKG 12-Lead    IMPRESSION AND PLAN: *Acute left pneumothorax status post lung biopsy *Chronic left lung nodule *Chronic asthma without exacerbation *GERD, chronic   Admit to regular nursing for bed, chest tube to continuous suctioning, chest x-ray in the morning, adult pain protocol, pulmonary consulted for chest tube management, supplemental oxygen with weaning as tolerated, check admission lab work, breathing treatments PRN  All the records are reviewed and case discussed with ED provider. Management plans discussed with the patient, family and they are in agreement.  CODE STATUS:full    TOTAL TIME TAKING CARE OF THIS PATIENT: 40 minutes.    Avel Peace Salary M.D on 12/12/2017   Between 7am to 6pm - Pager - [phone]  After 6pm go to [email or web address] - password [email or web address] [facility]  Office  [phone]  CC: Primary care physician; Perrin Maltese, MD   Note: This dictation was prepared with Dragon dictation along with smaller phrase technology. Any transcriptional errors that result from this process are unintentional.

## 2017-12-12 NOTE — Progress Notes (Signed)
Spoke with Dr. Mortimer Fries after chest xray. MD will admit patient overnight observation and will notify hospitalist. Son Virl Axe at bedside and aware. Patient agreeable.

## 2017-12-12 NOTE — Discharge Instructions (Addendum)
Flexible Bronchoscopy, Care After This sheet gives you information about how to care for yourself after your procedure. Your health care provider may also give you more specific instructions. If you have problems or questions, contact your health care provider. What can I expect after the procedure? After the procedure, it is common to have the following symptoms for 24-48 hours:  A cough that is worse than it was before the procedure.  A low-grade fever.  A sore throat or hoarse voice.  Small streaks of blood in the mucus from your lungs (sputum), if tissue samples were removed (biopsy).  Follow these instructions at home: Eating and drinking  Do not eat or drink anything (including water) for 2 hours after your procedure, or until your numbing medicine (local anesthetic) has worn off. Having a numb throat increases your risk of burning yourself or choking.  After your numbness is gone and your cough and gag reflexes have returned, you may start eating only soft foods and slowly drinking liquids.  The day after the procedure, return to your normal diet. Driving  Do not drive for 24 hours if you were given a medicine to help you relax (sedative).  Do not drive or use heavy machinery while taking prescription pain medicine. General instructions  Take over-the-counter and prescription medicines only as told by your health care provider.  Return to your normal activities as told by your health care provider. Ask your health care provider what activities are safe for you.  Do not use any products that contain nicotine or tobacco, such as cigarettes and e-cigarettes. If you need help quitting, ask your health care provider.  Keep all follow-up visits as told by your health care provider. This is important, especially if you had a biopsy taken. Get help right away if:  You have shortness of breath that gets worse.  You become light-headed or feel like you might faint.  You have  chest pain.  You cough up more than a small amount of blood.  The amount of blood you cough up increases. Summary  Common symptoms in the 24-48 hours following a flexible bronchoscopy include cough, low-grade fever, sore throat or hoarse voice, and blood-streaked mucus from the lungs (if you had a biopsy).  Do not eat or drink anything (including water) for 2 hours after your procedure, or until your local anesthetic has worn off. You can return to your normal diet the day after the procedure.  Get help right away if you develop worsening shortness of breath, have chest pain, become light-headed, or cough up more than a small amount of blood. This information is not intended to replace advice given to you by your health care provider. Make sure you discuss any questions you have with your health care provider. Document Released: 07/10/2004 Document Revised: 01/09/2016 Document Reviewed: 01/09/2016 Elsevier Interactive Patient Education  2017 [place]   1) The drugs that you were given will stay in your system until tomorrow so for the next 24 hours you should not:  A) Drive an automobile B) Make any legal decisions C) Drink any alcoholic beverage   2) You may resume regular meals tomorrow.  Today it is better to start with liquids and gradually work up to solid foods.  You may eat anything you prefer, but it is better to start with liquids, then soup and crackers, and gradually work up to solid foods.   3) Please notify your doctor immediately if you  have any unusual bleeding, trouble breathing, redness and pain at the surgery site, drainage, fever, or pain not relieved by medication.    4) Additional Instructions:        Please contact your physician with any problems or Same Day Surgery at [phone], Monday through Friday 6 am to 4 pm, or [place] at [facility] number at [phone].

## 2017-12-12 NOTE — Op Note (Signed)
Electromagnetic Navigation Bronchoscopy: Indication: lung nodule  Preoperative Diagnosis:lung nodule Post Procedure Diagnosis:lung nodule Consent: Verbal/Written  The Risks and Benefits of the procedure explained to patient/family prior to start of procedure and I have discussed the risk for acute bleeding, increased chance of infection, increased chance of respiratory failure and cardiac arrest and death.  I have also explained to avoid all types of NSAIDs to decrease chance of bleeding, and to avoid food and drinks the midnight prior to procedure.  The procedure consists of a video camera with a light source to be placed and inserted  into the lungs to  look for abnormal tissue and to obtain tissue samples by using needle and biopsy tools.  The patient/family understand the risks and benefits and have agreed to proceed with procedure.   Hand washing performed prior to starting the procedure.   Type of Anesthesia: see Anesthesiology records .   Procedure Performed:  Virtual Bronchoscopy with Multi-planar Image analysis, 3-D reconstruction of coronal, sagittal and multi-planar images for the purposes of planning real-time bronchoscopy using the iLogic Electromagnetic Navigation Bronchoscopy System (superDimension).  Description of Procedure: After obtaining informed consent from the patient, the above sedative and anesthetic measures were carried out, flexible fiberoptic bronchoscope was inserted via Endotracheal tube after patient was intubated by CNA/Anesthesiologist.   The virtual camera was then placed into the central portion of the trachea. The trachea itself was inspected.  The main carina, right and left midstem bronchus and all the segmental and subsegmental airways by virtual bronchoscopy were inspected. The camera was directed to standard registration points at the following centers: main carina, right upper lobe bronchus, right lower lobe bronchus, right middle lobe bronchus,  left upper lobe bronchus, and the left lower lobe bronchus. This data was transferred to the i-Logic ENB system for real-time bronchoscopy.   The scope was then navigated to the LUL lingula for tissue sampling The lesion was very close to myocardium-I was very cautious with obtaining specimens   Specimans Obtained:    Transbronchial Forceps Biopsy times:8    Fluoroscopy:  Fluoroscopy was utilized during the course of this procedure to assure that biopsies were taken in a safe manner under fluoroscopic guidance with spot films required.   Complications:None  Estimated Blood Loss: minimal approx 1cc  Monitoring:  The patient was monitored with continuous oximetry and received supplemental nasal cannula oxygen throughout the procedure. In addition, serial blood pressure measurements and continuous electrocardiography showed these physiologic parameters to remain tolerable throughout the procedure.   Assessment and Plan/Additional Comments: Follow up Pathology Reports    Corrin Parker, M.D.  [facility]  Medical Director [place] Director [facility] Cardio-Pulmonary Department

## 2017-12-12 NOTE — Progress Notes (Signed)
Coughed up a little blood

## 2017-12-12 NOTE — Transfer of Care (Signed)
Immediate Anesthesia Transfer of Care Note  Patient: Carmen Grimes  Procedure(s) Performed: FLEXIBLE BRONCHOSCOPY (Left ) ELECTROMAGNETIC NAVIGATION BRONCHOSCOPY (Left )  Patient Location: PACU  Anesthesia Type:General  Level of Consciousness: sedated  Airway & Oxygen Therapy: Patient Spontanous Breathing and Patient connected to face mask oxygen  Post-op Assessment: Report given to RN and Post -op Vital signs reviewed and stable  Post vital signs: Reviewed and stable  Last Vitals:  Vitals Value Taken Time  BP 17/69   Temp    Pulse 101 12/12/2017  2:00 PM  Resp 17 12/12/2017  2:00 PM  SpO2 100 % 12/12/2017  2:00 PM  Vitals shown include unvalidated device data.  Last Pain:  Vitals:   12/12/17 1215  TempSrc: Oral  PainSc: 0-No pain         Complications: No apparent anesthesia complications

## 2017-12-12 NOTE — Anesthesia Post-op Follow-up Note (Signed)
Anesthesia QCDR form completed.        

## 2017-12-12 NOTE — Op Note (Signed)
I was called about patient having some SOB and pleurisy with left sided pain. Patient is s/p ENB for Left lung nodule  CXR obtained STAT which showed large  Left Lung Pneumothorax     Chest Tube Insertion: Indication:pneumothorax Consent:verbal and written  Risks and benefits explained in detail including risk of infection, bleeding, respiratory failure and death..   Hand washing performed prior to starting the procedure.   Type of Anesthesia: 1 % Lidocaine.   Procedure: An active timeout was performed and correct patient, name, & ID confirmed.  After explaining risks and benefits, patient positioned correctly for chest tube placement. Patient was prepped - chlorohexadine preps,  Introducer needle was placed and a gush of Air was heard and expelled from needle Patient felt better instantaneously. Using Seldinger Technique, the introducer needle was removed and pigtail catheter was placed over needle into Second intercostal space into left lung.  Chest tube sutured in place and proper dressing used..   Findings: Gush if air heard upon insetion of needle, Chest tube placed bewteen 2-3 ribs mid clavicular  line.   Chest tube connected to: pleurovac .   Number of Attempts:1  Complications:none  Estimated Blood Loss:none  Chest radiograph indicated and ordered.   Operator: Izear Pine   Further recommendations-Hospitalist Dr Jerelyn Charles notified of admission for next 24 hrs to monitor PTX and chest tube.     Corrin Parker, M.D.  [facility]  Medical Director [place] Director [facility] Cardio-Pulmonary Department

## 2017-12-12 NOTE — Progress Notes (Signed)
Family Meeting Note  Advance Directive:yes  Today a meeting took place with the Patient.  Patient is able to participate  The following clinical team members were present during this meeting:MD  The following were discussed:Patient's diagnosis:PTX , Patient's progosis: Unable to determine and Goals for treatment: Full Code  Additional follow-up to be provided: prn  Time spent during discussion:20 minutes  Gorden Harms, MD

## 2017-12-12 NOTE — Anesthesia Postprocedure Evaluation (Signed)
Anesthesia Post Note  Patient: Carmen Grimes  Procedure(s) Performed: FLEXIBLE BRONCHOSCOPY (Left ) ELECTROMAGNETIC NAVIGATION BRONCHOSCOPY (Left )  Patient location during evaluation: PACU Anesthesia Type: General Level of consciousness: awake and alert Pain management: pain level controlled Vital Signs Assessment: post-procedure vital signs reviewed and stable Respiratory status: spontaneous breathing and respiratory function stable Cardiovascular status: stable Anesthetic complications: no     Last Vitals:  Vitals:   12/12/17 1416 12/12/17 1421  BP:  121/85  Pulse: 88 89  Resp: 14 14  Temp:    SpO2: 99% 99%    Last Pain:  Vitals:   12/12/17 1416  TempSrc:   PainSc: 9                  Kelisha Dall K

## 2017-12-12 NOTE — Progress Notes (Signed)
Dr. Mortimer Fries at bedside; MD notified by [facility] radiology of results. Dr. Mortimer Fries will insert chest tube. Patient agreeable. Son notified and will come back to hospital.

## 2017-12-13 ENCOUNTER — Inpatient Hospital Stay: Payer: Medicare Other

## 2017-12-13 ENCOUNTER — Encounter: Payer: Self-pay | Admitting: Internal Medicine

## 2017-12-13 ENCOUNTER — Telehealth: Payer: Self-pay

## 2017-12-13 DIAGNOSIS — J9383 Other pneumothorax: Principal | ICD-10-CM

## 2017-12-13 DIAGNOSIS — Z9889 Other specified postprocedural states: Secondary | ICD-10-CM

## 2017-12-13 LAB — SURGICAL PATHOLOGY

## 2017-12-13 LAB — CBC
HCT: 35.7 % — ABNORMAL LOW (ref 36.0–46.0)
HCT: 35.9 % — ABNORMAL LOW (ref 36.0–46.0)
Hemoglobin: 11.2 g/dL — ABNORMAL LOW (ref 12.0–15.0)
Hemoglobin: 11.5 g/dL — ABNORMAL LOW (ref 12.0–15.0)
MCH: 29.9 pg (ref 26.0–34.0)
MCH: 30.3 pg (ref 26.0–34.0)
MCHC: 31.2 g/dL (ref 30.0–36.0)
MCHC: 32.2 g/dL (ref 30.0–36.0)
MCV: 94.2 fL (ref 80.0–100.0)
MCV: 95.7 fL (ref 80.0–100.0)
PLATELETS: 360 10*3/uL (ref 150–400)
Platelets: 346 10*3/uL (ref 150–400)
RBC: 3.75 MIL/uL — ABNORMAL LOW (ref 3.87–5.11)
RBC: 3.79 MIL/uL — ABNORMAL LOW (ref 3.87–5.11)
RDW: 12.2 % (ref 11.5–15.5)
RDW: 12.4 % (ref 11.5–15.5)
WBC: 11.7 10*3/uL — ABNORMAL HIGH (ref 4.0–10.5)
WBC: 13.6 10*3/uL — ABNORMAL HIGH (ref 4.0–10.5)
nRBC: 0 % (ref 0.0–0.2)
nRBC: 0 % (ref 0.0–0.2)

## 2017-12-13 LAB — TROPONIN I: Troponin I: <0.03 ng/mL (ref <0.03)

## 2017-12-13 LAB — DIFFERENTIAL
Abs Immature Granulocytes: 0.07 10*3/uL (ref 0.00–0.07)
BASOS ABS: 0.1 10*3/uL (ref 0.0–0.1)
Basophils Relative: 0 %
EOS ABS: 0.1 10*3/uL (ref 0.0–0.5)
Eosinophils Relative: 1 %
Immature Granulocytes: 1 %
Lymphocytes Relative: 14 %
Lymphs Abs: 2 10*3/uL (ref 0.7–4.0)
Monocytes Absolute: 0.7 10*3/uL (ref 0.1–1.0)
Monocytes Relative: 5 %
Neutro Abs: 10.7 10*3/uL — ABNORMAL HIGH (ref 1.7–7.7)
Neutrophils Relative %: 79 %

## 2017-12-13 LAB — HIV ANTIBODY (ROUTINE TESTING W REFLEX): HIV Screen 4th Generation wRfx: NONREACTIVE

## 2017-12-13 NOTE — Telephone Encounter (Signed)
Call to patient to see about scheduling her for a follow up appointment with Dr Genevive Bi after her [place]. Message left.

## 2017-12-13 NOTE — Progress Notes (Signed)
   CHIEF COMPLAINT:   Follow up PTX   Subjective  Alert and awake No SOB, PTX resolved Dr Genevive Bi at bedside chest tube removed without issues No pain  OK to d/c home today  Will set up follow up with me in office next 4-6 weeks with follow up CT chest  Patient has requested that she wants to follow up with me in office     Objective   VITALS: BP 109/66 (BP Location: Right Arm)   Pulse 86   Temp 98.3 F (36.8 C) (Oral)   Resp 18   Ht 5\' 1"  (1.549 m)   Wt 72.5 kg   SpO2 99%   BMI 30.21 kg/m    Review of Systems:  Gen:  Denies  fever, sweats, chills weigh loss  HEENT: Denies blurred vision, double vision, ear pain, eye pain, hearing loss, nose bleeds, sore throat Cardiac:  No dizziness, chest pain or heaviness, chest tightness,edema, No JVD Resp:   +-shortness of breath,-wheezing, -hemoptysis,  Gi: Denies swallowing difficulty, stomach pain, nausea or vomiting, diarrhea, constipation, bowel incontinence Gu:  Denies bladder incontinence, burning urine Ext:   Denies Joint pain, stiffness or swelling Skin: Denies  skin rash, easy bruising or bleeding or hives Endoc:  Denies polyuria, polydipsia , polyphagia or weight change Psych:   Denies depression, insomnia or hallucinations  Other:  All other systems negative  Physical Examination:   GENERAL:NAD, no fevers, chills, no weakness no fatigue HEAD: Normocephalic, atraumatic.  EYES: Pupils equal, round, reactive to light. Extraocular muscles intact. No scleral icterus.  MOUTH: Moist mucosal membrane. Dentition intact. No abscess noted.  EAR, NOSE, THROAT: Clear without exudates. No external lesions.  NECK: Supple. No thyromegaly. No nodules. No JVD.  PULMONARY: CTA B/L no wheezing, rhonchi, crackles CARDIOVASCULAR: S1 and S2. Regular rate and rhythm. No murmurs, rubs, or gallops. No edema. Pedal pulses 2+ bilaterally.  GASTROINTESTINAL: Soft, nontender, nondistended. No masses. Positive bowel sounds. No  hepatosplenomegaly.  MUSCULOSKELETAL: No swelling, clubbing, or edema. Range of motion full in all extremities.  NEUROLOGIC: Cranial nerves II through XII are intact. No gross focal neurological deficits. Sensation intact. Reflexes intact.  SKIN: No ulceration, lesions, rashes, or cyanosis. Skin warm and dry. Turgor intact.  PSYCHIATRIC: Mood, affect within normal limits. The patient is awake, alert and oriented x 3. Insight, judgment intact.  ALL OTHER ROS ARE NEGATIVE   I personally reviewed Labs under Results section.   A/plan  57 yo AAF with left lung nodule s/p ENB complicated by left sided PTX s/p chest tube placement  VS stable, no acute distress, successful removal of chest tube  PTX resolved Chest tube removed  Follow up with me in 6 weeks with Follow up CT chest   Patient  satisfied with Plan of action and management. All questions answered  Corrin Parker, M.D.  [facility]  Medical Director [place] Director [facility] Cardio-Pulmonary Department

## 2017-12-13 NOTE — Progress Notes (Signed)
  Patient ID: Evadene Wardrip, female   DOB: [birth year], 57 y.o.   MRN: [id number]  HISTORY: I was asked to see Mrs. Dattilio today by Dr. Patricia Pesa.  She underwent a bronchoscopy and biopsy yesterday and had a left-sided pneumothorax which was treated with a chest tube.  As per Dr. Mortimer Fries, the chest tube did not demonstrate an air leak after insertion.  The patient states that she feels much better today and is not short of breath.  She has some discomfort at the chest tube insertion site.   Vitals:   12/12/17 2003 12/13/17 0526  BP: 125/80 109/66  Pulse: 98 86  Resp: 18 18  Temp: 98.4 F (36.9 C) 98.3 F (36.8 C)  SpO2: 100% 99%     EXAM:    Resp: Lungs are clear bilaterally.  No respiratory distress, normal effort. Heart:  Regular without murmurs Abd:  Abdomen is soft, non distended and non tender. No masses are palpable.  There is no rebound and no guarding.  Neurological: Alert and oriented to person, place, and time. Coordination normal.  Skin: Skin is warm and dry. No rash noted. No diaphoretic. No erythema. No pallor.  Psychiatric: Normal mood and affect. Normal behavior. Judgment and thought content normal.    There is no air leak with vigorous cough.  Independent review of CXRay from this morning shows no obvious pneumothorax.    ASSESSMENT: Pneumothorax status post lung biopsy   PLAN:   I will place the tube to waterseal.  We will repeat the chest x-ray later today.  If there is no evidence of pneumothorax or air leak we will remove the tube.    Nestor Lewandowsky, MDPatient ID: Vladimir Faster, female   DOB: [birth year], 57 y.o.   MRN: [id number]

## 2017-12-13 NOTE — Telephone Encounter (Signed)
-----   Message from Dominga Ferry, [place] sent at 12/12/2017  2:40 PM EST ----- It looks like patient had ENB with Dr. Mortimer Fries today. Can you call her on Tuesday to confirm and make an appointment around one week after with Dr. Genevive Bi? Thanks.

## 2017-12-13 NOTE — Care Management Note (Signed)
Case Management Note  Patient Details  Name: Falan Hensler MRN: [id number] Date of Birth: 1960/12/17  Subjective/Objective:   Patient is from home alone; son is staying with her until January.  She was admitted with a pneumothorax after a lung biopsy.  Currently has a chest tube in place.  Current with PCP.  Denies difficulties obtaining medications or with medical care.  Is independent at home in all adl's.  She uses [facility] on [address] [place].  Son will transport to home when discharged.  No further needs identified at this time.                    Action/Plan:   Expected Discharge Date:  12/14/17               Expected Discharge Plan:  Home/Self Care  In-House Referral:     Discharge planning Services  CM Consult  Post Acute Care Choice:    Choice offered to:     DME Arranged:    DME Agency:     HH Arranged:    HH Agency:     Status of Service:  Completed, signed off  If discussed at [employer] of Stay Meetings, dates discussed:    Additional Comments:  Elza Rafter, RN 12/13/2017, 2:33 PM

## 2017-12-13 NOTE — Progress Notes (Signed)
[place] [facility] [place] NAME: Carmen Grimes    MR#:  [id number]  DATE OF BIRTH:  08-31-1960  SUBJECTIVE:   feels better this am no SOB  scdared about removing the chest tube (pain issues)  REVIEW OF SYSTEMS:    Review of Systems  Constitutional: Negative for fever, chills weight loss HENT: Negative for ear pain, nosebleeds, congestion, facial swelling, rhinorrhea, neck pain, neck stiffness and ear discharge.   Respiratory: Negative for cough, shortness of breath, wheezing  Cardiovascular: Negative for chest pain, palpitations and leg swelling.  Gastrointestinal: Negative for heartburn, abdominal pain, vomiting, diarrhea or consitpation Genitourinary: Negative for dysuria, urgency, frequency, hematuria Musculoskeletal: Negative for back pain or joint pain Neurological: Negative for dizziness, seizures, syncope, focal weakness,  numbness and headaches.  Hematological: Does not bruise/bleed easily.  Psychiatric/Behavioral: Negative for hallucinations, confusion, dysphoric mood    Tolerating Diet: yes      DRUG ALLERGIES:   Allergies  Allergen Reactions  . Shellfish Allergy Anaphylaxis    Other reaction(s): anaphylaxis/angioedema Other reaction(s): anaphylaxis/angioedema  Betadine on skin is okay  . Tramadol Other (See Comments)    "blacked out", found wandering in the street  . Ascorbic Acid Hives and Nausea And Vomiting    HIVES / VOMITING   . Celecoxib Other (See Comments)    GI bleed Other reaction(s): gi distress, Other (comments), Other (See Comments) GI BLEED       . Codeine Itching and Nausea Only    Other reaction(s): mild rash/itching  . Gabapentin Other (See Comments)    Drowsiness. Like a zombie, not functional   . Hydrocodone Other (See Comments)    Hair falls out.  Marland Kitchen Hydrocodone-Acetaminophen     Other reaction(s): other/intolerance HAIR FALLS OUT   . Ibuprofen Other (See Comments)    "Blood  disorder", GI bleed. Other reaction(s): gi distress, Other (comments), Other (See Comments)   . Naproxen Other (See Comments)    GI bleed Other reaction(s): Other (comments), Other (See Comments)        . Prednisone Other (See Comments)    Tremors, severe  . Singulair [Montelukast] Other (See Comments)    Syncope..passed out  . Strawberry Extract Hives, Nausea And Vomiting and Nausea Only    Severe vomitting  . Doxycycline Other (See Comments)    Unknown reaction. Other reaction(s): Other (See Comments), Unknown, Unknown (comments) Pt does not remember   . Eggs Or Egg-Derived Products Rash and Other (See Comments)    rash  . Latex Rash and Itching    Other reaction(s): mild rash/itching  . Nsaids Nausea And Vomiting and Other (See Comments)    Gastric bleed        . Strawberry Flavor Hives  . Sulfamethoxazole-Trimethoprim Other (See Comments)    Other reaction(s): Other (See Comments), other/intolerance MAKES PAIN WORSE  Increases pain Increases pain     VITALS:  Blood pressure 109/66, pulse 86, temperature 98.3 F (36.8 C), temperature source Oral, resp. rate 18, height 5\' 1"  (1.549 m), weight 72.5 kg, SpO2 99 %.  PHYSICAL EXAMINATION:  Constitutional: Appears well-developed and well-nourished. No distress. HENT: Normocephalic. Marland Kitchen Oropharynx is clear and moist.  Eyes: Conjunctivae and EOM are normal. PERRLA, no scleral icterus.  Neck: Normal ROM. Neck supple. No JVD. No tracheal deviation. CVS: RRR, S1/S2 +, no murmurs, no gallops, no carotid bruit.  Pulmonary: Effort and breath sounds normal, no stridor, rhonchi, wheezes, rales.  chest tube placed  Abdominal: Soft. BS +,  no distension, tenderness, rebound or guarding.  Musculoskeletal: Normal range of motion. No edema and no tenderness.  Neuro: Alert. CN 2-12 grossly intact. No focal deficits. Skin: Skin is warm and dry. No rash noted. Psychiatric: Normal mood and affect.      LABORATORY PANEL:    CBC Recent Labs  Lab 12/13/17 0612  WBC 11.7*  HGB 11.2*  HCT 35.9*  PLT 346   ------------------------------------------------------------------------------------------------------------------  Chemistries  Recent Labs  Lab 12/12/17 1833  NA 136  K 4.2  CL 105  CO2 24  GLUCOSE 94  BUN 7  CREATININE 0.73  CALCIUM 8.5*   ------------------------------------------------------------------------------------------------------------------  Cardiac Enzymes Recent Labs  Lab 12/12/17 1833 12/13/17 0032 12/13/17 0612  TROPONINI <0.03 <0.03 <0.03   ------------------------------------------------------------------------------------------------------------------  RADIOLOGY:  Dg Chest Port 1 View  Result Date: 12/13/2017 CLINICAL DATA:  The patient developed a pneumothorax after biopsy of a pulmonary nodule 12/12/2017. Left chest tube in place. EXAM: PORTABLE CHEST 1 VIEW COMPARISON:  Single-view of the chest 12/12/2017. FINDINGS: Left chest tube has been pulled back with the tip now projecting over the diaphysis of the left clavicle. Trace left pneumothorax is unchanged. Small left effusion basilar atelectasis noted. The right lung is expanded and clear. Heart size is normal. Aortic atherosclerosis noted. IMPRESSION: The tip of the patient's left chest tube now projects over the diaphysis of the left clavicle. Trace left pneumothorax is unchanged. Small left effusion and basilar atelectasis. Electronically Signed   By: Inge Rise M.D.   On: 12/13/2017 09:04   Dg Chest Port 1 View  Result Date: 12/12/2017 CLINICAL DATA:  57 y/o  F; shortness of breath. EXAM: PORTABLE CHEST 1 VIEW COMPARISON:  12/12/2017 chest radiograph FINDINGS: Stable normal cardiac silhouette given projection and technique. Left-sided chest tube. Trace residual subpulmonic pneumothorax. Small left pleural effusion. No acute osseous abnormality identified. IMPRESSION: Trace residual left subpulmonic  pneumothorax, chest tube in situ. Small left pleural effusion. Electronically Signed   By: Kristine Garbe M.D.   On: 12/12/2017 20:14   Dg Chest Port 1 View  Result Date: 12/12/2017 CLINICAL DATA:  Shortness of breath post bronchoscopy EXAM: PORTABLE CHEST 1 VIEW COMPARISON:  CT 12/09/2017 FINDINGS: Right lung is clear. Moderate to large left pneumothorax with approximately 5.3 cm pleural-parenchymal separation at the left apex. Pneumothorax is visible medially and at the left lateral lung base. Atelectasis of the left lung with increased opacity at the left base. No midline shift. Normal heart size. Surgical hardware in the cervical spine. IMPRESSION: Moderate to large left pneumothorax without midline shift. Critical Value/emergent results were called by telephone at the time of interpretation on 12/12/2017 at 5:24 pm to Dr. Flora Lipps , who verbally acknowledged these results . Electronically Signed   By: Donavan Foil M.D.   On: 12/12/2017 17:25   Dg C-arm 1-60 Min-no Report  Result Date: 12/12/2017 Fluoroscopy was utilized by the requesting physician.  No radiographic interpretation.     ASSESSMENT AND PLAN:    57 year old female with history of well-controlled asthma who presented for routine lung biopsy and subsequently developed large left pneumothorax.  1.  Acute left pneumothorax status post lung biopsy: Pneumothorax is resolving with chest tube Plan to repeat chest x-ray and possibly remove chest tube this afternoon  2.  Mild intermittent asthma without signs of exacerbation       Management plans discussed with the patient and she is in agreement.  CODE STATUS: full  TOTAL TIME TAKING CARE OF THIS PATIENT: 29  minutes.     POSSIBLE D/C tomorrow, DEPENDING ON CLINICAL CONDITION.   Nehemiah Montee M.D on 12/13/2017 at 12:33 PM  Between 7am to 6pm - Pager - [phone] After 6pm go to [email or web address] - password [email or web address] [facility]  Office   [phone]  CC: Primary care physician; Perrin Maltese, MD  Note: This dictation was prepared with Dragon dictation along with smaller phrase technology. Any transcriptional errors that result from this process are unintentional.

## 2017-12-14 NOTE — Plan of Care (Signed)
  Problem: Clinical Measurements: Goal: Diagnostic test results will improve Outcome: Progressing Goal: Respiratory complications will improve Outcome: Progressing Note:  Chest tube removed yesterday, on room air, dressing from where chest tube was clean dry & intact   Problem: Activity: Goal: Risk for activity intolerance will decrease Outcome: Progressing Note:  Up independently   Problem: Pain Managment: Goal: General experience of comfort will improve Outcome: Progressing Note:  Complained of pain in left chest down left arm, from where chest tube was, treated once with oxycodone which gave relief.   Problem: Safety: Goal: Ability to remain free from injury will improve Outcome: Progressing   Problem: Skin Integrity: Goal: Risk for impaired skin integrity will decrease Outcome: Progressing   Problem: Education: Goal: Knowledge of General Education information will improve Description Including pain rating scale, medication(s)/side effects and non-pharmacologic comfort measures Outcome: Completed/Met   Problem: Nutrition: Goal: Adequate nutrition will be maintained Outcome: Completed/Met   Problem: Coping: Goal: Level of anxiety will decrease Outcome: Completed/Met   Problem: Elimination: Goal: Will not experience complications related to urinary retention Outcome: Completed/Met

## 2017-12-14 NOTE — Discharge Summary (Signed)
[facility] NAME: Carmen Grimes    MR#:  [id number]  DATE OF BIRTH:  11/25/1960  DATE OF ADMISSION:  12/12/2017 ADMITTING PHYSICIAN: Flora Lipps, MD  DATE OF DISCHARGE: 12/14/2017  PRIMARY CARE PHYSICIAN: Perrin Maltese, MD    ADMISSION DIAGNOSIS:  LEFT LUNG MASS  DISCHARGE DIAGNOSIS:  Active Problems:   Lung nodule   Pneumothorax   H/O chest tube placement   SECONDARY DIAGNOSIS:   Past Medical History:  Diagnosis Date  . Asthma    controlled  . Dyspnea 12/2017   difficulty getting her breath at all times  . GERD (gastroesophageal reflux disease)   . H/O: GI bleed   . Osteoarthritis    knees    HOSPITAL COURSE:   57 year old female with history of well-controlled asthma who presented for routine lung biopsy and subsequently developed large left pneumothorax.  1.  Acute left pneumothorax status post lung biopsy: She had a chest tube placed and the pneumothorax resolved so chest tube was discontinued. She will follow-up with Dr. Mortimer Fries for results of the lung biopsy.   2.  Mild intermittent asthma without signs of exacerbation   DISCHARGE CONDITIONS AND DIET:   Will for discharge on regular diet  CONSULTS OBTAINED:    DRUG ALLERGIES:   Allergies  Allergen Reactions  . Shellfish Allergy Anaphylaxis    Other reaction(s): anaphylaxis/angioedema Other reaction(s): anaphylaxis/angioedema  Betadine on skin is okay  . Tramadol Other (See Comments)    "blacked out", found wandering in the street  . Ascorbic Acid Hives and Nausea And Vomiting    HIVES / VOMITING   . Celecoxib Other (See Comments)    GI bleed Other reaction(s): gi distress, Other (comments), Other (See Comments) GI BLEED       . Codeine Itching and Nausea Only    Other reaction(s): mild rash/itching  . Gabapentin Other (See Comments)    Drowsiness. Like a zombie, not functional   . Hydrocodone Other (See Comments)    Hair falls  out.  Marland Kitchen Hydrocodone-Acetaminophen     Other reaction(s): other/intolerance HAIR FALLS OUT   . Ibuprofen Other (See Comments)    "Blood disorder", GI bleed. Other reaction(s): gi distress, Other (comments), Other (See Comments)   . Naproxen Other (See Comments)    GI bleed Other reaction(s): Other (comments), Other (See Comments)        . Prednisone Other (See Comments)    Tremors, severe  . Singulair [Montelukast] Other (See Comments)    Syncope..passed out  . Strawberry Extract Hives, Nausea And Vomiting and Nausea Only    Severe vomitting  . Doxycycline Other (See Comments)    Unknown reaction. Other reaction(s): Other (See Comments), Unknown, Unknown (comments) Pt does not remember   . Eggs Or Egg-Derived Products Rash and Other (See Comments)    rash  . Latex Rash and Itching    Other reaction(s): mild rash/itching  . Nsaids Nausea And Vomiting and Other (See Comments)    Gastric bleed        . Strawberry Flavor Hives  . Sulfamethoxazole-Trimethoprim Other (See Comments)    Other reaction(s): Other (See Comments), other/intolerance MAKES PAIN WORSE  Increases pain Increases pain     DISCHARGE MEDICATIONS:   Allergies as of 12/14/2017      Reactions   Shellfish Allergy Anaphylaxis   Other reaction(s): anaphylaxis/angioedema Other reaction(s): anaphylaxis/angioedema Betadine on skin is okay   Tramadol Other (See Comments)   "blacked  out", found wandering in the street   Ascorbic Acid Hives, Nausea And Vomiting   HIVES / VOMITING   Celecoxib Other (See Comments)   GI bleed Other reaction(s): gi distress, Other (comments), Other (See Comments) GI BLEED   Codeine Itching, Nausea Only   Other reaction(s): mild rash/itching   Gabapentin Other (See Comments)   Drowsiness. Like a zombie, not functional   Hydrocodone Other (See Comments)   Hair falls out.   Hydrocodone-acetaminophen    Other reaction(s): other/intolerance HAIR FALLS OUT    Ibuprofen Other (See Comments)   "Blood disorder", GI bleed. Other reaction(s): gi distress, Other (comments), Other (See Comments)   Naproxen Other (See Comments)   GI bleed Other reaction(s): Other (comments), Other (See Comments)   Prednisone Other (See Comments)   Tremors, severe   Singulair [montelukast] Other (See Comments)   Syncope..passed out   Strawberry Extract Hives, Nausea And Vomiting, Nausea Only   Severe vomitting   Doxycycline Other (See Comments)   Unknown reaction. Other reaction(s): Other (See Comments), Unknown, Unknown (comments) Pt does not remember   Eggs Or Egg-derived Products Rash, Other (See Comments)   rash   Latex Rash, Itching   Other reaction(s): mild rash/itching   Nsaids Nausea And Vomiting, Other (See Comments)   Gastric bleed   Strawberry Flavor Hives   Sulfamethoxazole-trimethoprim Other (See Comments)   Other reaction(s): Other (See Comments), other/intolerance MAKES PAIN WORSE Increases pain Increases pain      Medication List    TAKE these medications   Biotin 10000 MCG Tabs Take 1 tablet by mouth daily.   diclofenac sodium 1 % Gel Commonly known as:  VOLTAREN APPLY 2 G TOPICALLY 3 (THREE) TIMES DAILY AS NEEDED   estradiol 2 MG tablet Commonly known as:  ESTRACE Take 1 tablet (2 mg total) by mouth daily.   GLUCOSAMINE CHOND COMPLEX/MSM PO Take 1 capsule by mouth daily.   SYMBICORT 80-4.5 MCG/ACT inhaler Generic drug:  budesonide-formoterol Inhale 2 puffs into the lungs 2 (two) times daily.   VENTOLIN HFA 108 (90 Base) MCG/ACT inhaler Generic drug:  albuterol Inhale 2 puffs into the lungs every 6 (six) hours as needed.   Vitamin D3 1.25 MG (50000 UT) Caps TAKE ONE CAPSULE BY MOUTH ONE TIME PER WEEK         Today   CHIEF COMPLAINT:   Patient is doing well this morning.  Ready for discharge home today   VITAL SIGNS:  Blood pressure 126/70, pulse 70, temperature 97.6 F (36.4 C), temperature source Oral, resp.  rate 16, height 5\' 1"  (1.549 m), weight 72 kg, SpO2 100 %.   REVIEW OF SYSTEMS:  Review of Systems  Constitutional: Negative.  Negative for chills, fever and malaise/fatigue.  HENT: Negative.  Negative for ear discharge, ear pain, hearing loss, nosebleeds and sore throat.   Eyes: Negative.  Negative for blurred vision and pain.  Respiratory: Negative.  Negative for cough, hemoptysis, shortness of breath and wheezing.   Cardiovascular: Negative.  Negative for chest pain, palpitations and leg swelling.  Gastrointestinal: Negative.  Negative for abdominal pain, blood in stool, diarrhea, nausea and vomiting.  Genitourinary: Negative.  Negative for dysuria.  Musculoskeletal: Negative.  Negative for back pain.  Skin: Negative.   Neurological: Negative for dizziness, tremors, speech change, focal weakness, seizures and headaches.  Endo/Heme/Allergies: Negative.  Does not bruise/bleed easily.  Psychiatric/Behavioral: Negative.  Negative for depression, hallucinations and suicidal ideas.     PHYSICAL EXAMINATION:  GENERAL:  57 y.o.-year-old patient  lying in the bed with no acute distress.  NECK:  Supple, no jugular venous distention. No thyroid enlargement, no tenderness.  LUNGS: Normal breath sounds bilaterally, no wheezing, rales,rhonchi  No use of accessory muscles of respiration.  CARDIOVASCULAR: S1, S2 normal. No murmurs, rubs, or gallops.  ABDOMEN: Soft, non-tender, non-distended. Bowel sounds present. No organomegaly or mass.  EXTREMITIES: No pedal edema, cyanosis, or clubbing.  PSYCHIATRIC: The patient is alert and oriented x 3.  SKIN: No obvious rash, lesion, or ulcer.   DATA REVIEW:   CBC Recent Labs  Lab 12/13/17 0612  WBC 11.7*  HGB 11.2*  HCT 35.9*  PLT 346    Chemistries  Recent Labs  Lab 12/12/17 1833  NA 136  K 4.2  CL 105  CO2 24  GLUCOSE 94  BUN 7  CREATININE 0.73  CALCIUM 8.5*    Cardiac Enzymes Recent Labs  Lab 12/12/17 1833 12/13/17 0032  12/13/17 0612  TROPONINI <0.03 <0.03 <0.03    Microbiology Results  @[email or web address] @  RADIOLOGY:  Dg Chest 2 View  Result Date: 12/13/2017 CLINICAL DATA:  57 year old female with a left-sided pigtail thoracostomy tube in place for pneumothorax. EXAM: CHEST - 2 VIEW COMPARISON:  Prior chest x-ray obtained earlier today FINDINGS: Left pigtail thoracostomy tube remains in stable position. No discernible pneumothorax. Persistent mild left basilar atelectasis. The right lung remains clear. The cardiac and mediastinal contours are within normal limits. No acute osseous abnormality. IMPRESSION: No definitive pneumothorax identified. Chest tube remains in place. Electronically Signed   By: Jacqulynn Cadet M.D.   On: 12/13/2017 14:14   Dg Chest Grimes 1 View  Result Date: 12/13/2017 CLINICAL DATA:  The patient developed a pneumothorax after biopsy of a pulmonary nodule 12/12/2017. Left chest tube in place. EXAM: PORTABLE CHEST 1 VIEW COMPARISON:  Single-view of the chest 12/12/2017. FINDINGS: Left chest tube has been pulled back with the tip now projecting over the diaphysis of the left clavicle. Trace left pneumothorax is unchanged. Small left effusion basilar atelectasis noted. The right lung is expanded and clear. Heart size is normal. Aortic atherosclerosis noted. IMPRESSION: The tip of the patient's left chest tube now projects over the diaphysis of the left clavicle. Trace left pneumothorax is unchanged. Small left effusion and basilar atelectasis. Electronically Signed   By: Inge Rise M.D.   On: 12/13/2017 09:04   Dg Chest Grimes 1 View  Result Date: 12/12/2017 CLINICAL DATA:  57 y/o  F; shortness of breath. EXAM: PORTABLE CHEST 1 VIEW COMPARISON:  12/12/2017 chest radiograph FINDINGS: Stable normal cardiac silhouette given projection and technique. Left-sided chest tube. Trace residual subpulmonic pneumothorax. Small left pleural effusion. No acute osseous abnormality identified. IMPRESSION:  Trace residual left subpulmonic pneumothorax, chest tube in situ. Small left pleural effusion. Electronically Signed   By: Kristine Garbe M.D.   On: 12/12/2017 20:14   Dg Chest Grimes 1 View  Result Date: 12/12/2017 CLINICAL DATA:  Shortness of breath post bronchoscopy EXAM: PORTABLE CHEST 1 VIEW COMPARISON:  CT 12/09/2017 FINDINGS: Right lung is clear. Moderate to large left pneumothorax with approximately 5.3 cm pleural-parenchymal separation at the left apex. Pneumothorax is visible medially and at the left lateral lung base. Atelectasis of the left lung with increased opacity at the left base. No midline shift. Normal heart size. Surgical hardware in the cervical spine. IMPRESSION: Moderate to large left pneumothorax without midline shift. Critical Value/emergent results were called by telephone at the time of interpretation on 12/12/2017 at 5:24 pm to Dr. Maretta Bees  KASA , who verbally acknowledged these results . Electronically Signed   By: Donavan Foil M.D.   On: 12/12/2017 17:25   Dg C-arm 1-60 Min-no Report  Result Date: 12/12/2017 Fluoroscopy was utilized by the requesting physician.  No radiographic interpretation.      Allergies as of 12/14/2017      Reactions   Shellfish Allergy Anaphylaxis   Other reaction(s): anaphylaxis/angioedema Other reaction(s): anaphylaxis/angioedema Betadine on skin is okay   Tramadol Other (See Comments)   "blacked out", found wandering in the street   Ascorbic Acid Hives, Nausea And Vomiting   HIVES / VOMITING   Celecoxib Other (See Comments)   GI bleed Other reaction(s): gi distress, Other (comments), Other (See Comments) GI BLEED   Codeine Itching, Nausea Only   Other reaction(s): mild rash/itching   Gabapentin Other (See Comments)   Drowsiness. Like a zombie, not functional   Hydrocodone Other (See Comments)   Hair falls out.   Hydrocodone-acetaminophen    Other reaction(s): other/intolerance HAIR FALLS OUT   Ibuprofen Other (See  Comments)   "Blood disorder", GI bleed. Other reaction(s): gi distress, Other (comments), Other (See Comments)   Naproxen Other (See Comments)   GI bleed Other reaction(s): Other (comments), Other (See Comments)   Prednisone Other (See Comments)   Tremors, severe   Singulair [montelukast] Other (See Comments)   Syncope..passed out   Strawberry Extract Hives, Nausea And Vomiting, Nausea Only   Severe vomitting   Doxycycline Other (See Comments)   Unknown reaction. Other reaction(s): Other (See Comments), Unknown, Unknown (comments) Pt does not remember   Eggs Or Egg-derived Products Rash, Other (See Comments)   rash   Latex Rash, Itching   Other reaction(s): mild rash/itching   Nsaids Nausea And Vomiting, Other (See Comments)   Gastric bleed   Strawberry Flavor Hives   Sulfamethoxazole-trimethoprim Other (See Comments)   Other reaction(s): Other (See Comments), other/intolerance MAKES PAIN WORSE Increases pain Increases pain      Medication List    TAKE these medications   Biotin 10000 MCG Tabs Take 1 tablet by mouth daily.   diclofenac sodium 1 % Gel Commonly known as:  VOLTAREN APPLY 2 G TOPICALLY 3 (THREE) TIMES DAILY AS NEEDED   estradiol 2 MG tablet Commonly known as:  ESTRACE Take 1 tablet (2 mg total) by mouth daily.   GLUCOSAMINE CHOND COMPLEX/MSM PO Take 1 capsule by mouth daily.   SYMBICORT 80-4.5 MCG/ACT inhaler Generic drug:  budesonide-formoterol Inhale 2 puffs into the lungs 2 (two) times daily.   VENTOLIN HFA 108 (90 Base) MCG/ACT inhaler Generic drug:  albuterol Inhale 2 puffs into the lungs every 6 (six) hours as needed.   Vitamin D3 1.25 MG (50000 UT) Caps TAKE ONE CAPSULE BY MOUTH ONE TIME PER WEEK          Management plans discussed with the patient and she is in agreement. Stable for discharge home  Patient should follow up with pcp  CODE STATUS:     Code Status Orders  (From admission, onward)         Start     Ordered    12/12/17 1946  Full code  Continuous     12/12/17 1945        Code Status History    This patient has a current code status but no historical code status.    Advance Directive Documentation     Most Recent Value  Type of Advance Directive  Healthcare Power of [place], Living  will  Pre-existing out of facility DNR order (yellow form or pink MOST form)  -  "MOST" Form in Place?  -      TOTAL TIME TAKING CARE OF THIS PATIENT: 38 minutes.    Note: This dictation was prepared with Dragon dictation along with smaller phrase technology. Any transcriptional errors that result from this process are unintentional.  Orhan Mayorga M.D on 12/14/2017 at 10:07 AM  Between 7am to 6pm - Pager - [phone] After 6pm go to [email or web address] - password [email or web address] [facility]  Office  [phone]  CC: Primary care physician; Perrin Maltese, MD

## 2017-12-16 ENCOUNTER — Telehealth: Payer: Self-pay | Admitting: Internal Medicine

## 2017-12-16 MED ORDER — HYDROCOD POLST-CPM POLST ER 10-8 MG/5ML PO SUER: 5.0000 mL | Freq: Two times a day (BID) | ORAL | 0 refills | Status: DC | PRN

## 2017-12-16 NOTE — Telephone Encounter (Signed)
Please call to discuss lung biopsy results.

## 2017-12-16 NOTE — Telephone Encounter (Signed)
Discussed results with patient\ Prescribed tussionex for cough  Follow up CT chest in 8 weeks

## 2017-12-16 NOTE — Telephone Encounter (Signed)
Forwarding to MD for results.

## 2017-12-19 ENCOUNTER — Telehealth: Payer: Self-pay

## 2017-12-19 DIAGNOSIS — R918 Other nonspecific abnormal finding of lung field: Secondary | ICD-10-CM

## 2017-12-19 NOTE — Telephone Encounter (Signed)
Called and spoke with patient that Dr. Mortimer Fries would like the patient to f/u in 6 weeks with chest CT prior. Scheduled apt, order entered for CT.

## 2017-12-25 ENCOUNTER — Ambulatory Visit
Admission: RE | Admit: 2017-12-25 | Discharge: 2017-12-25 | Disposition: A | Discharge status: Home / Self Care | Payer: Medicare Other | Source: Ambulatory Visit | Attending: Otolaryngology | Admitting: Otolaryngology

## 2017-12-25 DIAGNOSIS — H9313 Tinnitus, bilateral: Secondary | ICD-10-CM | POA: Insufficient documentation

## 2017-12-25 DIAGNOSIS — H9193 Unspecified hearing loss, bilateral: Secondary | ICD-10-CM | POA: Diagnosis not present

## 2017-12-25 DIAGNOSIS — R9089 Other abnormal findings on diagnostic imaging of central nervous system: Secondary | ICD-10-CM | POA: Insufficient documentation

## 2017-12-25 MED ORDER — GADOBUTROL 1 MMOL/ML IV SOLN
7.0000 mL | Freq: Once | INTRAVENOUS | Status: AC | PRN
  Administered 2017-12-25: 7 mL via INTRAVENOUS

## 2017-12-26 ENCOUNTER — Ambulatory Visit (INDEPENDENT_AMBULATORY_CARE_PROVIDER_SITE_OTHER): Payer: Medicare Other | Admitting: Cardiothoracic Surgery

## 2017-12-26 ENCOUNTER — Encounter: Payer: Self-pay | Admitting: Cardiothoracic Surgery

## 2017-12-26 ENCOUNTER — Telehealth: Payer: Self-pay

## 2017-12-26 ENCOUNTER — Other Ambulatory Visit: Payer: Self-pay

## 2017-12-26 ENCOUNTER — Ambulatory Visit
Admission: RE | Admit: 2017-12-26 | Discharge: 2017-12-26 | Disposition: A | Discharge status: Home / Self Care | Payer: Medicare Other | Attending: Cardiothoracic Surgery | Admitting: Cardiothoracic Surgery

## 2017-12-26 ENCOUNTER — Ambulatory Visit
Admission: RE | Admit: 2017-12-26 | Discharge: 2017-12-26 | Disposition: A | Discharge status: Home / Self Care | Payer: Medicare Other | Source: Ambulatory Visit | Attending: Cardiothoracic Surgery | Admitting: Cardiothoracic Surgery

## 2017-12-26 VITALS — BP 127/79 | HR 67 | Temp 97.3°F | Resp 16 | Ht 61.0 in | Wt 162.2 lb

## 2017-12-26 DIAGNOSIS — R918 Other nonspecific abnormal finding of lung field: Secondary | ICD-10-CM

## 2017-12-26 NOTE — Progress Notes (Signed)
  Patient ID: Carmen Grimes, female   DOB: 1960-01-21, 57 y.o.   MRN: [id number]  HISTORY: She returns today in follow-up.  She did have a pneumothorax after her bronchoscopy.  She states she feels pretty well overall she does have occasional episodes of shortness of breath but this has not been severe.  She states that there is been no significant change in her overall pulmonary status over the last couple weeks.   Vitals:   12/26/17 1007  BP: 127/79  Pulse: 67  Resp: 16  Temp: (!) 97.3 F (36.3 C)  SpO2: 98%     EXAM:    Resp: Lungs are clear bilaterally.  No respiratory distress, normal effort. Heart:  Regular without murmurs Abd:  Abdomen is soft, non distended and non tender. No masses are palpable.  There is no rebound and no guarding.  Neurological: Alert and oriented to person, place, and time. Coordination normal.  Skin: Skin is warm and dry. No rash noted. No diaphoretic. No erythema. No pallor.  Psychiatric: Normal mood and affect. Normal behavior. Judgment and thought content normal.    ASSESSMENT: She does have a left lung nodule and an attempt at bronchoscopy revealed no evidence of malignancy.  She did have a pneumothorax which was treated with chest tube insertion.  Today her lung exam is normal.  However I would like to obtain a chest x-ray just in follow-up of her pneumothorax.  We will go ahead and order that now.   PLAN:   We will obtain a chest x-ray today.  We will contact her with those results.  She will have her CT scan in January with Dr. Woody Seller and she will follow-up with him as needed.  I did not make return visit for today but I will be speaking with Dr. Christel Mormon once the neck scan is obtained.    Nestor Lewandowsky, MD

## 2017-12-26 NOTE — Telephone Encounter (Signed)
Patient notified of chest xray results per Dr.Oaks

## 2017-12-26 NOTE — Patient Instructions (Signed)
Please go to Out Patient imaging to have a chest xray today.   Please call if you have any questions or concerns.

## 2018-01-07 ENCOUNTER — Emergency Department: Payer: Medicare Other

## 2018-01-07 ENCOUNTER — Other Ambulatory Visit: Payer: Self-pay

## 2018-01-07 ENCOUNTER — Emergency Department
Admission: EM | Admit: 2018-01-07 | Discharge: 2018-01-08 | Disposition: A | Discharge status: Home / Self Care | Payer: Medicare Other | Attending: Emergency Medicine | Admitting: Emergency Medicine

## 2018-01-07 ENCOUNTER — Encounter: Payer: Self-pay | Admitting: Emergency Medicine

## 2018-01-07 DIAGNOSIS — R030 Elevated blood-pressure reading, without diagnosis of hypertension: Secondary | ICD-10-CM | POA: Insufficient documentation

## 2018-01-07 DIAGNOSIS — Z91040 Latex allergy status: Secondary | ICD-10-CM | POA: Diagnosis not present

## 2018-01-07 DIAGNOSIS — J45909 Unspecified asthma, uncomplicated: Secondary | ICD-10-CM | POA: Diagnosis not present

## 2018-01-07 DIAGNOSIS — R002 Palpitations: Secondary | ICD-10-CM | POA: Insufficient documentation

## 2018-01-07 DIAGNOSIS — Z79899 Other long term (current) drug therapy: Secondary | ICD-10-CM | POA: Insufficient documentation

## 2018-01-07 DIAGNOSIS — I10 Essential (primary) hypertension: Secondary | ICD-10-CM | POA: Insufficient documentation

## 2018-01-07 DIAGNOSIS — R42 Dizziness and giddiness: Secondary | ICD-10-CM | POA: Insufficient documentation

## 2018-01-07 LAB — BASIC METABOLIC PANEL
Anion gap: 7 (ref 5–15)
BUN: 6 mg/dL (ref 6–20)
CO2: 22 mmol/L (ref 22–32)
Calcium: 9.2 mg/dL (ref 8.9–10.3)
Chloride: 106 mmol/L (ref 98–111)
Creatinine, Ser: 0.7 mg/dL (ref 0.44–1.00)
GFR calc Af Amer: >60 mL/min (ref >60)
GFR calc non Af Amer: >60 mL/min (ref >60)
GLUCOSE: 105 mg/dL — AB (ref 70–99)
Potassium: 4.2 mmol/L (ref 3.5–5.1)
Sodium: 135 mmol/L (ref 135–145)

## 2018-01-07 LAB — CBC
HCT: 38.9 % (ref 36.0–46.0)
Hemoglobin: 12.6 g/dL (ref 12.0–15.0)
MCH: 30.4 pg (ref 26.0–34.0)
MCHC: 32.4 g/dL (ref 30.0–36.0)
MCV: 93.7 fL (ref 80.0–100.0)
Platelets: 367 10*3/uL (ref 150–400)
RBC: 4.15 MIL/uL (ref 3.87–5.11)
RDW: 12.6 % (ref 11.5–15.5)
WBC: 10.1 10*3/uL (ref 4.0–10.5)
nRBC: 0 % (ref 0.0–0.2)

## 2018-01-07 LAB — GLUCOSE, CAPILLARY: Glucose-Capillary: 79 mg/dL (ref 70–99)

## 2018-01-07 NOTE — ED Triage Notes (Signed)
Pt arrived via POV with reports of headache and dizziness that started about 530pm. Pt states she drove her son to [employer] school in [place] today and sxs began after she returned home.  Pt stating in triage "please don't let me die"  Pt states she is a vegan and had minimal PO intake today as well. Pt is not a diabetic.  Pt states she feels like her heart is racing also.   Pt reports the dizziness and headache did go away, but states she is starting to feel dizzy again.

## 2018-01-07 NOTE — ED Notes (Signed)
Pt revitalized by this Therapist, sports. Pt states that she is feeling better at this time due to BP being lower.

## 2018-01-07 NOTE — ED Notes (Signed)
Discussed with Dr. Archie Balboa, new order for CT head given due to patients intermittent dizziness and left side headache that came and went.  Pt states she developed the left side headache last about 10 minutes around 1730.

## 2018-01-07 NOTE — ED Notes (Signed)
Patient up to desk inquiring about wait time, up date given.

## 2018-01-08 ENCOUNTER — Encounter: Payer: Self-pay | Admitting: Emergency Medicine

## 2018-01-08 ENCOUNTER — Emergency Department
Admission: EM | Admit: 2018-01-08 | Discharge: 2018-01-08 | Disposition: A | Discharge status: Home / Self Care | Payer: Medicare Other | Source: Home / Self Care | Attending: Emergency Medicine | Admitting: Emergency Medicine

## 2018-01-08 ENCOUNTER — Emergency Department: Payer: Medicare Other

## 2018-01-08 ENCOUNTER — Other Ambulatory Visit: Payer: Self-pay

## 2018-01-08 DIAGNOSIS — Z91040 Latex allergy status: Secondary | ICD-10-CM | POA: Insufficient documentation

## 2018-01-08 DIAGNOSIS — Z79899 Other long term (current) drug therapy: Secondary | ICD-10-CM

## 2018-01-08 DIAGNOSIS — R42 Dizziness and giddiness: Secondary | ICD-10-CM | POA: Diagnosis not present

## 2018-01-08 DIAGNOSIS — J45909 Unspecified asthma, uncomplicated: Secondary | ICD-10-CM | POA: Insufficient documentation

## 2018-01-08 DIAGNOSIS — I10 Essential (primary) hypertension: Secondary | ICD-10-CM | POA: Insufficient documentation

## 2018-01-08 DIAGNOSIS — R002 Palpitations: Secondary | ICD-10-CM | POA: Insufficient documentation

## 2018-01-08 LAB — BASIC METABOLIC PANEL
Anion gap: 7 (ref 5–15)
BUN: 7 mg/dL (ref 6–20)
CALCIUM: 9.6 mg/dL (ref 8.9–10.3)
CO2: 24 mmol/L (ref 22–32)
Chloride: 108 mmol/L (ref 98–111)
Creatinine, Ser: 0.75 mg/dL (ref 0.44–1.00)
GFR calc Af Amer: >60 mL/min (ref >60)
GFR calc non Af Amer: >60 mL/min (ref >60)
Glucose, Bld: 101 mg/dL — ABNORMAL HIGH (ref 70–99)
Potassium: 4.9 mmol/L (ref 3.5–5.1)
Sodium: 139 mmol/L (ref 135–145)

## 2018-01-08 LAB — URINALYSIS, COMPLETE (UACMP) WITH MICROSCOPIC
Bilirubin Urine: NEGATIVE
Glucose, UA: NEGATIVE mg/dL
HGB URINE DIPSTICK: NEGATIVE
Ketones, ur: NEGATIVE mg/dL
Leukocytes, UA: NEGATIVE
Nitrite: NEGATIVE
PROTEIN: NEGATIVE mg/dL
Specific Gravity, Urine: 1.012 (ref 1.005–1.030)
pH: 6 (ref 5.0–8.0)

## 2018-01-08 LAB — TROPONIN I: Troponin I: <0.03 ng/mL (ref <0.03)

## 2018-01-08 LAB — CBC
HCT: 37.9 % (ref 36.0–46.0)
Hemoglobin: 12.2 g/dL (ref 12.0–15.0)
MCH: 29.9 pg (ref 26.0–34.0)
MCHC: 32.2 g/dL (ref 30.0–36.0)
MCV: 92.9 fL (ref 80.0–100.0)
Platelets: 351 10*3/uL (ref 150–400)
RBC: 4.08 MIL/uL (ref 3.87–5.11)
RDW: 12.4 % (ref 11.5–15.5)
WBC: 9.6 10*3/uL (ref 4.0–10.5)
nRBC: 0 % (ref 0.0–0.2)

## 2018-01-08 LAB — TSH: TSH: 3.3 u[IU]/mL (ref 0.350–4.500)

## 2018-01-08 MED ORDER — HYDROCHLOROTHIAZIDE 25 MG PO TABS
25.0000 mg | ORAL_TABLET | Freq: Once | ORAL | Status: AC
  Administered 2018-01-08: 25 mg via ORAL
  Filled 2018-01-08: qty 1

## 2018-01-08 MED ORDER — HYDROCHLOROTHIAZIDE 12.5 MG PO TABS: 12.5000 mg | ORAL_TABLET | Freq: Every day | ORAL | 0 refills | Status: DC

## 2018-01-08 NOTE — ED Notes (Signed)
Patient verbalized understanding of discharge instructions, no questions. Patient ambulated out of ED with steady gait in no distress.  

## 2018-01-08 NOTE — ED Provider Notes (Signed)
[facility] Emergency Department Provider Note  ____________________________________________   First MD Initiated Contact with Patient 01/07/18 2356     (approximate)  I have reviewed the triage vital signs and the nursing notes.   HISTORY  Chief Complaint Hypertension and Dizziness    HPI Carmen Grimes is a 58 y.o. female with medical history as listed below who presents for evaluation of an episode with symptoms that included dizziness, palpitations, and high blood pressure.  She states that she has been under increased stress lately because her son told her yesterday that he has joined [employer] and that she needs to take him today to [place].  She drove into [place] and said that they had a good drive and she is a little bit emotional but not too upset and knows that her son needs to live his own life.  When she was back within about 15 minutes of home she developed a left-sided headache.  The symptoms increased gradually after she got home and then she started feeling little bit dizzy.  That made her concerned about her blood pressure and when she checked it with her home cuff it was elevated.  She then started feeling palpitations.  Her symptoms continued after she got to the emergency department but have since resolved.  She is not on any blood pressure medicine normally because she says she does not have a history of high blood pressure.  She states that she did feel anxious but she has not had panic attacks in the past.  She did not have chest pain or abdominal pain and did not feel short of breath.  She denies nausea and vomiting.  Her symptoms were gradual in onset over a period of time but still relatively acute and were severe but have resolved.  Nothing in particular made her symptoms better nor worse.  She has been worked up recently for a lung mass and is undergoing a work-up by pulmonology.  She also had an outpatient MRI of the brain within the last  3 weeks for tinnitus that was reassuring with no obvious acute or subacute abnormalities.   Past Medical History:  Diagnosis Date  . Asthma    controlled  . Dyspnea 12/2017   difficulty getting her breath at all times  . GERD (gastroesophageal reflux disease)   . H/O: GI bleed   . Osteoarthritis    knees    Patient Active Problem List   Diagnosis Date Noted  . H/O chest tube placement   . Pneumothorax 12/12/2017  . Lung nodule   . Degeneration of lumbar intervertebral disc 12/05/2017  . Cervical spondylosis without myelopathy 06/26/2017  . 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 Synovitis of right hand 02/01/2017  . H/O: GI bleed   . Osteoarthritis   . GERD (gastroesophageal reflux disease)   . Asthma   . Vitamin D deficiency 07/16/2016  . Anxiety 07/12/2016  . Acute pain of left shoulder 03/17/2016  . Impingement syndrome of left shoulder 03/17/2016  . Right hip pain 12/19/2015  . Primary osteoarthritis of right knee 12/05/2015  . Lichen planus [id number]/[id number]  . Chest pain 01/13/2015  . Left forearm pain 06/08/2014  . Acute bilateral low back pain with bilateral sciatica 05/16/2014  . Breast pain, left 05/16/2014  . Cervical radiculopathy 05/16/2014  . Abnormal weight gain 02/22/2014  . Cervical pain (neck) 02/22/2014  . Fatigue 02/22/2014  . Hair loss 02/22/2014  . Muscle spasm 02/22/2014  . Mild intermittent asthma  11/12/2013    Past Surgical History:  Procedure Laterality Date  . ABDOMINAL HYSTERECTOMY  2002  . ANTERIOR FUSION CERVICAL SPINE  2009  . COLONOSCOPY  2013   normal  . ELECTROMAGNETIC NAVIGATION BROCHOSCOPY Left 12/12/2017   Procedure: ELECTROMAGNETIC NAVIGATION BRONCHOSCOPY;  Surgeon: Flora Lipps, MD;  Location: [facility] ORS;  Service: Cardiopulmonary;  Laterality: Left;  . ESOPHAGOGASTRODUODENOSCOPY  1990   GI bleed  . FLEXIBLE BRONCHOSCOPY Left 12/12/2017   Procedure: FLEXIBLE BRONCHOSCOPY;  Surgeon: Flora Lipps, MD;  Location: [facility] ORS;   Service: Cardiopulmonary;  Laterality: Left;  . KNEE ARTHROSCOPY Right 2010   x3  . KNEE SURGERY Left    Arthroscopy x1  . LAPAROSCOPIC SUPRACERVICAL HYSTERECTOMY  2002   with BSO for fibroids/ AUB/ pelvic pain  . LAPAROSCOPIC TUBAL LIGATION  1982  . SHOULDER SURGERY Right    rotator cuff repair  . SHOULDER SURGERY Right 2011   second rotator cuff repair  . tubal reanastamosis  1985    Prior to Admission medications   Medication Sig Start Date End Date Taking? Authorizing Provider  Biotin 10000 MCG TABS Take 1 tablet by mouth daily.    [provider]  chlorpheniramine-HYDROcodone (TUSSIONEX PENNKINETIC ER) 10-8 MG/5ML SUER Take 5 mLs by mouth every 12 (twelve) hours as needed for cough. 12/16/17   Flora Lipps, MD  Cholecalciferol (VITAMIN D3) 50000 units CAPS TAKE ONE CAPSULE BY MOUTH ONE TIME PER WEEK 03/04/17   [provider]  diclofenac sodium (VOLTAREN) 1 % GEL APPLY 2 G TOPICALLY 3 (THREE) TIMES DAILY AS NEEDED 12/06/16   [provider]  estradiol (ESTRACE) 2 MG tablet Take 1 tablet (2 mg total) by mouth daily. 02/17/17   Dalia Heading, CNM  Misc Natural Products (GLUCOSAMINE CHOND COMPLEX/MSM PO) Take 1 capsule by mouth daily.     [provider]  ondansetron (ZOFRAN-ODT) 8 MG disintegrating tablet TAKE 1 TABLET BY MOUTH EVERY 4 TO 6 HOURS AS NEEDED 11/30/17   [provider]  SYMBICORT 80-4.5 MCG/ACT inhaler Inhale 2 puffs into the lungs 2 (two) times daily.  03/09/17   [provider]  VENTOLIN HFA 108 (90 Base) MCG/ACT inhaler Inhale 2 puffs into the lungs every 6 (six) hours as needed.  11/14/17   [provider]    Allergies Shellfish allergy; Tramadol; Ascorbic acid; Celecoxib; Codeine; Gabapentin; Hydrocodone; Hydrocodone-acetaminophen; Ibuprofen; Naproxen; Prednisone; Singulair [montelukast]; Strawberry extract; Doxycycline; Eggs or egg-derived products; Latex; Nsaids; Strawberry flavor; and  Sulfamethoxazole-trimethoprim  Family History  Problem Relation Age of Onset  . Diabetes Mother   . Dementia Mother        Alzheimers  . Hypertension Mother   . Glaucoma Mother   . Prostate cancer Father 86  . Stroke Father   . Arthritis Father   . Osteoporosis Father   . Prostate cancer Brother   . Breast cancer Neg Hx     Social History Social History   Tobacco Use  . Smoking status: Never Smoker  . Smokeless tobacco: Never Used  Substance Use Topics  . Alcohol use: No  . Drug use: No    Review of Systems Constitutional: No fever/chills Eyes: No visual changes. ENT: No sore throat. Cardiovascular: Denies chest pain.  Did feel rapid heart rate or palpitations. Respiratory: Denies shortness of breath. Gastrointestinal: No abdominal pain.  No nausea, no vomiting.  No diarrhea.  No constipation. Genitourinary: Negative for dysuria. Musculoskeletal: Negative for neck pain.  Negative for back pain. Integumentary: Negative for rash. Neurological: Negative  for headaches, focal weakness or numbness.   ____________________________________________   PHYSICAL EXAM:  VITAL SIGNS: ED Triage Vitals  Enc Vitals Group     BP 01/07/18 1851 (!) 162/100     Pulse Rate 01/07/18 1851 (!) 130     Resp 01/07/18 1851 18     Temp 01/07/18 1851 98.4 F (36.9 C)     Temp Source 01/07/18 1851 Oral     SpO2 01/07/18 1851 100 %     Weight 01/07/18 1852 73.6 kg (162 lb 3.2 oz)     Height 01/07/18 1852 1.549 m (_0 )     Head Circumference --      Peak Flow --      Pain Score 01/07/18 1851 0     Pain Loc --      Pain Edu? --      Excl. in [place]? --     Constitutional: Alert and oriented. Well appearing and in no acute distress. Eyes: Conjunctivae are normal.  Head: Atraumatic. Nose: No congestion/rhinnorhea. Mouth/Throat: Mucous membranes are moist. Neck: No stridor.  No meningeal signs.   Cardiovascular: Normal rate, regular rhythm. Good peripheral circulation. Grossly normal  heart sounds. Respiratory: Normal respiratory effort.  No retractions. Lungs CTAB. Gastrointestinal: Soft and nontender. No distention.  Musculoskeletal: No lower extremity tenderness nor edema. No gross deformities of extremities. Neurologic:  Normal speech and language. No gross focal neurologic deficits are appreciated.  Skin:  Skin is warm, dry and intact. No rash noted. Psychiatric: Mood and affect are a little bit anxious but generally reasonable and appropriate.  ____________________________________________   LABS (all labs ordered are listed, but only abnormal results are displayed)  Labs Reviewed  BASIC METABOLIC PANEL - Abnormal; Notable for the following components:      Result Value   Glucose, Bld 105 (*)    All other components within normal limits  CBC  GLUCOSE, CAPILLARY  URINALYSIS, COMPLETE (UACMP) WITH MICROSCOPIC  CBG MONITORING, ED   ____________________________________________  EKG  ED ECG REPORT I, Hinda Kehr, the attending physician, personally viewed and interpreted this ECG.  Date: 01/07/2018 EKG Time: 18: 51 Rate: 111 Rhythm: Sinus tachycardia QRS Axis: normal Intervals: normal ST/T Wave abnormalities: Non-specific ST segment / T-wave changes, but no clear evidence of acute ischemia. Narrative Interpretation: no definitive evidence of acute ischemia; does not meet STEMI criteria.  Nonspecific changes compared to last EKG but not emergent.   ____________________________________________  RADIOLOGY   ED MD interpretation:  No acute intracranial abnormalities.  Official radiology report(s): Ct Head Wo Contrast  Result Date: 01/07/2018 CLINICAL DATA:  Headache and dizziness beginning this evening. EXAM: CT HEAD WITHOUT CONTRAST TECHNIQUE: Contiguous axial images were obtained from the base of the skull through the vertex without intravenous contrast. COMPARISON:  MRI of the head December 25, 2017. FINDINGS: BRAIN: No intraparenchymal hemorrhage,  mass effect nor midline shift. Borderline parenchymal brain volume loss for age. Patchy supratentorial white matter hypodensities. No acute large vascular territory infarcts. No abnormal extra-axial fluid collections. Basal cisterns are patent. VASCULAR: Moderate calcific atherosclerosis of the carotid siphons. SKULL: No skull fracture. No significant scalp soft tissue swelling. SINUSES/ORBITS: Trace paranasal sinus mucosal thickening. Mastoid air cells are well aerated.The included ocular globes and orbital contents are non-suspicious. OTHER: None. IMPRESSION: 1. No acute intracranial process. 2. Borderline parenchymal brain volume loss for age. 3. Minimal chronic small vessel ischemic changes, better characterized on recent MRI of the head. Electronically Signed   By: Thana Farr.D.  On: 01/07/2018 19:21    ____________________________________________   PROCEDURES  Critical Care performed: No   Procedure(s) performed:   Procedures   ____________________________________________   INITIAL IMPRESSION / ASSESSMENT AND PLAN / ED COURSE  As part of my medical decision making, I reviewed the following data within the [facility] notes reviewed and incorporated, Labs reviewed , EKG interpreted , Old chart reviewed and Notes from prior ED visits    Differential diagnosis includes, but is not limited to, anxiety/panic attack, nonspecific peripheral dizziness/vertigo, electrolyte or metabolic abnormality, ACS, pulmonary embolism.  The patient's symptoms have resolved and although she was initially tachycardic when she arrived to the emergency department, she was obviously very anxious and upset and now she is calm, asymptomatic, and her heart rate is returned to normal.  I think that she had an anxiety/panic attack in the setting of her son enlisting with the [employer] and her having to unexpectedly drive him to [place] to send him off.  She acknowledges this is very  possible.  Also, the patient did have a biopsy of the mass in her lungs and she reports that it was not malignant.  This is reassuring and I think that her risk for PE is very low.  Her well score for PE, given that she has no active malignancy, is at most 1.5 if you consider her relatively short drive to [place] as an immobilization.  Regardless I do not believe that it needs additional work-up at this time and even a d-dimer would be nondiagnostic because of her age and the likelihood that it would be elevated at baseline.  The patient is very comfortable with the plan for discharge and outpatient follow-up and she already has close follow-up scheduled with pulmonology based on her ongoing work-up.  I gave my usual customary return precautions and she agrees with the plan.     ____________________________________________  FINAL CLINICAL IMPRESSION(S) / ED DIAGNOSES  Final diagnoses:  Dizziness  Elevated blood pressure reading     MEDICATIONS GIVEN DURING THIS VISIT:  Medications - No data to display   ED Discharge Orders    None       Note:  This document was prepared using Dragon voice recognition software and may include unintentional dictation errors.    Hinda Kehr, MD 01/08/18 [id number]

## 2018-01-08 NOTE — ED Notes (Signed)
Report given to Danyla RN.

## 2018-01-08 NOTE — ED Provider Notes (Signed)
[facility] Emergency Department Provider Note ____________________________________________   First MD Initiated Contact with Patient 01/08/18 2134     (approximate)  I have reviewed the triage vital signs and the nursing notes.   HISTORY  Chief Complaint Palpitations    HPI Carmen Grimes is a 58 y.o. female with PMH as noted below who presents with multiple symptoms over the last few days.  Primarily the patient reports intermittent palpitations, occasional pains in her chest and back, and intermittent periods where she feels lightheaded and dizzy.  She denies any movement or spinning sensation.  She denies shortness of breath, fever, or vomiting.  The patient was seen in the ED yesterday and diagnosed with likely anxiety.  The patient states that she drove her son yesterday to [place] to join [employer].  However she states that she was not particularly anxious or stressed about this.  She denies any other acute stressors, although she states that her symptoms are making her anxious.  The patient also states that her blood pressure has been elevated to the 160s over the last few days and she checks it at home last week and it was also elevated.  Past Medical History:  Diagnosis Date  . Asthma    controlled  . Dyspnea 12/2017   difficulty getting her breath at all times  . GERD (gastroesophageal reflux disease)   . H/O: GI bleed   . Osteoarthritis    knees    Patient Active Problem List   Diagnosis Date Noted  . H/O chest tube placement   . Pneumothorax 12/12/2017  . Lung nodule   . Degeneration of lumbar intervertebral disc 12/05/2017  . Cervical spondylosis without myelopathy 06/26/2017  . 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 Synovitis of right hand 02/01/2017  . H/O: GI bleed   . Osteoarthritis   . GERD (gastroesophageal reflux disease)   . Asthma   . Vitamin D deficiency 07/16/2016  . Anxiety 07/12/2016  . Acute  pain of left shoulder 03/17/2016  . Impingement syndrome of left shoulder 03/17/2016  . Right hip pain 12/19/2015  . Primary osteoarthritis of right knee 12/05/2015  . Lichen planus 09/62/8366  . Chest pain 01/13/2015  . Left forearm pain 06/08/2014  . Acute bilateral low back pain with bilateral sciatica 05/16/2014  . Breast pain, left 05/16/2014  . Cervical radiculopathy 05/16/2014  . Abnormal weight gain 02/22/2014  . Cervical pain (neck) 02/22/2014  . Fatigue 02/22/2014  . Hair loss 02/22/2014  . Muscle spasm 02/22/2014  . Mild intermittent asthma 11/12/2013    Past Surgical History:  Procedure Laterality Date  . ABDOMINAL HYSTERECTOMY  2002  . ANTERIOR FUSION CERVICAL SPINE  2009  . COLONOSCOPY  2013   normal  . ELECTROMAGNETIC NAVIGATION BROCHOSCOPY Left 12/12/2017   Procedure: ELECTROMAGNETIC NAVIGATION BRONCHOSCOPY;  Surgeon: Flora Lipps, MD;  Location: [facility] ORS;  Service: Cardiopulmonary;  Laterality: Left;  . ESOPHAGOGASTRODUODENOSCOPY  1990   GI bleed  . FLEXIBLE BRONCHOSCOPY Left 12/12/2017   Procedure: FLEXIBLE BRONCHOSCOPY;  Surgeon: Flora Lipps, MD;  Location: [facility] ORS;  Service: Cardiopulmonary;  Laterality: Left;  . KNEE ARTHROSCOPY Right 2010   x3  . KNEE SURGERY Left    Arthroscopy x1  . LAPAROSCOPIC SUPRACERVICAL HYSTERECTOMY  2002   with BSO for fibroids/ AUB/ pelvic pain  . LAPAROSCOPIC TUBAL LIGATION  1982  . SHOULDER SURGERY Right    rotator cuff repair  . SHOULDER SURGERY Right 2011   second rotator cuff repair  .  tubal reanastamosis  1985    Prior to Admission medications   Medication Sig Start Date End Date Taking? Authorizing Provider  Biotin 10000 MCG TABS Take 1 tablet by mouth daily.    [provider]  chlorpheniramine-HYDROcodone (TUSSIONEX PENNKINETIC ER) 10-8 MG/5ML SUER Take 5 mLs by mouth every 12 (twelve) hours as needed for cough. 12/16/17   Flora Lipps, MD  Cholecalciferol (VITAMIN D3) 50000 units CAPS TAKE ONE CAPSULE  BY MOUTH ONE TIME PER WEEK 03/04/17   [provider]  diclofenac sodium (VOLTAREN) 1 % GEL APPLY 2 G TOPICALLY 3 (THREE) TIMES DAILY AS NEEDED 12/06/16   [provider]  estradiol (ESTRACE) 2 MG tablet Take 1 tablet (2 mg total) by mouth daily. 02/17/17   Dalia Heading, CNM  hydrochlorothiazide (HYDRODIURIL) 12.5 MG tablet Take 1 tablet (12.5 mg total) by mouth daily. 01/08/18 02/07/18  Arta Silence, MD  Misc Natural Products (GLUCOSAMINE CHOND COMPLEX/MSM PO) Take 1 capsule by mouth daily.     [provider]  ondansetron (ZOFRAN-ODT) 8 MG disintegrating tablet TAKE 1 TABLET BY MOUTH EVERY 4 TO 6 HOURS AS NEEDED 11/30/17   [provider]  SYMBICORT 80-4.5 MCG/ACT inhaler Inhale 2 puffs into the lungs 2 (two) times daily.  03/09/17   [provider]  VENTOLIN HFA 108 (90 Base) MCG/ACT inhaler Inhale 2 puffs into the lungs every 6 (six) hours as needed.  11/14/17   [provider]    Allergies Shellfish allergy; Tramadol; Ascorbic acid; Celecoxib; Codeine; Gabapentin; Hydrocodone; Hydrocodone-acetaminophen; Ibuprofen; Naproxen; Prednisone; Singulair [montelukast]; Strawberry extract; Doxycycline; Eggs or egg-derived products; Latex; Nsaids; Strawberry flavor; and Sulfamethoxazole-trimethoprim  Family History  Problem Relation Age of Onset  . Diabetes Mother   . Dementia Mother        Alzheimers  . Hypertension Mother   . Glaucoma Mother   . Prostate cancer Father 35  . Stroke Father   . Arthritis Father   . Osteoporosis Father   . Prostate cancer Brother   . Breast cancer Neg Hx     Social History Social History   Tobacco Use  . Smoking status: Never Smoker  . Smokeless tobacco: Never Used  Substance Use Topics  . Alcohol use: No  . Drug use: No    Review of Systems  Constitutional: No fever. Eyes: No visual changes. ENT: No sore throat. Cardiovascular: Denies active chest pain.  Positive for  palpitations. Respiratory: Denies shortness of breath. Gastrointestinal: No nausea or vomiting.  Genitourinary: Negative for dysuria or frequency.  Musculoskeletal: Positive for intermittent back pain. Skin: Negative for rash. Neurological: Negative for headaches, focal weakness or numbness.   ____________________________________________   PHYSICAL EXAM:  VITAL SIGNS: ED Triage Vitals  Enc Vitals Group     BP 01/08/18 1836 (!) 174/90     Pulse Rate 01/08/18 1836 95     Resp 01/08/18 1836 16     Temp 01/08/18 1836 98.2 F (36.8 C)     Temp Source 01/08/18 1836 Oral     SpO2 01/08/18 1836 100 %     Weight --      Height --      Head Circumference --      Peak Flow --      Pain Score 01/08/18 1834 0     Pain Loc --      Pain Edu? --      Excl. in [place]? --     Constitutional: Alert and oriented.  Anxious appearing but in  no acute distress. Eyes: Conjunctivae are normal.  Head: Atraumatic. Nose: No congestion/rhinnorhea. Mouth/Throat: Mucous membranes are moist.   Neck: Normal range of motion.  Cardiovascular: Normal rate, regular rhythm. Grossly normal heart sounds.  Good peripheral circulation. Respiratory: Normal respiratory effort.  No retractions. Lungs CTAB. Gastrointestinal: No distention.  Musculoskeletal: No lower extremity edema.  Extremities warm and well perfused.  Neurologic:  Normal speech and language. No gross focal neurologic deficits are appreciated.  Skin:  Skin is warm and dry. No rash noted. Psychiatric: Mood and affect are normal. Speech and behavior are normal.  ____________________________________________   LABS (all labs ordered are listed, but only abnormal results are displayed)  Labs Reviewed  BASIC METABOLIC PANEL - Abnormal; Notable for the following components:      Result Value   Glucose, Bld 101 (*)    All other components within normal limits  URINALYSIS, COMPLETE (UACMP) WITH MICROSCOPIC - Abnormal; Notable for the following  components:   Color, Urine YELLOW (*)    APPearance CLEAR (*)    Bacteria, UA RARE (*)    All other components within normal limits  CBC  TROPONIN I  TSH   ____________________________________________  EKG  ED ECG REPORT I, Arta Silence, the attending physician, personally viewed and interpreted this ECG.  Date: 01/08/2018 EKG Time: 1833 Rate: 92 Rhythm: normal sinus rhythm QRS Axis: normal Intervals: normal ST/T Wave abnormalities: normal Narrative Interpretation: no evidence of acute ischemia  ____________________________________________  RADIOLOGY  CXR: Opacity abutting left heart border, unchanged from prior  ____________________________________________   PROCEDURES  Procedure(s) performed: No  Procedures  Critical Care performed: No ____________________________________________   INITIAL IMPRESSION / ASSESSMENT AND PLAN / ED COURSE  Pertinent labs & imaging results that were available during my care of the patient were reviewed by me and considered in my medical decision making (see chart for details).  58 year old female with PMH as noted above presents with multiple symptoms over the last 2 days, primarily intermittent palpitations, some occasional momentary episodes of chest and back pain, and intermittent episodes of lightheadedness.  I reviewed the past medical records in [place].  The patient was seen in the ED yesterday with the same symptoms.  She was diagnosed with most likely anxiety.  She had negative labs at that time.  On exam today the patient is well-appearing.  Her blood pressure is elevated but her other vital signs are normal.  She does appear quite anxious when describing her symptoms although she adamantly denies any significant life stress or anxiety.  She states that the symptoms themselves are making her anxious.  Overall the presentation does appear most consistent with anxiety and may be physical manifestations of life stressors.   However many of her symptoms could also be caused by elevated blood pressure.  I think now that the patient has had multiple readings with elevated blood pressure including at home last week, it would be reasonable to start her on an antihypertensive until she can follow-up with her primary care doctor.  I added on a TSH and UA to rule out other etiologies of her symptoms.  Her troponin is negative both today and yesterday, and there is no evidence of acute cardiac etiology.  There are no risk factors for or clinical evidence suggesting PE.  I explained the results of the work-up to the patient.  She agrees with the plan to start on an antihypertensive.  She states she will call her primary care doctor tomorrow to arrange for follow-up  within the next few weeks.  I gave her thorough return precautions and she expressed understanding.  She is stable for discharge at this time.  ____________________________________________   FINAL CLINICAL IMPRESSION(S) / ED DIAGNOSES  Final diagnoses:  Palpitations  Hypertension, unspecified type      NEW MEDICATIONS STARTED DURING THIS VISIT:  Discharge Medication List as of 01/08/2018 11:14 PM    START taking these medications   Details  hydrochlorothiazide (HYDRODIURIL) 12.5 MG tablet Take 1 tablet (12.5 mg total) by mouth daily., Starting Sun 01/08/2018, Until Tue 02/07/2018, Normal         Note:  This document was prepared using Dragon voice recognition software and may include unintentional dictation errors.    Arta Silence, MD 01/08/18 2330

## 2018-01-08 NOTE — Discharge Instructions (Addendum)
Call your doctor tomorrow to make an appointment.  Return to the ER for new or worsening dizziness, weakness, palpitations, any chest pain, or any other new or worsening symptoms that concern you.  Take the blood pressure medication as prescribed until you follow-up with your doctor.

## 2018-01-08 NOTE — ED Triage Notes (Signed)
Pt to ED via POV c/o feeling like her heart is racing. Pt states that she was seen in the ED yesterday. Pt states that since then she has been taking it easy. Pts reports that she has had her symptoms all day. Pt appears anxious in triage.

## 2018-01-08 NOTE — Discharge Instructions (Signed)
Your workup in the Emergency Department today was reassuring.  We did not find any specific abnormalities.  It is possible that anxiety or panic attacks could have been playing a role in your symptoms today, and I encourage you to read through the information provided about panic attacks as well as dizziness.    We recommend you drink plenty of fluids, take your regular medications and/or any new ones prescribed today, and follow up with the doctor(s) listed in these documents as recommended.  Return to the Emergency Department if you develop new or worsening symptoms that concern you.

## 2018-01-16 ENCOUNTER — Encounter: Payer: Self-pay | Admitting: Physician Assistant

## 2018-01-17 ENCOUNTER — Encounter: Payer: Self-pay | Admitting: Physician Assistant

## 2018-01-20 ENCOUNTER — Ambulatory Visit
Admission: RE | Admit: 2018-01-20 | Discharge: 2018-01-20 | Disposition: A | Discharge status: Home / Self Care | Payer: Medicare Other | Source: Ambulatory Visit | Attending: Internal Medicine | Admitting: Internal Medicine

## 2018-01-20 DIAGNOSIS — R918 Other nonspecific abnormal finding of lung field: Secondary | ICD-10-CM | POA: Diagnosis present

## 2018-01-23 ENCOUNTER — Encounter: Payer: Self-pay | Admitting: Physician Assistant

## 2018-01-25 ENCOUNTER — Other Ambulatory Visit: Payer: Self-pay | Admitting: Family

## 2018-01-25 DIAGNOSIS — R1013 Epigastric pain: Secondary | ICD-10-CM

## 2018-01-27 ENCOUNTER — Ambulatory Visit: Payer: Medicare Other

## 2018-01-27 ENCOUNTER — Other Ambulatory Visit: Payer: Self-pay

## 2018-01-27 ENCOUNTER — Emergency Department: Payer: Medicare Other

## 2018-01-27 ENCOUNTER — Emergency Department
Admission: EM | Admit: 2018-01-27 | Discharge: 2018-01-27 | Disposition: A | Discharge status: Home / Self Care | Payer: Medicare Other | Attending: Emergency Medicine | Admitting: Emergency Medicine

## 2018-01-27 ENCOUNTER — Encounter: Payer: Self-pay | Admitting: Emergency Medicine

## 2018-01-27 ENCOUNTER — Encounter: Payer: Self-pay | Admitting: Physician Assistant

## 2018-01-27 DIAGNOSIS — R42 Dizziness and giddiness: Secondary | ICD-10-CM | POA: Insufficient documentation

## 2018-01-27 DIAGNOSIS — R0602 Shortness of breath: Secondary | ICD-10-CM | POA: Diagnosis not present

## 2018-01-27 DIAGNOSIS — J45909 Unspecified asthma, uncomplicated: Secondary | ICD-10-CM | POA: Diagnosis not present

## 2018-01-27 DIAGNOSIS — Z91040 Latex allergy status: Secondary | ICD-10-CM | POA: Diagnosis not present

## 2018-01-27 DIAGNOSIS — I318 Other specified diseases of pericardium: Secondary | ICD-10-CM | POA: Insufficient documentation

## 2018-01-27 DIAGNOSIS — F419 Anxiety disorder, unspecified: Secondary | ICD-10-CM | POA: Insufficient documentation

## 2018-01-27 DIAGNOSIS — R079 Chest pain, unspecified: Secondary | ICD-10-CM

## 2018-01-27 LAB — CBC WITH DIFFERENTIAL/PLATELET
Abs Immature Granulocytes: 0.03 10*3/uL (ref 0.00–0.07)
Basophils Absolute: 0 10*3/uL (ref 0.0–0.1)
Basophils Relative: 1 %
Eosinophils Absolute: 0.2 10*3/uL (ref 0.0–0.5)
Eosinophils Relative: 3 %
HCT: 39.9 % (ref 36.0–46.0)
HEMOGLOBIN: 12.7 g/dL (ref 12.0–15.0)
Immature Granulocytes: 0 %
LYMPHS ABS: 2.2 10*3/uL (ref 0.7–4.0)
Lymphocytes Relative: 26 %
MCH: 30.3 pg (ref 26.0–34.0)
MCHC: 31.8 g/dL (ref 30.0–36.0)
MCV: 95.2 fL (ref 80.0–100.0)
Monocytes Absolute: 0.6 10*3/uL (ref 0.1–1.0)
Monocytes Relative: 7 %
Neutro Abs: 5.4 10*3/uL (ref 1.7–7.7)
Neutrophils Relative %: 63 %
Platelets: 333 10*3/uL (ref 150–400)
RBC: 4.19 MIL/uL (ref 3.87–5.11)
RDW: 12.3 % (ref 11.5–15.5)
WBC: 8.4 10*3/uL (ref 4.0–10.5)
nRBC: 0 % (ref 0.0–0.2)

## 2018-01-27 LAB — BRAIN NATRIURETIC PEPTIDE: B Natriuretic Peptide: 44 pg/mL (ref 0.0–100.0)

## 2018-01-27 LAB — COMPREHENSIVE METABOLIC PANEL WITH GFR
ALT: 12 U/L (ref 0–44)
AST: 21 U/L (ref 15–41)
Albumin: 4.1 g/dL (ref 3.5–5.0)
Alkaline Phosphatase: 57 U/L (ref 38–126)
Anion gap: 7 (ref 5–15)
BUN: 10 mg/dL (ref 6–20)
CO2: 27 mmol/L (ref 22–32)
Calcium: 9.1 mg/dL (ref 8.9–10.3)
Chloride: 104 mmol/L (ref 98–111)
Creatinine, Ser: 0.7 mg/dL (ref 0.44–1.00)
GFR calc Af Amer: >60 mL/min
GFR calc non Af Amer: >60 mL/min
Glucose, Bld: 99 mg/dL (ref 70–99)
Potassium: 4 mmol/L (ref 3.5–5.1)
Sodium: 138 mmol/L (ref 135–145)
Total Bilirubin: 0.8 mg/dL (ref 0.3–1.2)
Total Protein: 7.5 g/dL (ref 6.5–8.1)

## 2018-01-27 LAB — LIPASE, BLOOD: Lipase: 23 U/L (ref 11–51)

## 2018-01-27 LAB — TROPONIN I: Troponin I: <0.03 ng/mL (ref <0.03)

## 2018-01-27 NOTE — ED Provider Notes (Signed)
[facility] Emergency Department Provider Note  ____________________________________________  Time seen: Approximately 7:40 AM  I have reviewed the triage vital signs and the nursing notes.   HISTORY  Chief Complaint Chest Pain    HPI Carmen Grimes is a 58 y.o. female with a history of GERD presenting with chest pain.  The patient gives an incomplete history, so I am piecing together what she has told me and her recent medical charts both from [facility] and [facility].  In December 2019, the patient was found to have a lung nodule, and underwent biopsy.  Unfortunately, she developed a pneumothorax and was admitted to the hospital for chest tube with resolution of her pneumothorax.  The biopsy results came back negative for malignancy.  The patient reports that since January 4 she has been having almost daily episodes of chest pain.  She describes an occasional pressure sensation and sometimes sharp pains that occur in the center of the chest and last for several seconds.  This causes her to have to take a deep breath.  Sometimes she has pressure in the jaw.  She does not have associated palpitations, diaphoresis, nausea or vomiting, but does occasionally feel lightheaded.  She has had multiple emergency department visits, both at [facility] and at [facility].  She has had reassuring EKGs and troponin evaluations, but has followed up with cardiology and seen Dr. Juanda Bond.  She record reports that she had tachycardia and hypertension and was started on metoprolol with improvement of those abnormalities.  She did undergo stress testing, and reports that due to an abnormality, Dr. Yancey Flemings has ordered a CT of the coronaries for this morning at 9 AM.  She has had CT imaging that has been negative for PE.  I have reviewed the CT scan from 01/09/2018 at [facility], which is negative for PE but concerning because there is evidence of a mass against the  pericardium concerning for malignancy.  I am unable to see that there has been follow-up for this.  CT reading: Interface, Rad Results In - 01/10/2018 12:34 PM EST  Procedure: CT CHEST PE PROTOCOL INCL CT CHEST ANGIOGRAM W WO CONTRAST  Indication: PE suspected, intermediate prob, neg D-dimer, cp, dyspnea, tachy, R00.0 Tachycardia, unspecified  Technique: Chest CTA PE Protocol. Volumetric chest CTA acquisition was performed from the lower neck to the adrenal glands. The study was acquired following IV administration of iodinated contrast material to improve disease detection and further define anatomy. 3D Maximal intensity projection (MIP) reconstructions were performed to potentially increase study sensitivity. Likewise, 3D reconstructions were performed to evaluate vascular anatomy.  Comparison(s): None  Findings:  Normal sized main pulmonary artery with normal filling of the pulmonary arterial system and no evidence for pulmonary embolism.  Normal-size thoracic aorta without notable atherosclerotic disease. Thyroid gland IS within normal limits. There is no axillary or mediastinal lymphadenopathy and there is no pleural fluid.  Limited images of the upper abdomen demonstrate a left renal cyst the bones are within normal limits. There is some diffuse bronchial wall thickening and some focal left lingular atelectasis as well as a 3.2 x 2.0 cm soft tissue mass adjacent to the pericardium by the LV that is of indeterminant etiology but is concerning for possible malignancy.   The esophagus is underdistended limiting evaluation. 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 Left vertebral artery independently arises from the aortic arch the proximal aspect of the great vessels off the aortic arch are all normal in size.  IMPRESSION: 1. No evidence for pulmonary embolism. 2. Burtis Junes the presence of a left lingular atelectasis possibly with some mucoid impaction. 3. There is a larger  mass adjacent to the pericardium that is soft tissue density concerning for malignancy.  Dario Guardian RN, on 01/10/2018    Electronically Signed by: Tereasa Coop, MD, [facility] Electronically Signed on: 01/10/2018 12:33 PM   Today, the patient is presenting to the emergency department for ongoing episodes of chest pain that are unchanged in character or severity.  She reports associated lightheadedness and has been feeling generalized weakness.  These episodes of chest pain occur while at rest and there is nothing she can do to make them better or worse.    Past Medical History:  Diagnosis Date  . Asthma    controlled  . Dyspnea 12/2017   difficulty getting her breath at all times  . GERD (gastroesophageal reflux disease)   . H/O: GI bleed   . Osteoarthritis    knees    Patient Active Problem List   Diagnosis Date Noted  . H/O chest tube placement   . Pneumothorax 12/12/2017  . Lung nodule   . Degeneration of lumbar intervertebral disc 12/05/2017  . Cervical spondylosis without myelopathy 06/26/2017  . 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 Synovitis of right hand 02/01/2017  . H/O: GI bleed   . Osteoarthritis   . GERD (gastroesophageal reflux disease)   . Asthma   . Vitamin D deficiency 07/16/2016  . Anxiety 07/12/2016  . Acute pain of left shoulder 03/17/2016  . Impingement syndrome of left shoulder 03/17/2016  . Right hip pain 12/19/2015  . Primary osteoarthritis of right knee 12/05/2015  . Lichen planus 16/10/9602  . Chest pain 01/13/2015  . Left forearm pain 06/08/2014  . Acute bilateral low back pain with bilateral sciatica 05/16/2014  . Breast pain, left 05/16/2014  . Cervical radiculopathy 05/16/2014  . Abnormal weight gain 02/22/2014  . Cervical pain (neck) 02/22/2014  . Fatigue 02/22/2014  . Hair loss 02/22/2014  . Muscle spasm 02/22/2014  . Mild intermittent asthma 11/12/2013    Past Surgical History:  Procedure Laterality Date  .  ABDOMINAL HYSTERECTOMY  2002  . ANTERIOR FUSION CERVICAL SPINE  2009  . COLONOSCOPY  2013   normal  . ELECTROMAGNETIC NAVIGATION BROCHOSCOPY Left 12/12/2017   Procedure: ELECTROMAGNETIC NAVIGATION BRONCHOSCOPY;  Surgeon: Flora Lipps, MD;  Location: [facility] ORS;  Service: Cardiopulmonary;  Laterality: Left;  . ESOPHAGOGASTRODUODENOSCOPY  1990   GI bleed  . FLEXIBLE BRONCHOSCOPY Left 12/12/2017   Procedure: FLEXIBLE BRONCHOSCOPY;  Surgeon: Flora Lipps, MD;  Location: [facility] ORS;  Service: Cardiopulmonary;  Laterality: Left;  . KNEE ARTHROSCOPY Right 2010   x3  . KNEE SURGERY Left    Arthroscopy x1  . LAPAROSCOPIC SUPRACERVICAL HYSTERECTOMY  2002   with BSO for fibroids/ AUB/ pelvic pain  . LAPAROSCOPIC TUBAL LIGATION  1982  . SHOULDER SURGERY Right    rotator cuff repair  . SHOULDER SURGERY Right 2011   second rotator cuff repair  . tubal reanastamosis  1985    Current Outpatient Rx  . Order #: [id number] Class: Historical Med  . Order #: [id number] Class: Print  . Order #: [id number] Class: Historical Med  . Order #: [id number] Class: Historical Med  . Order #: [id number] Class: Normal  . Order #: [id number] Class: Normal  . Order #: [id number] Class: Historical Med  . Order #: [id number] Class: Historical Med  . Order #: [id number] Class: Historical Med  . Order #: [id number] Class: Historical Med  Allergies Shellfish allergy; Tramadol; Ascorbic acid; Celecoxib; Codeine; Gabapentin; Hydrocodone; Hydrocodone-acetaminophen; Ibuprofen; Naproxen; Prednisone; Singulair [montelukast]; Strawberry extract; Doxycycline; Eggs or egg-derived products; Latex; Nsaids; Strawberry flavor; and Sulfamethoxazole-trimethoprim  Family History  Problem Relation Age of Onset  . Diabetes Mother   . Dementia Mother        Alzheimers  . Hypertension Mother   . Glaucoma Mother   . Prostate cancer Father 67  . Stroke Father   . Arthritis Father   . Osteoporosis Father   . Prostate cancer Brother   . Breast cancer  Neg Hx     Social History Social History   Tobacco Use  . Smoking status: Never Smoker  . Smokeless tobacco: Never Used  Substance Use Topics  . Alcohol use: No  . Drug use: No    Review of Systems Constitutional: No fever/chills.  Positive lightheadedness without syncope.  No diaphoresis.  Positive generalized weakness. Eyes: No visual changes. ENT: No sore throat. No congestion or rhinorrhea. Cardiovascular: Positive chest pain. Denies palpitations. Respiratory: Positive shortness of breath.  No cough. Gastrointestinal: No abdominal pain.  No nausea, no vomiting.  No diarrhea.  No constipation. Genitourinary: Negative for dysuria. Musculoskeletal: Negative for back pain.  Swelling or calf pain. Skin: Negative for rash. Neurological: Negative for headaches. No focal numbness, tingling or weakness.     ____________________________________________   PHYSICAL EXAM:  VITAL SIGNS: ED Triage Vitals  Enc Vitals Group     BP 01/27/18 0607 (!) 155/85     Pulse Rate 01/27/18 0607 71     Resp 01/27/18 0607 20     Temp 01/27/18 0607 97.8 F (36.6 C)     Temp Source 01/27/18 0607 Oral     SpO2 01/27/18 0607 99 %     Weight 01/27/18 0602 159 lb (72.1 kg)     Height 01/27/18 0602 _0  (1.549 m)     Head Circumference --      Peak Flow --      Pain Score 01/27/18 0602 5     Pain Loc --      Pain Edu? --      Excl. in [place]? --     Constitutional: Alert and oriented. Answers questions appropriately.  Anxious appearing. Eyes: Conjunctivae are normal.  EOMI. No scleral icterus. Head: Atraumatic. Nose: No congestion/rhinnorhea. Mouth/Throat: Mucous membranes are moist.  Neck: No stridor.  Supple.  No JVD.  No meningismus. Cardiovascular: Normal rate, regular rhythm. No murmurs, rubs or gallops.  Respiratory: Normal respiratory effort.  No accessory muscle use or retractions. Lungs CTAB.  No wheezes, rales or ronchi. Gastrointestinal: Soft, nontender and nondistended.  No  guarding or rebound.  No peritoneal signs. Musculoskeletal: No LE edema. No ttp in the calves or palpable cords.  Negative Homan's sign. Neurologic:  A&Ox3.  Speech is clear.  Face and smile are symmetric.  EOMI.  Moves all extremities well. Skin:  Skin is warm, dry and intact. No rash noted. Psychiatric: Normal mood with anxious affect.  ____________________________________________   LABS (all labs ordered are listed, but only abnormal results are displayed)  Labs Reviewed  COMPREHENSIVE METABOLIC PANEL  LIPASE, BLOOD  BRAIN NATRIURETIC PEPTIDE  TROPONIN I  CBC WITH DIFFERENTIAL/PLATELET   ____________________________________________  EKG  ED ECG REPORT I, Anne-Caroline Mariea Clonts, the attending physician, personally viewed and interpreted this ECG.   Date: 01/27/2018  EKG Time: 609  Rate: 64  Rhythm: normal sinus rhythm  Axis: normal  Intervals:none  ST&T Change: No STEMI  ____________________________________________  RADIOLOGY  Dg Chest 2 View  Result Date: 01/27/2018 CLINICAL DATA:  Weakness chest pain EXAM: CHEST - 2 VIEW COMPARISON:  Chest CT 01/20/2018 FINDINGS: Nodular density over the lingula, known from recent chest CT. There is no edema, consolidation, effusion, or pneumothorax. Normal heart size and mediastinal contours. Postoperative cervical spine and right shoulder. IMPRESSION: No evidence of active disease. Electronically Signed   By: Monte Fantasia M.D.   On: 01/27/2018 07:05    ____________________________________________   PROCEDURES  Procedure(s) performed: None  Procedures  Critical Care performed: No ____________________________________________   INITIAL IMPRESSION / ASSESSMENT AND PLAN / ED COURSE  Pertinent labs & imaging results that were available during my care of the patient were reviewed by me and considered in my medical decision making (see chart for details).  58 y.o. female with approximately 1 month of chest pain.  Overall,  the patient is safe from an emergency department perspective.  I do not believe that her symptoms are related to ACS or MI and I see no evidence of ischemia, arrhythmia.  PE is considered but again very unlikely especially given her recent negative imaging without any change of symptoms, hypoxia, or evidence of DVT clinically.  I have talked at length with the patient about the results of the mass on her pericardium that was seen on CT at [facility].  She was not aware of this finding, and has no follow-up.  I have spoken with Dr. Mike Gip, who will see the patient in the clinic.  She states that she will follow-up on the imaging, and take the patient's case to her tumor review board which includes a CT surgeon, to see whether she can be treated and evaluated at [facility] or whether she needs referral to a tertiary care center.    I have also spoken with Dr. Wynn Maudlin, and we will proceed with CT angiography of the coronaries today to complete her full cardiac evaluation.  At this time, the patient understands both her findings, as well as the plan.  She will go directly to Dr. Sharol Roussel office for CT angiography and then follow up with him in the clinic today.  I have sent an email message to Dr. Mike Gip with the patient's information and she will have an appointment in the oncology clinic for close follow-up.  She understands return precautions.  ____________________________________________  FINAL CLINICAL IMPRESSION(S) / ED DIAGNOSES  Final diagnoses:  Pericardial mass  Chest pain, unspecified type  Lightheadedness         NEW MEDICATIONS STARTED DURING THIS VISIT:  New Prescriptions   No medications on file      Eula Listen, MD 01/27/18 [phone]

## 2018-01-27 NOTE — Discharge Instructions (Addendum)
Today her work-up in the emergency department has been reassuring.  However, when reviewing the CT scan you had on 01/09/2018 at [facility], there is a mass that is next to your heart that needs evaluation.  Please call Dr. Kem Parkinson office, the oncology doctor, who will see you in clinic for further work-up of this abnormal mass that may be concerning for cancer.  Please go directly to your appointment for the CT scan of the arteries of your heart.  After that, proceed to Dr. Marella Bile office, where he will give you the results.  Return to the emergency department if you develop severe pain, lightheadedness or fainting, palpitations, shortness of breath, or any other symptoms concerning to you.

## 2018-01-27 NOTE — ED Notes (Signed)
Pt is extremely anxious and very worried about her heart and her health. States she can't have a heart attack and she doesn't want to die. 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 Pt is tearful and scared to be left alone

## 2018-01-27 NOTE — ED Notes (Signed)
Topez not working 

## 2018-01-27 NOTE — ED Triage Notes (Signed)
Patient ambulatory to triage with steady gait, without difficulty or distress noted; pt reports mid CP radiating into jaw accomp by [facility] and heart racing

## 2018-01-30 ENCOUNTER — Encounter: Payer: Self-pay | Admitting: Physician Assistant

## 2018-01-30 ENCOUNTER — Ambulatory Visit: Payer: Medicare Other

## 2018-01-30 ENCOUNTER — Other Ambulatory Visit: Payer: Self-pay | Admitting: Hematology and Oncology

## 2018-02-01 ENCOUNTER — Inpatient Hospital Stay: Payer: Medicare Other | Attending: Hematology and Oncology | Admitting: Hematology and Oncology

## 2018-02-01 ENCOUNTER — Other Ambulatory Visit: Payer: Self-pay | Admitting: Hematology and Oncology

## 2018-02-01 ENCOUNTER — Encounter: Payer: Self-pay | Admitting: Hematology and Oncology

## 2018-02-01 ENCOUNTER — Inpatient Hospital Stay: Payer: Medicare Other

## 2018-02-01 VITALS — BP 142/63 | HR 57 | Temp 97.9°F | Resp 18 | Ht 61.0 in | Wt 159.2 lb

## 2018-02-01 DIAGNOSIS — Z8042 Family history of malignant neoplasm of prostate: Secondary | ICD-10-CM

## 2018-02-01 DIAGNOSIS — I318 Other specified diseases of pericardium: Secondary | ICD-10-CM

## 2018-02-01 DIAGNOSIS — Z885 Allergy status to narcotic agent status: Secondary | ICD-10-CM | POA: Diagnosis not present

## 2018-02-01 DIAGNOSIS — Z91012 Allergy to eggs: Secondary | ICD-10-CM | POA: Diagnosis not present

## 2018-02-01 DIAGNOSIS — Z803 Family history of malignant neoplasm of breast: Secondary | ICD-10-CM

## 2018-02-01 DIAGNOSIS — K219 Gastro-esophageal reflux disease without esophagitis: Secondary | ICD-10-CM | POA: Diagnosis not present

## 2018-02-01 DIAGNOSIS — R079 Chest pain, unspecified: Secondary | ICD-10-CM

## 2018-02-01 DIAGNOSIS — R911 Solitary pulmonary nodule: Secondary | ICD-10-CM

## 2018-02-01 DIAGNOSIS — Z91018 Allergy to other foods: Secondary | ICD-10-CM | POA: Diagnosis not present

## 2018-02-01 DIAGNOSIS — Z87891 Personal history of nicotine dependence: Secondary | ICD-10-CM | POA: Diagnosis not present

## 2018-02-01 DIAGNOSIS — Z79899 Other long term (current) drug therapy: Secondary | ICD-10-CM | POA: Diagnosis not present

## 2018-02-01 DIAGNOSIS — Z91013 Allergy to seafood: Secondary | ICD-10-CM | POA: Diagnosis not present

## 2018-02-01 DIAGNOSIS — Z888 Allergy status to other drugs, medicaments and biological substances status: Secondary | ICD-10-CM | POA: Diagnosis not present

## 2018-02-01 DIAGNOSIS — E7800 Pure hypercholesterolemia, unspecified: Secondary | ICD-10-CM | POA: Diagnosis not present

## 2018-02-01 DIAGNOSIS — J45909 Unspecified asthma, uncomplicated: Secondary | ICD-10-CM | POA: Diagnosis not present

## 2018-02-01 DIAGNOSIS — I10 Essential (primary) hypertension: Secondary | ICD-10-CM | POA: Diagnosis not present

## 2018-02-01 DIAGNOSIS — C7A8 Other malignant neuroendocrine tumors: Secondary | ICD-10-CM | POA: Diagnosis not present

## 2018-02-01 DIAGNOSIS — Z90711 Acquired absence of uterus with remaining cervical stump: Secondary | ICD-10-CM | POA: Diagnosis not present

## 2018-02-01 DIAGNOSIS — R0789 Other chest pain: Secondary | ICD-10-CM | POA: Diagnosis not present

## 2018-02-01 DIAGNOSIS — E785 Hyperlipidemia, unspecified: Secondary | ICD-10-CM | POA: Diagnosis not present

## 2018-02-01 LAB — LACTATE DEHYDROGENASE: LDH: 143 U/L (ref 98–192)

## 2018-02-01 LAB — URIC ACID: Uric Acid, Serum: 4.2 mg/dL (ref 2.5–7.1)

## 2018-02-01 NOTE — Progress Notes (Signed)
[facility] day:  02/01/2018  Chief Complaint: Carmen Grimes is a 58 y.o. female with a pericardial mass who is referred in consultation by Dr. Mariea Clonts for assessment and management.  HPI:   The patient has a 18-pack-year smoking history.  She stopped smoking in 1985.  In 10/2017, she developed shortness of breath, a productive cough, chills, sweats, and fatigue.  She received 3 courses of antibiotics (azithromycin, Augmentin, and Levaquin).  CXR on 11/02/2017 revealed a 1.5 x 2.6 cm nodule in the lingula worrisome for malignancy.   Chest CT on 11/11/2017 revealed a 1.5 x 2.2 cm nodule within the lingula which was thought to represent primary pulmonary malignancy or potentially focal infectious process.  PET scan on 11/21/2017 revealed hypermetabolic lingular opacity. This was similar in size to 11/11/2017. Concurrent anterior left lower lobe hypermetabolic opacity, increased compared to 11/11/2017. This short-term progression, as well as morphology, may represent progressive infection.  Neoplasia, especially in the lingula, cannot be excluded.    She was seen by Dr Genevive Bi on 12/05/2017. Notes reviewed.  Discussions were held regarding repeat CT scan in 6 to 8 weeks or navigational bronchoscopy.  She was seen by Dr. Mortimer Fries on 12/06/2017 for an abnormal CT.  She had a productive cough yellow-tinged sputum with sweats and chills most likely related to asthma exacerbation with left-sided pneumonia with mucus plugging, however malignancy was a possibility but was felt to be a low likelihood.  Discussions were held regarding ENB for further evaluation and diagnostic evaluation.  A super D CT chest was ordered prior to procedure.  CT super D CT on 12/09/2017 revealed lingular and LEFT lower lobe nodules as seen on recent PET-CT. The anterior LEFT lower lobe nodule measured slightly larger than on 11/22/2017. The lingular nodule was stable.  She underwent  electromagnetic navigation bronchoscopy (ENB) with LUL sampling on 12/12/2017.  Pathology was benign.  She had a pneumothorax after her biopsy requiring chest tube placement.  She was seen in follow-up by Dr. Genevive Bi on 12/26/2017.  CXR revealed no pneumothorax following chest tube removal.  There was persistent LEFT lung mass adjacent to LEFT heart border.  Since 01/07/2018, she has had almost daily episodes of chest pain described as an ocasional pressure with at times sharp pains that occur in the center of the chest and last for several seconds. Sometimes she had had pressure in her jaw.  She has had multiple [facility] and [facility] ER visits.  EKGs and troponin have been unremarkable.  She was seen by Dr. Neoma Laming.  She was started on metoprolol.  Chest CT angiogram at [facility] on 01/10/2018 revealed no pulmonary embolism.  There was a 3.2 x 1.7 cm soft tissue mass adjacent to the pericardium by the left ventricle concerning for malignancy. There was felt to be left lingular atelectasis possibly with some mucoid impaction.  Chest CT without contrast at [facility] on 01/20/2018 revealed a 2.5 x 1.0 more flattened lingular nodule (compared to 2.2 x 1.5 cm) on the CT dated 11/11/2017.  The nodule in the anterior aspect of the LEFT lower lobe had increased in size to 3.2 x 1.7 cm (previously 2.9 x 1.9 cm) on PET-CT 11/22/2017.  There were no pathologically enlarged mediastinal or hilar lymph nodes.  The patient presented to [facility] ER on 01/27/2018 with chest pain.  She underwent stress testing, and was noted to have an abnormality. Dr. Humphrey Rolls has ordered a CT angiography study.  CBC and CMP  were normal.  Symptomatically, she has not felt like herself since 01/07/2018. Patient has been experience alternating episodes of being hot and cold. She has intermittent episodes of diaphoresis. She denies any changes to her bowel habits. Appetite has been decreased; "I have to force myself to eat something". Weight today is  159 lb 2.8 oz (72.2 kg), which remains fairly stable for her. Patient is a [employer] and adheres to a very strict vegan diet.   Patient has headaches, weakness, and is experiencing vertigo symptoms since being started on metoprolol. She has plans to see PCP today to discuss discontinuation. Patient denies any increased shortness of breath. No fevers.  She notes feeling "cold" and keeping her apartment at 80 degrees.  Patient recently treated for "atypical pneumonia".  She denies any cough. Patient notes that she is often too weak to get out of bed due to her weakness and fear of experiencing syncope.   Patient has intermittent episodes of chest pain in various locations. Pain does not radiate in to shoulder, scapula, or neck/jaw. Patient has frequent episodes of nausea without vomiting. She has chronic arthralgias secondary to known OA. She denies abdominal pain and urinary symptoms.   Patient is scheduled to follow up with pulmonary medicine Mortimer Fries, MD) on 02/03/2018.  Patient denies family history that is significant for any type of hematologic disorders. Father and brother had prostate cancer. Sister has breast cancer.    Past Medical History:  Diagnosis Date  . Asthma    controlled  . Dyspnea 12/2017   difficulty getting her breath at all times  . GERD (gastroesophageal reflux disease)   . H/O: GI bleed   . Osteoarthritis    knees    Past Surgical History:  Procedure Laterality Date  . ABDOMINAL HYSTERECTOMY  2002  . ANTERIOR FUSION CERVICAL SPINE  2009  . COLONOSCOPY  2013   normal  . ELECTROMAGNETIC NAVIGATION BROCHOSCOPY Left 12/12/2017   Procedure: ELECTROMAGNETIC NAVIGATION BRONCHOSCOPY;  Surgeon: Flora Lipps, MD;  Location: [facility] ORS;  Service: Cardiopulmonary;  Laterality: Left;  . ESOPHAGOGASTRODUODENOSCOPY  1990   GI bleed  . FLEXIBLE BRONCHOSCOPY Left 12/12/2017   Procedure: FLEXIBLE BRONCHOSCOPY;  Surgeon: Flora Lipps, MD;  Location: [facility] ORS;  Service:  Cardiopulmonary;  Laterality: Left;  . KNEE ARTHROSCOPY Right 2010   x3  . KNEE SURGERY Left    Arthroscopy x1  . LAPAROSCOPIC SUPRACERVICAL HYSTERECTOMY  2002   with BSO for fibroids/ AUB/ pelvic pain  . LAPAROSCOPIC TUBAL LIGATION  1982  . SHOULDER SURGERY Right    rotator cuff repair  . SHOULDER SURGERY Right 2011   second rotator cuff repair  . tubal reanastamosis  1985    Family History  Problem Relation Age of Onset  . Diabetes Mother   . Dementia Mother        Alzheimers  . Hypertension Mother   . Glaucoma Mother   . Prostate cancer Father 65  . Stroke Father   . Arthritis Father   . Osteoporosis Father   . Prostate cancer Brother   . Breast cancer Neg Hx     Social History:  reports that she has quit smoking. She has a 13.00 pack-year smoking history. She has never used smokeless tobacco. She reports previous alcohol use. She reports that she does not use drugs.  Former 1 ppd smoker for 13 years. Former ETOH use when she was younger; none now. Lives in [place] since 2018; moved from [place].  Patient is a  medical assistant and phlebotomist.  She is planning on going to MRI school (she was to start in 01/2018). [employer]; adheres to strict vegan diet.  She has a dog and a cat.  The patient is alone today.  Allergies:  Allergies  Allergen Reactions  . Shellfish Allergy Anaphylaxis    Other reaction(s): anaphylaxis/angioedema Other reaction(s): anaphylaxis/angioedema  Betadine on skin is okay  . Tramadol Other (See Comments)    "blacked out", found wandering in the street  . Ascorbic Acid Hives and Nausea And Vomiting    HIVES / VOMITING   . Celecoxib Other (See Comments)    GI bleed Other reaction(s): gi distress, Other (comments), Other (See Comments) GI BLEED       . Codeine Itching and Nausea Only    Other reaction(s): mild rash/itching  . Gabapentin Other (See Comments)    Drowsiness. Like a zombie, not functional   .  Hydrocodone Other (See Comments)    Hair falls out.  Marland Kitchen Hydrocodone-Acetaminophen     Other reaction(s): other/intolerance HAIR FALLS OUT   . Ibuprofen Other (See Comments)    "Blood disorder", GI bleed. Other reaction(s): gi distress, Other (comments), Other (See Comments)   . Naproxen Other (See Comments)    GI bleed Other reaction(s): Other (comments), Other (See Comments)        . Prednisone Other (See Comments)    Tremors, severe  . Singulair [Montelukast] Other (See Comments)    Syncope..passed out  . Strawberry Extract Hives, Nausea And Vomiting and Nausea Only    Severe vomitting  . Doxycycline Other (See Comments)    Unknown reaction. Other reaction(s): Other (See Comments), Unknown, Unknown (comments) Pt does not remember   . Eggs Or Egg-Derived Products Rash and Other (See Comments)    rash  . Latex Rash and Itching    Other reaction(s): mild rash/itching  . Nsaids Nausea And Vomiting and Other (See Comments)    Gastric bleed        . Strawberry Flavor Hives  . Sulfamethoxazole-Trimethoprim Other (See Comments)    Other reaction(s): Other (See Comments), other/intolerance MAKES PAIN WORSE  Increases pain Increases pain     Current Medications: Current Outpatient Medications  Medication Sig Dispense Refill  . Biotin 10000 MCG TABS Take 1 tablet by mouth daily.    Marland Kitchen estradiol (ESTRACE) 2 MG tablet Take 1 tablet (2 mg total) by mouth daily. 90 tablet 3  . VENTOLIN HFA 108 (90 Base) MCG/ACT inhaler Inhale 2 puffs into the lungs every 6 (six) hours as needed.   6  . chlorpheniramine-HYDROcodone (TUSSIONEX PENNKINETIC ER) 10-8 MG/5ML SUER Take 5 mLs by mouth every 12 (twelve) hours as needed for cough. (Patient not taking: Reported on 02/01/2018) 140 mL 0  . Cholecalciferol (VITAMIN D3) 50000 units CAPS TAKE ONE CAPSULE BY MOUTH ONE TIME PER WEEK  1  . diclofenac sodium (VOLTAREN) 1 % GEL APPLY 2 G TOPICALLY 3 (THREE) TIMES DAILY AS NEEDED  0  .  hydrochlorothiazide (HYDRODIURIL) 12.5 MG tablet Take 1 tablet (12.5 mg total) by mouth daily. (Patient not taking: Reported on 02/01/2018) 30 tablet 0  . metoprolol tartrate (LOPRESSOR) 50 MG tablet Take by mouth.    . Misc Natural Products (GLUCOSAMINE CHOND COMPLEX/MSM PO) Take 1 capsule by mouth daily.     . ondansetron (ZOFRAN-ODT) 8 MG disintegrating tablet TAKE 1 TABLET BY MOUTH EVERY 4 TO 6 HOURS AS NEEDED  0  . SYMBICORT 80-4.5 MCG/ACT inhaler Inhale 2 puffs  into the lungs 2 (two) times daily.      No current facility-administered medications for this visit.     Review of Systems:  GENERAL:  Feels"not like herself".  Feels "hot and cold".  Intermittent sweats.  No weight loss. PERFORMANCE STATUS (ECOG):  1 HEENT:  Saw ophthalmologist this morning.  No visual changes, runny nose, sore throat, mouth sores or tenderness. Lungs: No shortness of breath or cough.  No hemoptysis. Cardiac:  Intermittent chest pain/pressure (see HPI).  No palpitations, orthopnea, or PND. GI:  Poor appetite.  Constipation.  Daily nausea.  No vomiting, diarrhea, constipation, melena or hematochezia. GU:  No urgency, frequency, dysuria, or hematuria. Musculoskeletal:  Arthritis in knee.  Chronic arthralgias secondary to osteoarthritis.  No muscle tenderness. Extremities:  No pain or swelling. Skin:  No rashes or skin changes.  No after bath itching. Neuro:   Headache.  Feels weak.  Dizzy at times (? Secondary to metoprolol.  No focal numbness or weakness, balance or coordination issues. Endocrine:  No diabetes, thyroid issues, hot flashes or night sweats. Psych:  No mood changes, depression or anxiety. Pain:  No focal pain. Review of systems:  All other systems reviewed and found to be negative.  Physical Exam: Blood pressure (!) 142/63, pulse (!) 57, temperature 97.9 F (36.6 C), temperature source Tympanic, resp. rate 18, height _0  (1.549 m), weight 159 lb 2.8 oz (72.2 kg), SpO2 100 %. GENERAL:  Well  developed, well nourished, woman sitting comfortably in the exam room in no acute distress. MENTAL STATUS:  Alert and oriented to person, place and time. HEAD: Curly dark hair with graying.  Normocephalic, atraumatic, face symmetric, no Cushingoid features. EYES:  Brown eyes.  Slight left amblyopia.  Pupils dilated s/p eye appointment.  Pupils equal round.  No conjunctivitis or scleral icterus. ENT:  Oropharynx clear without lesion.  Tongue normal. Mucous membranes moist.  RESPIRATORY:  Clear to auscultation without rales, wheezes or rhonchi. CARDIOVASCULAR:  Regular rate and rhythm without murmur, rub or gallop. ABDOMEN:  Soft, non-tender, with active bowel sounds, and no hepatosplenomegaly.  No masses. SKIN:  No rashes, ulcers or lesions. EXTREMITIES: No edema, no skin discoloration or tenderness.  No palpable cords. LYMPH NODES: No palpable cervical, supraclavicular, axillary or inguinal adenopathy  NEUROLOGICAL: Unremarkable. PSYCH:  Appropriate.   No visits with results within 3 Day(s) from this visit.  Latest known visit with results is:  Admission on 01/27/2018, Discharged on 01/27/2018  Component Date Value Ref Range Status  . Sodium 01/27/2018 138  135 - 145 mmol/L Final  . Potassium 01/27/2018 4.0  3.5 - 5.1 mmol/L Final  . Chloride 01/27/2018 104  98 - 111 mmol/L Final  . CO2 01/27/2018 27  22 - 32 mmol/L Final  . Glucose, Bld 01/27/2018 99  70 - 99 mg/dL Final  . BUN 01/27/2018 10  6 - 20 mg/dL Final  . Creatinine, Ser 01/27/2018 0.70  0.44 - 1.00 mg/dL Final  . Calcium 01/27/2018 9.1  8.9 - 10.3 mg/dL Final  . Total Protein 01/27/2018 7.5  6.5 - 8.1 g/dL Final  . Albumin 01/27/2018 4.1  3.5 - 5.0 g/dL Final  . AST 01/27/2018 21  15 - 41 U/L Final  . ALT 01/27/2018 12  0 - 44 U/L Final  . Alkaline Phosphatase 01/27/2018 57  38 - 126 U/L Final  . Total Bilirubin 01/27/2018 0.8  0.3 - 1.2 mg/dL Final  . GFR calc non Af Amer 01/27/2018 >60  >60 mL/min  Final  . GFR calc Af  Amer 01/27/2018 >60  >60 mL/min Final  . Anion gap 01/27/2018 7  5 - 15 Final   Performed at [facility], [address] [place], [place] [address]  . Lipase 01/27/2018 23  11 - 51 U/L Final   Performed at [facility], [address] [place], [place] [address]  . B Natriuretic Peptide 01/27/2018 44.0  0.0 - 100.0 pg/mL Final   Performed at [facility], [address] [place], [place] [address]  . Troponin I 01/27/2018 <0.03  <0.03 ng/mL Final   Performed at [facility], [address] [place], [place] [address]  . WBC 01/27/2018 8.4  4.0 - 10.5 K/uL Final  . RBC 01/27/2018 4.19  3.87 - 5.11 MIL/uL Final  . Hemoglobin 01/27/2018 12.7  12.0 - 15.0 g/dL Final  . HCT 01/27/2018 39.9  36.0 - 46.0 % Final  . MCV 01/27/2018 95.2  80.0 - 100.0 fL Final  . MCH 01/27/2018 30.3  26.0 - 34.0 pg Final  . MCHC 01/27/2018 31.8  30.0 - 36.0 g/dL Final  . RDW 01/27/2018 12.3  11.5 - 15.5 % Final  . Platelets 01/27/2018 333  150 - 400 K/uL Final  . nRBC 01/27/2018 0.0  0.0 - 0.2 % Final  . Neutrophils Relative % 01/27/2018 63  % Final  . Neutro Abs 01/27/2018 5.4  1.7 - 7.7 K/uL Final  . Lymphocytes Relative 01/27/2018 26  % Final  . Lymphs Abs 01/27/2018 2.2  0.7 - 4.0 K/uL Final  . Monocytes Relative 01/27/2018 7  % Final  . Monocytes Absolute 01/27/2018 0.6  0.1 - 1.0 K/uL Final  . Eosinophils Relative 01/27/2018 3  % Final  . Eosinophils Absolute 01/27/2018 0.2  0.0 - 0.5 K/uL Final  . Basophils Relative 01/27/2018 1  % Final  . Basophils Absolute 01/27/2018 0.0  0.0 - 0.1 K/uL Final  . Immature Granulocytes 01/27/2018 0  % Final  . Abs Immature Granulocytes 01/27/2018 0.03  0.00 - 0.07 K/uL Final   Performed at [facility], [address] [place], [place] [address]    Assessment:  Carmen Grimes is a 58 y.o. female with a lingular lesion and a left lower lobe lesion.  She is s/p electromagnetic navigation bronchoscopy (ENB) of the LUL  on 12/12/2017.  Pathology was benign.  She had a pneumothorax after her biopsy.  CEA and LDH were normal on 02/01/2018.  Chest CT on 11/11/2017 revealed a 1.5 x 2.2 cm nodule within the lingula which was thought to represent primary pulmonary malignancy or potentially focal infectious process.  PET scan on 11/21/2017 revealed hypermetabolic lingular opacity similar in size to 11/11/2017.  There was an anterior left lower lobe hypermetabolic opacity, increased compared to 11/11/2017. This short-term progression, as well as morphology, may represent progressive infection.  Neoplasia, especially in the lingula, cannot be excluded.    Chest CT angiogram at [facility] on 01/10/2018 revealed no pulmonary embolism.  There was a 3.2 x 1.7 cm soft tissue mass adjacent to the pericardium by the left ventricle concerning for malignancy. There was left lingular atelectasis possibly with some mucoid impaction.  Chest CT without contrast at [facility] on 01/20/2018 revealed a 2.5 x 1.0 more flattened lingular nodule (compared to 2.2 x 1.5 cm) on the CT dated 11/11/2017.  The nodule in the anterior aspect of the LEFT lower lobe had increased in size to 3.2 x 1.7 cm (previously 2.9 x 1.9  cm) on PET-CT 11/22/2017.  There were no pathologically enlarged mediastinal or hilar lymph nodes.  Symptomatically, she feels weak, has intermittent chest pain, diaphoresis, and feels hot and cold.  Exam is unremarkable.  Plan: 1.  Labs today:  LDH, uric acid, CEA. 2.  Lingular and left lower lobe lesion  Review imaging with patient.  Suspect left lower lobe lesion originates in lung rather than pericardium.  If lung cancer T4 lesion.  Discuss obtaining tissue for diagnosis and treatment.  Present at tumor board on 02/02/2018.  Follow-up with Dr Mortimer Fries on 02/03/2018 as planned.    Contact Dr. Mortimer Fries and Dr Genevive Bi- done.  Anticipate biopsy (? CT guided). 3.  Contact patient after tumor board regarding tumor board discussions and  plans for biopsy.   Honor Loh, NP  02/01/2018, 12:03 PM   I saw and evaluated the patient, participating in the key portions of the service and reviewing pertinent diagnostic studies and records.  I reviewed the nurse practitioner's note and agree with the findings and the plan.  The assessment and plan were discussed with the patient.  Multiple questions were asked by the patient and answered.   Nolon Stalls, MD 02/01/2018,12:03 PM

## 2018-02-02 ENCOUNTER — Other Ambulatory Visit: Payer: Self-pay | Admitting: *Deleted

## 2018-02-02 DIAGNOSIS — R490 Dysphonia: Secondary | ICD-10-CM | POA: Insufficient documentation

## 2018-02-02 LAB — CEA: CEA: 1.4 ng/mL (ref 0.0–4.7)

## 2018-02-02 NOTE — Progress Notes (Signed)
Tumor Board Documentation  Carmen Grimes was presented by Dr Mike Gip at our Tumor Board on 02/02/2018, which included representatives from medical oncology, radiation oncology, surgical, radiology, pathology, navigation, internal medicine, palliative care, research, genetics, pharmacy, pulmonology.  Carmen Grimes currently presents as a new patient, for discussion, for Carmen Grimes with history of the following treatments: active survellience.  Additionally, we reviewed previous medical and familial history, history of present illness, and recent lab results along with all available histopathologic and imaging studies. The tumor board considered available treatment options and made the following recommendations:   refer to cardiology fro eval of nodule close to heart  The following procedures/referrals were also placed: No orders of the defined types were placed in this encounter.   Clinical Trial Status: not discussed   Staging used: AJCC Stage Group  Tumor board is a meeting of clinicians from various specialty areas who evaluate and discuss patients for whom a multidisciplinary approach is being considered. Final determinations in the plan of care are those of the provider(s). The responsibility for follow up of recommendations given during tumor board is that of the provider.   Today's extended care, comprehensive team conference, Carmen Grimes was not present for the discussion and was not examined.   Multidisciplinary Tumor Board is a multidisciplinary case peer review process.  Decisions discussed in the Multidisciplinary Tumor Board reflect the opinions of the specialists present at the conference without having examined the patient.  Ultimately, treatment and diagnostic decisions rest with the primary provider(s) and the patient.

## 2018-02-03 ENCOUNTER — Ambulatory Visit (INDEPENDENT_AMBULATORY_CARE_PROVIDER_SITE_OTHER): Payer: Medicare Other | Admitting: Internal Medicine

## 2018-02-03 ENCOUNTER — Encounter: Payer: Self-pay | Admitting: Intensive Care

## 2018-02-03 ENCOUNTER — Other Ambulatory Visit: Payer: Self-pay

## 2018-02-03 ENCOUNTER — Encounter: Payer: Self-pay | Admitting: Internal Medicine

## 2018-02-03 ENCOUNTER — Inpatient Hospital Stay
Admission: EM | Admit: 2018-02-03 | Discharge: 2018-02-04 | DRG: 313 | Disposition: A | Discharge status: Home / Self Care | Payer: Medicare Other | Attending: Family Medicine | Admitting: Family Medicine

## 2018-02-03 ENCOUNTER — Emergency Department: Payer: Medicare Other

## 2018-02-03 ENCOUNTER — Ambulatory Visit: Admission: RE | Admit: 2018-02-03 | Payer: Medicare Other | Source: Ambulatory Visit

## 2018-02-03 VITALS — BP 120/70 | HR 85 | Resp 16 | Ht 61.0 in | Wt 160.0 lb

## 2018-02-03 DIAGNOSIS — E7800 Pure hypercholesterolemia, unspecified: Secondary | ICD-10-CM | POA: Diagnosis present

## 2018-02-03 DIAGNOSIS — I10 Essential (primary) hypertension: Secondary | ICD-10-CM | POA: Diagnosis present

## 2018-02-03 DIAGNOSIS — J45909 Unspecified asthma, uncomplicated: Secondary | ICD-10-CM | POA: Diagnosis present

## 2018-02-03 DIAGNOSIS — Z91013 Allergy to seafood: Secondary | ICD-10-CM

## 2018-02-03 DIAGNOSIS — R911 Solitary pulmonary nodule: Secondary | ICD-10-CM

## 2018-02-03 DIAGNOSIS — I479 Paroxysmal tachycardia, unspecified: Secondary | ICD-10-CM | POA: Diagnosis not present

## 2018-02-03 DIAGNOSIS — Z91018 Allergy to other foods: Secondary | ICD-10-CM | POA: Diagnosis not present

## 2018-02-03 DIAGNOSIS — K219 Gastro-esophageal reflux disease without esophagitis: Secondary | ICD-10-CM | POA: Diagnosis not present

## 2018-02-03 DIAGNOSIS — R232 Flushing: Secondary | ICD-10-CM

## 2018-02-03 DIAGNOSIS — Z885 Allergy status to narcotic agent status: Secondary | ICD-10-CM

## 2018-02-03 DIAGNOSIS — R1013 Epigastric pain: Secondary | ICD-10-CM

## 2018-02-03 DIAGNOSIS — I2510 Atherosclerotic heart disease of native coronary artery without angina pectoris: Secondary | ICD-10-CM

## 2018-02-03 DIAGNOSIS — Z91012 Allergy to eggs: Secondary | ICD-10-CM

## 2018-02-03 DIAGNOSIS — C7A8 Other malignant neuroendocrine tumors: Secondary | ICD-10-CM | POA: Diagnosis not present

## 2018-02-03 DIAGNOSIS — E785 Hyperlipidemia, unspecified: Secondary | ICD-10-CM | POA: Diagnosis present

## 2018-02-03 DIAGNOSIS — Z90711 Acquired absence of uterus with remaining cervical stump: Secondary | ICD-10-CM

## 2018-02-03 DIAGNOSIS — R0789 Other chest pain: Secondary | ICD-10-CM | POA: Diagnosis present

## 2018-02-03 DIAGNOSIS — R079 Chest pain, unspecified: Secondary | ICD-10-CM | POA: Diagnosis not present

## 2018-02-03 DIAGNOSIS — R030 Elevated blood-pressure reading, without diagnosis of hypertension: Secondary | ICD-10-CM

## 2018-02-03 DIAGNOSIS — R918 Other nonspecific abnormal finding of lung field: Secondary | ICD-10-CM | POA: Diagnosis not present

## 2018-02-03 DIAGNOSIS — Z87891 Personal history of nicotine dependence: Secondary | ICD-10-CM | POA: Diagnosis not present

## 2018-02-03 DIAGNOSIS — J988 Other specified respiratory disorders: Secondary | ICD-10-CM | POA: Diagnosis not present

## 2018-02-03 DIAGNOSIS — Z888 Allergy status to other drugs, medicaments and biological substances status: Secondary | ICD-10-CM

## 2018-02-03 DIAGNOSIS — Z79899 Other long term (current) drug therapy: Secondary | ICD-10-CM

## 2018-02-03 HISTORY — DX: Nonrheumatic mitral (valve) prolapse: I34.1

## 2018-02-03 HISTORY — DX: Essential (primary) hypertension: I10

## 2018-02-03 LAB — BASIC METABOLIC PANEL
Anion gap: 9 (ref 5–15)
BUN: 12 mg/dL (ref 6–20)
CO2: 27 mmol/L (ref 22–32)
Calcium: 9.7 mg/dL (ref 8.9–10.3)
Chloride: 102 mmol/L (ref 98–111)
Creatinine, Ser: 0.73 mg/dL (ref 0.44–1.00)
GFR calc Af Amer: >60 mL/min (ref >60)
GFR calc non Af Amer: >60 mL/min (ref >60)
Glucose, Bld: 89 mg/dL (ref 70–99)
POTASSIUM: 4 mmol/L (ref 3.5–5.1)
Sodium: 138 mmol/L (ref 135–145)

## 2018-02-03 LAB — CBC WITH DIFFERENTIAL/PLATELET
Abs Immature Granulocytes: 0.03 10*3/uL (ref 0.00–0.07)
Basophils Absolute: 0.1 10*3/uL (ref 0.0–0.1)
Basophils Relative: 1 %
Eosinophils Absolute: 0.3 10*3/uL (ref 0.0–0.5)
Eosinophils Relative: 4 %
HCT: 40.1 % (ref 36.0–46.0)
Hemoglobin: 12.8 g/dL (ref 12.0–15.0)
Immature Granulocytes: 0 %
Lymphocytes Relative: 23 %
Lymphs Abs: 2 10*3/uL (ref 0.7–4.0)
MCH: 29.8 pg (ref 26.0–34.0)
MCHC: 31.9 g/dL (ref 30.0–36.0)
MCV: 93.5 fL (ref 80.0–100.0)
Monocytes Absolute: 0.5 10*3/uL (ref 0.1–1.0)
Monocytes Relative: 6 %
Neutro Abs: 5.8 10*3/uL (ref 1.7–7.7)
Neutrophils Relative %: 66 %
Platelets: 339 10*3/uL (ref 150–400)
RBC: 4.29 MIL/uL (ref 3.87–5.11)
RDW: 12.4 % (ref 11.5–15.5)
WBC: 8.7 10*3/uL (ref 4.0–10.5)
nRBC: 0 % (ref 0.0–0.2)

## 2018-02-03 LAB — SEDIMENTATION RATE: SED RATE: 16 mm/h (ref 0–30)

## 2018-02-03 LAB — LIPID PANEL
CHOLESTEROL: 287 mg/dL — AB (ref 0–200)
HDL: 85 mg/dL (ref >40)
LDL Cholesterol: 136 mg/dL — ABNORMAL HIGH (ref 0–99)
Total CHOL/HDL Ratio: 3.4 RATIO
Triglycerides: 329 mg/dL — ABNORMAL HIGH (ref <150)
VLDL: 66 mg/dL — ABNORMAL HIGH (ref 0–40)

## 2018-02-03 LAB — TROPONIN I
Troponin I: <0.03 ng/mL (ref <0.03)
Troponin I: <0.03 ng/mL (ref <0.03)

## 2018-02-03 LAB — TSH: TSH: 1.543 u[IU]/mL (ref 0.350–4.500)

## 2018-02-03 MED ORDER — ALBUTEROL SULFATE (2.5 MG/3ML) 0.083% IN NEBU: 3.0000 mL | INHALATION_SOLUTION | Freq: Four times a day (QID) | RESPIRATORY_TRACT | Status: DC | PRN

## 2018-02-03 MED ORDER — ENOXAPARIN SODIUM 40 MG/0.4ML ~~LOC~~ SOLN
40.0000 mg | SUBCUTANEOUS | Status: DC
  Filled 2018-02-03: qty 0.4

## 2018-02-03 MED ORDER — ESTRADIOL 1 MG PO TABS
2.0000 mg | ORAL_TABLET | Freq: Every day | ORAL | Status: DC
  Administered 2018-02-04: 2 mg via ORAL
  Filled 2018-02-03: qty 2

## 2018-02-03 MED ORDER — ONDANSETRON HCL 4 MG PO TABS: 4.0000 mg | ORAL_TABLET | Freq: Four times a day (QID) | ORAL | Status: DC | PRN

## 2018-02-03 MED ORDER — ONDANSETRON HCL 4 MG/2ML IJ SOLN: 4.0000 mg | Freq: Four times a day (QID) | INTRAMUSCULAR | Status: DC | PRN

## 2018-02-03 MED ORDER — ACETAMINOPHEN 325 MG PO TABS: 650.0000 mg | ORAL_TABLET | Freq: Four times a day (QID) | ORAL | Status: DC | PRN

## 2018-02-03 MED ORDER — IOPAMIDOL (ISOVUE-300) INJECTION 61%
100.0000 mL | Freq: Once | INTRAVENOUS | Status: AC | PRN
  Administered 2018-02-03: 100 mL via INTRAVENOUS

## 2018-02-03 MED ORDER — SODIUM CHLORIDE 0.9% FLUSH
3.0000 mL | Freq: Once | INTRAVENOUS | Status: AC
  Administered 2018-02-03: 3 mL via INTRAVENOUS

## 2018-02-03 MED ORDER — ACETAMINOPHEN 650 MG RE SUPP: 650.0000 mg | Freq: Four times a day (QID) | RECTAL | Status: DC | PRN

## 2018-02-03 MED ORDER — SODIUM CHLORIDE 0.9 % IV SOLN
INTRAVENOUS | Status: DC
  Administered 2018-02-03 – 2018-02-04 (×2): via INTRAVENOUS

## 2018-02-03 MED ORDER — METOPROLOL TARTRATE 25 MG PO TABS
25.0000 mg | ORAL_TABLET | Freq: Two times a day (BID) | ORAL | Status: DC
  Administered 2018-02-04: 25 mg via ORAL
  Filled 2018-02-03 (×2): qty 1

## 2018-02-03 MED ORDER — IOPAMIDOL (ISOVUE-300) INJECTION 61%
30.0000 mL | Freq: Once | INTRAVENOUS | Status: AC | PRN
  Administered 2018-02-03: 30 mL via ORAL

## 2018-02-03 MED ORDER — ZOLPIDEM TARTRATE 5 MG PO TABS
5.0000 mg | ORAL_TABLET | Freq: Every evening | ORAL | Status: DC | PRN
  Administered 2018-02-03: 5 mg via ORAL
  Filled 2018-02-03: qty 1

## 2018-02-03 NOTE — ED Provider Notes (Signed)
[facility] Emergency Department Provider Note   ____________________________________________   First MD Initiated Contact with Patient 02/03/18 0957     (approximate)  I have reviewed the triage vital signs and the nursing notes.   HISTORY  Chief Complaint Chest Pain    HPI Carmen Grimes is a 58 y.o. female who was recently diagnosed with a mass up against her heart.  She had biopsy done which resulted in a pneumothorax and required a chest tube.  She has been having episodes of tachycardia high blood pressure and chest pain which is usually in the epigastric area but sometimes radiates into the jaw or the neck or the shoulder and sometimes involves the whole left side of the chest.  This kind of deep achy pain.  She saw Dr. Mortimer Fries today and had some chest pain was sent to the ER again.  She has had an abdominal ultrasound ordered and is planned to get a CT of the abdomen with contrast from what I understand in an effort to check for carcinoid.  We will attempt to get these here.  Past Medical History:  Diagnosis Date  . Asthma    controlled  . Dyspnea 12/2017   difficulty getting her breath at all times  . GERD (gastroesophageal reflux disease)   . H/O: GI bleed   . Hypertension   . Mitral valve prolapse   . Osteoarthritis    knees    Patient Active Problem List   Diagnosis Date Noted  . H/O chest tube placement   . Pneumothorax 12/12/2017  . Lung nodule   . Degeneration of lumbar intervertebral disc 12/05/2017  . Cervical spondylosis without myelopathy 06/26/2017  . 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 Synovitis of right hand 02/01/2017  . H/O: GI bleed   . Osteoarthritis   . GERD (gastroesophageal reflux disease)   . Asthma   . Vitamin D deficiency 07/16/2016  . Anxiety 07/12/2016  . Acute pain of left shoulder 03/17/2016  . Impingement syndrome of left shoulder 03/17/2016  . Right hip pain 12/19/2015  . Primary  osteoarthritis of right knee 12/05/2015  . Lichen planus 23/30/0762  . Chest pain 01/13/2015  . Left forearm pain 06/08/2014  . Acute bilateral low back pain with bilateral sciatica 05/16/2014  . Breast pain, left 05/16/2014  . Cervical radiculopathy 05/16/2014  . Abnormal weight gain 02/22/2014  . Cervical pain (neck) 02/22/2014  . Fatigue 02/22/2014  . Hair loss 02/22/2014  . Muscle spasm 02/22/2014  . Mild intermittent asthma 11/12/2013    Past Surgical History:  Procedure Laterality Date  . ABDOMINAL HYSTERECTOMY  2002  . ANTERIOR FUSION CERVICAL SPINE  2009  . COLONOSCOPY  2013   normal  . ELECTROMAGNETIC NAVIGATION BROCHOSCOPY Left 12/12/2017   Procedure: ELECTROMAGNETIC NAVIGATION BRONCHOSCOPY;  Surgeon: Flora Lipps, MD;  Location: [facility] ORS;  Service: Cardiopulmonary;  Laterality: Left;  . ESOPHAGOGASTRODUODENOSCOPY  1990   GI bleed  . FLEXIBLE BRONCHOSCOPY Left 12/12/2017   Procedure: FLEXIBLE BRONCHOSCOPY;  Surgeon: Flora Lipps, MD;  Location: [facility] ORS;  Service: Cardiopulmonary;  Laterality: Left;  . KNEE ARTHROSCOPY Right 2010   x3  . KNEE SURGERY Left    Arthroscopy x1  . LAPAROSCOPIC SUPRACERVICAL HYSTERECTOMY  2002   with BSO for fibroids/ AUB/ pelvic pain  . LAPAROSCOPIC TUBAL LIGATION  1982  . SHOULDER SURGERY Right    rotator cuff repair  . SHOULDER SURGERY Right 2011   second rotator cuff repair  . tubal reanastamosis  1985    Prior to Admission medications   Medication Sig Start Date End Date Taking? Authorizing Provider  Biotin 10000 MCG TABS Take 1 tablet by mouth daily.    [provider]  Cholecalciferol (VITAMIN D3) 50000 units CAPS TAKE ONE CAPSULE BY MOUTH ONE TIME PER WEEK 03/04/17   [provider]  estradiol (ESTRACE) 2 MG tablet Take 1 tablet (2 mg total) by mouth daily. 02/17/17   Dalia Heading, CNM  metoprolol tartrate (LOPRESSOR) 50 MG tablet Take by mouth.    [provider]  Misc Natural Products  (GLUCOSAMINE CHOND COMPLEX/MSM PO) Take 1 capsule by mouth daily.     [provider]  ondansetron (ZOFRAN-ODT) 8 MG disintegrating tablet TAKE 1 TABLET BY MOUTH EVERY 4 TO 6 HOURS AS NEEDED 11/30/17   [provider]  VENTOLIN HFA 108 (90 Base) MCG/ACT inhaler Inhale 2 puffs into the lungs every 6 (six) hours as needed.  11/14/17   [provider]    Allergies Shellfish allergy; Tramadol; Ascorbic acid; Celecoxib; Codeine; Gabapentin; Hydrocodone; Hydrocodone-acetaminophen; Ibuprofen; Naproxen; Prednisone; Singulair [montelukast]; Strawberry extract; Doxycycline; Eggs or egg-derived products; Latex; Nsaids; Strawberry flavor; and Sulfamethoxazole-trimethoprim  Family History  Problem Relation Age of Onset  . Diabetes Mother   . Dementia Mother        Alzheimers  . Hypertension Mother   . Glaucoma Mother   . Prostate cancer Father 26  . Stroke Father   . Arthritis Father   . Osteoporosis Father   . Prostate cancer Brother   . Breast cancer Neg Hx     Social History Social History   Tobacco Use  . Smoking status: Former Smoker    Packs/day: 1.00    Years: 13.00    Pack years: 13.00  . Smokeless tobacco: Never Used  Substance Use Topics  . Alcohol use: Not Currently  . Drug use: No    Review of Systems  Constitutional: No fever/chills she has been feeling hot and cold and gets sweaty on occasion different from her hot flashes Eyes: No visual changes. ENT: No sore throat. Cardiovascular: See HPI Respiratory: Denies shortness of breath. Gastrointestinal: No abdominal pain.  No nausea, no vomiting.  No diarrhea.  No constipation. Genitourinary: Negative for dysuria. Musculoskeletal: Negative for back pain. Skin: Negative for rash. Neurological: Negative for headaches, focal weakness   ____________________________________________   PHYSICAL EXAM:  VITAL SIGNS: ED Triage Vitals  Enc Vitals Group     BP 02/03/18 0944 133/84     Pulse Rate  02/03/18 0944 73     Resp 02/03/18 0944 16     Temp 02/03/18 0944 98.6 F (37 C)     Temp Source 02/03/18 0944 Oral     SpO2 02/03/18 0944 98 %     Weight 02/03/18 0945 160 lb (72.6 kg)     Height 02/03/18 0945 _0  (1.549 m)     Head Circumference --      Peak Flow --      Pain Score 02/03/18 0945 0     Pain Loc --      Pain Edu? --      Excl. in [place]? --     Constitutional: Alert and oriented. Well appearing and in no acute distress. Eyes: Conjunctivae are normal.  Head: Atraumatic. Nose: No congestion/rhinnorhea. Mouth/Throat: Mucous membranes are moist.  Oropharynx non-erythematous. Neck: No stridor.   Cardiovascular: Normal rate, regular rhythm. Grossly normal heart sounds.  Good peripheral circulation. Respiratory: Normal respiratory effort.  No retractions. Lungs CTAB. Gastrointestinal: Soft and nontender. No distention. No abdominal bruits. No CVA tenderness. Musculoskeletal: No lower extremity tenderness nor edema. Neurologic:  Normal speech and language. No gross focal neurologic deficits are appreciated.  Skin:  Skin is warm, dry and intact. No rash noted. Psychiatric: Mood and affect are normal. Speech and behavior are normal.  ____________________________________________   LABS (all labs ordered are listed, but only abnormal results are displayed)  Labs Reviewed  BASIC METABOLIC PANEL  TROPONIN I  SEDIMENTATION RATE  CBC WITH DIFFERENTIAL/PLATELET   ____________________________________________  EKG  EKG read and interpreted by me shows normal sinus rhythm rate of 69 normal axis EKG is really unchanged from 01/27/2018 ____________________________________________  RADIOLOGY  ED MD interpretation: X-ray read by radiology reviewed by me shows no acute abnormalities  Official radiology report(s): Dg Chest 2 View  Result Date: 02/03/2018 CLINICAL DATA:  Chest pain. EXAM: CHEST - 2 VIEW COMPARISON:  01/27/2018 and 01/08/2018 FINDINGS: The heart size and  pulmonary vascularity are normal. No pneumothorax. Nodular density persists in the lingula, unchanged. Tiny new area of atelectasis at the left base laterally. Right lung is clear. IMPRESSION: No acute abnormalities.  Stable pulmonary nodule in the lingula. Electronically Signed   By: Lorriane Shire M.D.   On: 02/03/2018 10:42   Ct Abdomen Pelvis W Contrast  Result Date: 02/03/2018 CLINICAL DATA:  Chest and upper abdominal pain with nausea for 1 month EXAM: CT ABDOMEN AND PELVIS WITH CONTRAST TECHNIQUE: Multidetector CT imaging of the abdomen and pelvis was performed using the standard protocol following bolus administration of intravenous contrast. Sagittal and coronal MPR images reconstructed from axial data set. CONTRAST:  183m ISOVUE-300 IOPAMIDOL (ISOVUE-300) INJECTION 61% IV. COMPARISON:  PET-CT 11/22/2017 FINDINGS: Lower chest: Partial visualization of a soft tissue mass adjacent to the LEFT heart border, incompletely assessed on image 1, visualized portion 3.5 x 1.3 cm increased since prior study; this was hypermetabolic on prior PET-CT. Remaining lung bases clear Hepatobiliary: Contracted gallbladder.  Liver normal appearance Pancreas: Normal Spleen: Normal Adrenals/Urinary Tract: Adrenal glands normal. Cyst at upper pole RIGHT kidney 2.5 x 1.9 cm image 18 previously 2.1 cm greatest size. Additional 10 mm cyst at upper pole LEFT kidney not clearly identified on previous exam. Kidneys, ureters, and bladder otherwise normal appearance. Stomach/Bowel: Normal appendix. Stomach distended by contrast and food debris. Large and small bowel loops unremarkable Vascular/Lymphatic: Scattered pelvic phleboliths. Aorta normal caliber. No adenopathy. Reproductive: By history post hysterectomy but there appears to be retained cervix with nabothian cyst. Potentially an atrophic LEFT ovary is visualized. Other: No free air or free fluid. No hernia or acute inflammatory process. Musculoskeletal: Disc space narrowing  L4-L5 with bulging disc flattening thecal sac. Diffusely bulging disc at L5-S1 as well questionably exerting mass effect upon the RIGHT lateral recess. IMPRESSION: Again identified lingular mass adjacent to LEFT heart border 3.5 x 1.3 cm, slightly increased in size and incompletely visualized at lung base. LEFT renal cysts. No acute intra-abdominal or intrapelvic abnormalities. Degenerative disc disease changes at lower lumbar spine as above. Electronically Signed   By: MLavonia DanaM.D.   On: 02/03/2018 12:09    ____________________________________________   PROCEDURES  Procedure(s) performed:   Procedures  Critical Care performed:   ____________________________________________   INITIAL IMPRESSION / ASSESSMENT AND PLAN / ED COURSE  Discussed with Dr. KMortimer Frieshe wants to get her admitted to check her for carcinoid.  I completely agree.  Dr. KTressia Minerswill come down and check on her.  ____________________________________________   FINAL CLINICAL IMPRESSION(S) / ED DIAGNOSES  Final diagnoses:  Chest pain, unspecified type  Flushing  Elevated blood pressure reading     ED Discharge Orders    None       Note:  This document was prepared using Dragon voice recognition software and may include unintentional dictation errors.    Nena Polio, MD 02/03/18 1242

## 2018-02-03 NOTE — Consult Note (Signed)
Cardiology Consultation:   Patient ID: Carmen Grimes MRN: [id number]; DOB: [birth year]  Admit date: 02/03/2018 Date of Consult: 02/03/2018  Primary Care Provider: Perrin Maltese, MD Primary Cardiologist: Previously Dr. Humphrey Rolls, [facility] requested Primary Electrophysiologist:  None    Patient Profile:   Carmen Grimes is a 58 y.o. female with a hx of recently diagnosed thoracic mass pressing against her heart, HTN, HLD, MVP, history of GI bleed, GERD, remote h/o smoking (18 year pack history of smoking, quit 1985), COPD with asthma, lung nodule, left shoulder impingement syndrome, OA, and anxiety who is being seen today for the evaluation of ongoing chest pain in setting of chest mass at the request of Dr. Tressia Miners.  History of Present Illness:   Carmen Grimes is a 58 yo female with PMH as above and family history of cardiac disease.  Of note, she also has multiple allergies.  12/14/2015, she presented to the ED with chest pain that was TTP and described as sharp and stabbing, radiating through to her back, and lasting 10 minutes.  She experienced emesis shortly thereafter.  She went to the emergency room with subsequent negative cardiac enzymes and EKG.  She was discharged home.  Seen by Dr. Lyda Kalata of [facility] on 01/13/2015.  She stated at that time she had experienced daily chest pain since her ED visit.  She described the chest pain as in the center of her chest and radiating through to her back.  It occasionally radiated up to the shoulder and arm.  It was intermittent throughout the day and did not seem to be triggered by exercise.  She did describe some associated shortness of breath palpitations, and lower extremity edema.  She also described some gas and GERD symptoms.  She stated she snored but did not think she had sleep apnea.  At that time, the plan was for stress testing, which was negative, low risk.  It was noted she may benefit from treatment for possible reflux or gallbladder  disease.  She also had high LDL cholesterol and it was noted she would benefit from statin therapy.  LDL 158, HDL 93.  EKG showed NSR, borderline T wave abnormalities.  2018 sleep study performed and located in EMR under procedures and negative for OSA. Lifestyle changes recommended.  10/2017: She developed SOB, productive cough, chills, sweats, and fatigue. She received 3 courses abx. CXR 11/02/2017 revealed 1.5x2.6c, nodule in the lingula worrisome for malignancy. 11/11/2017 PET scan revealed hypermetabolic lingular opacity.  This was similar in size to 11/11/2017.  Concurrent anterior left lower lobe hypermetabolic opacity, increased compared to 11/11/2017.  It was thought that this short-term progression and morphology may represent aggressive infection.  Neoplasia, especially in the lingula, was not excluded.  12/06/17 CT scan by pulmonology noted anterior LLL mass was pressing against her heart. It was also noted previously seen lingular mass. She subsequently had a navigational bronchoscopy and sampling of LUL 12/12/17  that resulted in left lung pneumothorax, requiring chest tube.  On 12/13/2017, her chest tube was removed without issues and she was discharged home.  Pathology indicated sample /biopsy as benign.  Since 01/07/2018, the patient reported she has had almost daily intermittent episodes of chest pain with multiple ED visits ([facility]).  This chest pain is described as intermittent pressure, at times sharp, and occurring in the center of her chest.  Each episode lasts for several seconds.  At times, she also has pressure in her jaw and neck.  The patient stated that this is "  a different type of chest pain," than pervious episodes. Per patient, the first episode of this type of chest pain started while she was driving to drop off her son at the [employer] and was associated with rapid heart rate, dizziness, and nausea. She stated the chest pain went into her left shoulder blade and bilateral  neck, as well as down both of her arms.    01/10/2018 chest CT angiogram at [facility] revealed no pulmonary embolism.  There was a 3.2 x 1.7 cm soft tissue mass adjacent to the pericardium by the left ventricle concerning for malignancy. There was felt to be left lingular atelectasis possibly with some mucoid impaction. Chest CT without contrast at [facility] on 01/20/2018 revealed a 2.5 x 1.0 more flattened lingular nodule (compared to 2.2 x 1.5 cm) on the CT dated 11/11/2017.  The nodule in the anterior aspect of the LEFT lower lobe had increased in size to 3.2 x 1.7 cm (previously 2.9 x 1.9 cm) on PET-CT 11/22/2017.  There were no pathologically enlarged mediastinal or hilar lymph nodes.  1/24: She presented to [facility] ED 01/27/2018 with chest pain and underwent stress testing below. Dr. Humphrey Rolls ordered CTA.   1/29: Office visit with oncology, at which time she reported headaches, weakness, and vertigo symptoms since starting metoprolol.  She reportedly plan to see her PCP to discuss discontinuing this medication.  She denied increased shortness of breath.  She did again note feeling cold, keeping her Apt. 80 degrees.  She said she was recently treated for atypical pneumonia.  She denied any cough or fever.  She noted she often felt too weak to get out of bed, and was afraid of experiencing syncope and collapse.  Of note, and per documentation from this visit, she described her chest pain at that time as intermittent but not radiating into the shoulder, scapula, or neck/jaw.  She reported frequent episodes of nausea and vomiting at that time.  She also reported chronic arthralgias secondary to known OA.  She denied abdominal pain and urinary symptoms.  02/03/2018, she had another CP, associated with nausea. She (same day) was scheduled to see Pulmonology. She presented to Pulmonology, and from there, she was sent to the ED. Per EMR, pulmonology noted that that her anterior LLL mass and lingular mass had both  decreased in size (over the last 4 months). It was therefore thought that she should therefore go to the ED for further cardiac workup / assessment, including possible echo and/or cardiac MRI given her continued c/o intermittent chest pain.  In the ED, patient c/o elevated HR, elevated BP, flushing, intermittent diaphoresis, (hot/cold) chills, decreased appetite, lower chest pain/tightness, and upper epigastric pain that occasionally radiated into the left jaw, bilateral neck, and L shoulder and arm. She denied changes in bowel movements.  She reported she is a strict vegan. Ddx per EMR at that time was noted to include carcinoid tumor with suggestion for labs and repeat CT in 3 months. ED workup showed BP slightly elevated at 133/84, labs non-contributory, EKG was without acute changes and NSR 69 bpm.  CXR again identified lingular mass adjacent to left heart border 3.5 x 1.7 cm, slightly increased in size and incompletely visualized at left lung base.  Left renal cyst.  No acute intra-abdominal or intrapelvic abnormalities. Abdominal ultrasound and CT abdomen were ordered.    Past Medical History:  Diagnosis Date  . Asthma    controlled  . Dyspnea 12/2017   difficulty getting her breath  at all times  . GERD (gastroesophageal reflux disease)   . H/O: GI bleed   . Hypertension   . Mitral valve prolapse   . Osteoarthritis    knees    Past Surgical History:  Procedure Laterality Date  . ABDOMINAL HYSTERECTOMY  2002  . ANTERIOR FUSION CERVICAL SPINE  2009  . COLONOSCOPY  2013   normal  . ELECTROMAGNETIC NAVIGATION BROCHOSCOPY Left 12/12/2017   Procedure: ELECTROMAGNETIC NAVIGATION BRONCHOSCOPY;  Surgeon: Flora Lipps, MD;  Location: [facility] ORS;  Service: Cardiopulmonary;  Laterality: Left;  . ESOPHAGOGASTRODUODENOSCOPY  1990   GI bleed  . FLEXIBLE BRONCHOSCOPY Left 12/12/2017   Procedure: FLEXIBLE BRONCHOSCOPY;  Surgeon: Flora Lipps, MD;  Location: [facility] ORS;  Service: Cardiopulmonary;   Laterality: Left;  . KNEE ARTHROSCOPY Right 2010   x3  . KNEE SURGERY Left    Arthroscopy x1  . LAPAROSCOPIC SUPRACERVICAL HYSTERECTOMY  2002   with BSO for fibroids/ AUB/ pelvic pain  . LAPAROSCOPIC TUBAL LIGATION  1982  . SHOULDER SURGERY Right    rotator cuff repair  . SHOULDER SURGERY Right 2011   second rotator cuff repair  . tubal reanastamosis  1985     Home Medications:  Prior to Admission medications   Medication Sig Start Date End Date Taking? Authorizing Provider  Biotin 10000 MCG TABS Take 1 tablet by mouth daily.   Yes [provider]  Cholecalciferol (VITAMIN D3) 50000 units CAPS TAKE ONE CAPSULE BY MOUTH ONE TIME PER WEEK 03/04/17  Yes [provider]  estradiol (ESTRACE) 2 MG tablet Take 1 tablet (2 mg total) by mouth daily. 02/17/17  Yes Dalia Heading, CNM  metoprolol tartrate (LOPRESSOR) 50 MG tablet Take by mouth.   Yes [provider]  Misc Natural Products (GLUCOSAMINE CHOND COMPLEX/MSM PO) Take 1 capsule by mouth daily.    Yes [provider]  VENTOLIN HFA 108 (90 Base) MCG/ACT inhaler Inhale 2 puffs into the lungs every 6 (six) hours as needed.  11/14/17  Yes [provider]  carvedilol (COREG) 6.25 MG tablet Take 6.25 mg by mouth 2 (two) times daily. 02/01/18   [provider]  ondansetron (ZOFRAN-ODT) 8 MG disintegrating tablet TAKE 1 TABLET BY MOUTH EVERY 4 TO 6 HOURS AS NEEDED 11/30/17   [provider]    Inpatient Medications: Scheduled Meds:  Continuous Infusions:  PRN Meds:   Allergies:    Allergies  Allergen Reactions  . Shellfish Allergy Anaphylaxis    Other reaction(s): anaphylaxis/angioedema Other reaction(s): anaphylaxis/angioedema  Betadine on skin is okay  . Tramadol Other (See Comments)    "blacked out", found wandering in the street  . Ascorbic Acid Hives and Nausea And Vomiting    HIVES / VOMITING   . Celecoxib Other (See Comments)    GI bleed Other reaction(s):  gi distress, Other (comments), Other (See Comments) GI BLEED       . Gabapentin Other (See Comments)    Drowsiness. Like a zombie, not functional   . Hydrocodone Other (See Comments)    Hair falls out.  Marland Kitchen Hydrocodone-Acetaminophen     Other reaction(s): other/intolerance HAIR FALLS OUT   . Ibuprofen Other (See Comments)    "Blood disorder", GI bleed. Other reaction(s): gi distress, Other (comments), Other (See Comments)   . Naproxen Other (See Comments)    GI bleed Other reaction(s): Other (comments), Other (See Comments)        . Prednisone Other (See Comments)    Tremors, severe  . Singulair [Montelukast] Other (  See Comments)    Syncope..passed out  . Strawberry Extract Hives, Nausea And Vomiting and Nausea Only    Severe vomitting  . Codeine Itching and Nausea Only    Other reaction(s): mild rash/itching  . Doxycycline Other (See Comments)    Unknown reaction. Other reaction(s): Other (See Comments), Unknown, Unknown (comments) Pt does not remember   . Eggs Or Egg-Derived Products Rash and Other (See Comments)    rash  . Latex Rash and Itching    Other reaction(s): mild rash/itching  . Nsaids Nausea And Vomiting and Other (See Comments)    Gastric bleed        . Strawberry Flavor Hives  . Sulfamethoxazole-Trimethoprim Other (See Comments)    Other reaction(s): Other (See Comments), other/intolerance MAKES PAIN WORSE  Increases pain Increases pain     Social History:   Social History   Socioeconomic History  . Marital status: Divorced    Spouse name: Not on file  . Number of children: 3  . Years of education: Not on file  . Highest education level: Not on file  Occupational History  . Occupation: CMA and phlebotomist    Comment: not working but would like to  Social Needs  . Financial resource strain: Not on file  . Food insecurity:    Worry: Not on file    Inability: Not on file  . Transportation needs:    Medical: Not on file     Non-medical: Not on file  Tobacco Use  . Smoking status: Former Smoker    Packs/day: 1.00    Years: 13.00    Pack years: 13.00  . Smokeless tobacco: Never Used  Substance and Sexual Activity  . Alcohol use: Not Currently  . Drug use: No  . Sexual activity: Not Currently    Birth control/protection: Post-menopausal  Lifestyle  . Physical activity:    Days per week: 5 days    Minutes per session: 50 min  . Stress: Not at all  Relationships  . Social connections:    Talks on phone: More than three times a week    Gets together: More than three times a week    Attends religious service: More than 4 times per year    Active member of club or organization: No    Attends meetings of clubs or organizations: Never    Relationship status: Divorced  . Intimate partner violence:    Fear of current or ex partner: No    Emotionally abused: No    Physically abused: No    Forced sexual activity: No  Other Topics Concern  . Not on file  Social History Narrative  . Not on file    Family History:    Family History  Problem Relation Age of Onset  . Diabetes Mother   . Dementia Mother        Alzheimers  . Hypertension Mother   . Glaucoma Mother   . Prostate cancer Father 79  . Stroke Father   . Arthritis Father   . Osteoporosis Father   . Prostate cancer Brother   . Breast cancer Neg Hx      ROS:  Please see the history of present illness.   Review of Systems  Constitutional: Positive for diaphoresis and malaise/fatigue.       Hot and cold, "sweaty"  Cardiovascular: Positive for chest pain, palpitations and leg swelling. Negative for orthopnea and PND.       Intermittent episodes of racing HR  Gastrointestinal:  Positive for abdominal pain, constipation and nausea. Negative for diarrhea and vomiting.       No diarrhea. Stated constipation with metoprolol. Normally goes 3-4 times daily.  Musculoskeletal: Positive for back pain and neck pain.       Bilateral neck pain. Chest  pain radiates through to between shoulder blades  Neurological: Positive for dizziness and weakness.  Psychiatric/Behavioral: The patient is nervous/anxious.        Anxious that "she might be having a heart attack because of her mass on her heart."  All other systems reviewed and are negative.   All other ROS reviewed and negative.     Physical Exam/Data:   Vitals:   02/03/18 0944 02/03/18 0945  BP: 133/84   Pulse: 73   Resp: 16   Temp: 98.6 F (37 C)   TempSrc: Oral   SpO2: 98%   Weight:  72.6 kg  Height:  _0  (1.549 m)   No intake or output data in the 24 hours ending 02/03/18 1301 Filed Weights   02/03/18 0945  Weight: 72.6 kg   Body mass index is 30.23 kg/m.  General:  Well nourished, well developed, tearful at times HEENT: normal Neck: difficult to assess d/t body habitus Vascular: Radial pulses 2+ bilaterally  Cardiac:  normal S1, S2; RRR; no murmurs Lungs:  clear to auscultation bilaterally, no wheezing, rhonchi or rales  Abd: soft, nontender, no hepatomegaly  Ext: no bilateral lower extremity edema Musculoskeletal:  No deformities Skin: warm and dry  Neuro:  CNs 2-12 intact, no focal abnormalities noted Psych:  Normal affect   EKG:  NSR, 69 bpm, QRS 70, QT 380, borderline / minimal voltage criteria for LVH noted.Nonspecific changes noted with slight depression in leads II, 3, aVF; trivial elevation V1, aVL depression in V5 V6, and nonspecific T wave abnormality in V5-V6/lateral leads  Telemetry:  Telemetry was personally reviewed and demonstrates:  NSR  CV Studies:   Relevant CV Studies:  01-17-15 NM Study CONCLUSION:  THE PATIENT EXERCISED TO STAGE III ON BRUCE PROTOCOL TO HEART RATE OF  160 PER MINUTE, 98 %  MAXIMAL PREDICTED HEART RATE.  ECHO SHOWED NORMAL WALL MOTION AND GLOBAL SYSTOLIC FUNCTION AT REST AND AFTER  STRESS/EXERCISE WITH APPROPRIATE AUGMENTATION OF FUNCTION IN ALL SEGMENTS.  LOW PROBABILITY OF ISCHEMIA.  Laboratory  Data:  Chemistry Recent Labs  Lab 02/03/18 1011  NA 138  K 4.0  CL 102  CO2 27  GLUCOSE 89  BUN 12  CREATININE 0.73  CALCIUM 9.7  GFRNONAA >60  GFRAA >60  ANIONGAP 9    No results for input(s): PROT, ALBUMIN, AST, ALT, ALKPHOS, BILITOT in the last 168 hours. Hematology Recent Labs  Lab 02/03/18 1030  WBC 8.7  RBC 4.29  HGB 12.8  HCT 40.1  MCV 93.5  MCH 29.8  MCHC 31.9  RDW 12.4  PLT 339   Cardiac Enzymes Recent Labs  Lab 02/03/18 1011  TROPONINI <0.03   No results for input(s): TROPIPOC in the last 168 hours.  BNPNo results for input(s): BNP, PROBNP in the last 168 hours.  DDimer No results for input(s): DDIMER in the last 168 hours.  Radiology/Studies:  Dg Chest 2 View  Result Date: 02/03/2018 CLINICAL DATA:  Chest pain. EXAM: CHEST - 2 VIEW COMPARISON:  01/27/2018 and 01/08/2018 FINDINGS: The heart size and pulmonary vascularity are normal. No pneumothorax. Nodular density persists in the lingula, unchanged. Tiny new area of atelectasis at the left base laterally. Right lung is clear.  IMPRESSION: No acute abnormalities.  Stable pulmonary nodule in the lingula. Electronically Signed   By: Lorriane Shire M.D.   On: 02/03/2018 10:42   Ct Abdomen Pelvis W Contrast  Result Date: 02/03/2018 CLINICAL DATA:  Chest and upper abdominal pain with nausea for 1 month EXAM: CT ABDOMEN AND PELVIS WITH CONTRAST TECHNIQUE: Multidetector CT imaging of the abdomen and pelvis was performed using the standard protocol following bolus administration of intravenous contrast. Sagittal and coronal MPR images reconstructed from axial data set. CONTRAST:  136m ISOVUE-300 IOPAMIDOL (ISOVUE-300) INJECTION 61% IV. COMPARISON:  PET-CT 11/22/2017 FINDINGS: Lower chest: Partial visualization of a soft tissue mass adjacent to the LEFT heart border, incompletely assessed on image 1, visualized portion 3.5 x 1.3 cm increased since prior study; this was hypermetabolic on prior PET-CT. Remaining lung  bases clear Hepatobiliary: Contracted gallbladder.  Liver normal appearance Pancreas: Normal Spleen: Normal Adrenals/Urinary Tract: Adrenal glands normal. Cyst at upper pole RIGHT kidney 2.5 x 1.9 cm image 18 previously 2.1 cm greatest size. Additional 10 mm cyst at upper pole LEFT kidney not clearly identified on previous exam. Kidneys, ureters, and bladder otherwise normal appearance. Stomach/Bowel: Normal appendix. Stomach distended by contrast and food debris. Large and small bowel loops unremarkable Vascular/Lymphatic: Scattered pelvic phleboliths. Aorta normal caliber. No adenopathy. Reproductive: By history post hysterectomy but there appears to be retained cervix with nabothian cyst. Potentially an atrophic LEFT ovary is visualized. Other: No free air or free fluid. No hernia or acute inflammatory process. Musculoskeletal: Disc space narrowing L4-L5 with bulging disc flattening thecal sac. Diffusely bulging disc at L5-S1 as well questionably exerting mass effect upon the RIGHT lateral recess. IMPRESSION: Again identified lingular mass adjacent to LEFT heart border 3.5 x 1.3 cm, slightly increased in size and incompletely visualized at lung base. LEFT renal cysts. No acute intra-abdominal or intrapelvic abnormalities. Degenerative disc disease changes at lower lumbar spine as above. Electronically Signed   By: MLavonia DanaM.D.   On: 02/03/2018 12:09    Assessment and Plan:   Chest Pain in setting of known lung mass  - As in HPI, long and complex history of chest pain in setting of known thoracic / lung masses with workup per oncology, pulmonology, Dr. KHumphrey Rolls and [facility] as detailed above.  In addition to lingual mass, large anterior left lung mass near heart (as in HPI) with concern for carcinoid.  - Troponin negative x3, EKG with nonspecific changes and t wave abnormalities as noted above. TSH, K, non-contributory. Renal function stable. -Etiology of chest pain likely related to lung mass, which is  pending further work-up. - Will plan to continue lopressor as below for intermittent tachycardia and consistently elevated BP.  Of note, patient reports past side effects as above to metoprolol, but seems asx on current lopressor 25 mg bid.  HR 53 and bradycardic, BP133/76. Could consider reducing lopressor if bradycardia persists. If side effects to lopressor, could also consider changing to alternate agents including amlodipine.  - No plans further ischemic work-up or invasive ischemic evaluation this admission.  HTN - BP most recently 144/86. If carcinoid mass, could be a contributing factor.  Started on Lopressor as above. HR low. If side effects to this medication, as previously reported, or if continued bradycardia, could consider other agents including amlodipine as above.    HLD - LDL elevated in past. Ordered updated lipid panel. Updated LDL shows LDL 136. PTA medications do not include a statin. Labs show lIver function is stable. Consider starting on  statin as outpatient per PCP with follow up labs ~8 weeks.   Lung mass - Per oncology, will recommend further workup via [facility]. See above attestation for further details regarding this workup. Labs ordered to workup for possible carcinoid etiology  Per oncology include 5 HIAA urine 24h and catecholamines, as well as chromogranin A and metanephrines   For questions or updates, please contact [facility] Please consult [email or web address] for contact info under     Signed, Arvil Chaco, PA-C  02/03/2018 1:01 PM

## 2018-02-03 NOTE — H&P (Signed)
[facility] [place] NAME: Carmen Grimes    MR#:  [id number]  DATE OF BIRTH:  Oct 04, 1960  DATE OF ADMISSION:  02/03/2018  PRIMARY CARE PHYSICIAN: Perrin Maltese, MD   REQUESTING/REFERRING PHYSICIAN: Dr. Conni Slipper  CHIEF COMPLAINT:   Chief Complaint  Patient presents with  . Chest Pain    HISTORY OF PRESENT ILLNESS:  Carmen Grimes  is a 58 y.o. female with a known history of GERD, hypertension, mitral valve prolapse brought in from pulmonary office secondary to ongoing chest pain for almost 4 weeks now.  Patient first noted to have a left lower lobe nodular opacification either mucous plugging or mass in November 2019.  Patient had a baseline biopsy done which turned out to be nonmalignant, however it was complicated by pneumothorax that resolved with chest tube placement.  The lingular mass has since been decreasing in size.  Patient's current symptoms started on January 07, 2018 when she was driving home she started feeling pain in her chest radiating to her back worsening with deep breathing and movement, also associated with palpitations, nausea and diaphoresis.  Since then she has had multiple ER visits here and also at [facility] with similar complaints.  She states it happens almost every day and fluctuates and sometimes associated with palpitations and flushing and diaphoresis.  She was seeing cardiology in the office as an outpatient and had Holter monitor done, stress test done and CT coronary angiogram done which did not show any coronary blockages.  During this work-up, repeat CT of the chest recently showed a 3 x 2 cm mass in the left lower lobe anterior part of the lung close to the heart. She was sent in from pulmonary office today because of ongoing symptoms.  Labs are within normal limits, troponin is negative.  CT of the abdomen otherwise did not show any new acute findings.  PAST MEDICAL HISTORY:   Past Medical History:    Diagnosis Date  . Asthma    controlled  . Dyspnea 12/2017   difficulty getting her breath at all times  . GERD (gastroesophageal reflux disease)   . H/O: GI bleed   . Hypertension   . Mitral valve prolapse   . Osteoarthritis    knees    PAST SURGICAL HISTORY:   Past Surgical History:  Procedure Laterality Date  . ABDOMINAL HYSTERECTOMY  2002  . ANTERIOR FUSION CERVICAL SPINE  2009  . COLONOSCOPY  2013   normal  . ELECTROMAGNETIC NAVIGATION BROCHOSCOPY Left 12/12/2017   Procedure: ELECTROMAGNETIC NAVIGATION BRONCHOSCOPY;  Surgeon: Flora Lipps, MD;  Location: [facility] ORS;  Service: Cardiopulmonary;  Laterality: Left;  . ESOPHAGOGASTRODUODENOSCOPY  1990   GI bleed  . FLEXIBLE BRONCHOSCOPY Left 12/12/2017   Procedure: FLEXIBLE BRONCHOSCOPY;  Surgeon: Flora Lipps, MD;  Location: [facility] ORS;  Service: Cardiopulmonary;  Laterality: Left;  . KNEE ARTHROSCOPY Right 2010   x3  . KNEE SURGERY Left    Arthroscopy x1  . LAPAROSCOPIC SUPRACERVICAL HYSTERECTOMY  2002   with BSO for fibroids/ AUB/ pelvic pain  . LAPAROSCOPIC TUBAL LIGATION  1982  . SHOULDER SURGERY Right    rotator cuff repair  . SHOULDER SURGERY Right 2011   second rotator cuff repair  . tubal reanastamosis  1985    SOCIAL HISTORY:   Social History   Tobacco Use  . Smoking status: Former Smoker    Packs/day: 1.00    Years: 13.00    Pack years: 13.00  .  Smokeless tobacco: Never Used  Substance Use Topics  . Alcohol use: Not Currently    FAMILY HISTORY:   Family History  Problem Relation Age of Onset  . Diabetes Mother   . Dementia Mother        Alzheimers  . Hypertension Mother   . Glaucoma Mother   . Prostate cancer Father 67  . Stroke Father   . Arthritis Father   . Osteoporosis Father   . Prostate cancer Brother   . Breast cancer Neg Hx     DRUG ALLERGIES:   Allergies  Allergen Reactions  . Shellfish Allergy Anaphylaxis    Other reaction(s): anaphylaxis/angioedema Other reaction(s):  anaphylaxis/angioedema  Betadine on skin is okay  . Tramadol Other (See Comments)    "blacked out", found wandering in the street  . Ascorbic Acid Hives and Nausea And Vomiting    HIVES / VOMITING   . Celecoxib Other (See Comments)    GI bleed Other reaction(s): gi distress, Other (comments), Other (See Comments) GI BLEED       . Gabapentin Other (See Comments)    Drowsiness. Like a zombie, not functional   . Hydrocodone Other (See Comments)    Hair falls out.  Marland Kitchen Hydrocodone-Acetaminophen     Other reaction(s): other/intolerance HAIR FALLS OUT   . Ibuprofen Other (See Comments)    "Blood disorder", GI bleed. Other reaction(s): gi distress, Other (comments), Other (See Comments)   . Naproxen Other (See Comments)    GI bleed Other reaction(s): Other (comments), Other (See Comments)        . Prednisone Other (See Comments)    Tremors, severe  . Singulair [Montelukast] Other (See Comments)    Syncope..passed out  . Strawberry Extract Hives, Nausea And Vomiting and Nausea Only    Severe vomitting  . Codeine Itching and Nausea Only    Other reaction(s): mild rash/itching  . Doxycycline Other (See Comments)    Unknown reaction. Other reaction(s): Other (See Comments), Unknown, Unknown (comments) Pt does not remember   . Eggs Or Egg-Derived Products Rash and Other (See Comments)    rash  . Latex Rash and Itching    Other reaction(s): mild rash/itching  . Nsaids Nausea And Vomiting and Other (See Comments)    Gastric bleed        . Strawberry Flavor Hives  . Sulfamethoxazole-Trimethoprim Other (See Comments)    Other reaction(s): Other (See Comments), other/intolerance MAKES PAIN WORSE  Increases pain Increases pain     REVIEW OF SYSTEMS:   Review of Systems  Constitutional: Positive for diaphoresis. Negative for chills, fever, malaise/fatigue and weight loss.  HENT: Negative for ear discharge, ear pain, hearing loss, nosebleeds and tinnitus.     Eyes: Negative for blurred vision, double vision and photophobia.  Respiratory: Negative for cough, hemoptysis, shortness of breath and wheezing.   Cardiovascular: Positive for chest pain and palpitations. Negative for orthopnea and leg swelling.  Gastrointestinal: Negative for abdominal pain, constipation, diarrhea, heartburn, melena, nausea and vomiting.  Genitourinary: Negative for dysuria, frequency, hematuria and urgency.  Musculoskeletal: Negative for back pain, myalgias and neck pain.  Skin: Negative for rash.  Neurological: Negative for dizziness, tingling, tremors, sensory change, speech change, focal weakness and headaches.  Endo/Heme/Allergies: Does not bruise/bleed easily.  Psychiatric/Behavioral: Negative for depression.    MEDICATIONS AT HOME:   Prior to Admission medications   Medication Sig Start Date End Date Taking? Authorizing Provider  Biotin 10000 MCG TABS Take 1 tablet by mouth daily.  Yes [provider]  Cholecalciferol (VITAMIN D3) 50000 units CAPS TAKE ONE CAPSULE BY MOUTH ONE TIME PER WEEK 03/04/17  Yes [provider]  estradiol (ESTRACE) 2 MG tablet Take 1 tablet (2 mg total) by mouth daily. 02/17/17  Yes Dalia Heading, CNM  metoprolol tartrate (LOPRESSOR) 50 MG tablet Take by mouth.   Yes [provider]  Misc Natural Products (GLUCOSAMINE CHOND COMPLEX/MSM PO) Take 1 capsule by mouth daily.    Yes [provider]  VENTOLIN HFA 108 (90 Base) MCG/ACT inhaler Inhale 2 puffs into the lungs every 6 (six) hours as needed.  11/14/17  Yes [provider]  carvedilol (COREG) 6.25 MG tablet Take 6.25 mg by mouth 2 (two) times daily. 02/01/18   [provider]  ondansetron (ZOFRAN-ODT) 8 MG disintegrating tablet TAKE 1 TABLET BY MOUTH EVERY 4 TO 6 HOURS AS NEEDED 11/30/17   [provider]      VITAL SIGNS:  Blood pressure 133/76, pulse (!) 53, temperature 97.6 F (36.4 C), temperature source Oral,  resp. rate 19, height _0  (1.626 m), weight 70.3 kg, SpO2 100 %.  PHYSICAL EXAMINATION:   Physical Exam  GENERAL:  58 y.o.-year-old patient lying in the bed with no acute distress.  EYES: Pupils equal, round, reactive to light and accommodation. No scleral icterus. Extraocular muscles intact.  HEENT: Head atraumatic, normocephalic. Oropharynx and nasopharynx clear.  NECK:  Supple, no jugular venous distention. No thyroid enlargement, no tenderness.  LUNGS: Normal breath sounds bilaterally, no wheezing, rales,rhonchi or crepitation. No use of accessory muscles of respiration.  CARDIOVASCULAR: S1, S2 normal. No murmurs, rubs, or gallops.  ABDOMEN: Soft, nontender, nondistended. Bowel sounds present. No organomegaly or mass.  EXTREMITIES: No pedal edema, cyanosis, or clubbing.  NEUROLOGIC: Cranial nerves II through XII are intact. Muscle strength 5/5 in all extremities. Sensation intact. Gait not checked.  PSYCHIATRIC: The patient is alert and oriented x 3.  SKIN: No obvious rash, lesion, or ulcer.   LABORATORY PANEL:   CBC Recent Labs  Lab 02/03/18 1030  WBC 8.7  HGB 12.8  HCT 40.1  PLT 339   ------------------------------------------------------------------------------------------------------------------  Chemistries  Recent Labs  Lab 02/03/18 1011  NA 138  K 4.0  CL 102  CO2 27  GLUCOSE 89  BUN 12  CREATININE 0.73  CALCIUM 9.7   ------------------------------------------------------------------------------------------------------------------  Cardiac Enzymes Recent Labs  Lab 02/03/18 1011  TROPONINI <0.03   ------------------------------------------------------------------------------------------------------------------  RADIOLOGY:  Dg Chest 2 View  Result Date: 02/03/2018 CLINICAL DATA:  Chest pain. EXAM: CHEST - 2 VIEW COMPARISON:  01/27/2018 and 01/08/2018 FINDINGS: The heart size and pulmonary vascularity are normal. No pneumothorax. Nodular density  persists in the lingula, unchanged. Tiny new area of atelectasis at the left base laterally. Right lung is clear. IMPRESSION: No acute abnormalities.  Stable pulmonary nodule in the lingula. Electronically Signed   By: Lorriane Shire M.D.   On: 02/03/2018 10:42   Ct Abdomen Pelvis W Contrast  Result Date: 02/03/2018 CLINICAL DATA:  Chest and upper abdominal pain with nausea for 1 month EXAM: CT ABDOMEN AND PELVIS WITH CONTRAST TECHNIQUE: Multidetector CT imaging of the abdomen and pelvis was performed using the standard protocol following bolus administration of intravenous contrast. Sagittal and coronal MPR images reconstructed from axial data set. CONTRAST:  11m ISOVUE-300 IOPAMIDOL (ISOVUE-300) INJECTION 61% IV. COMPARISON:  PET-CT 11/22/2017 FINDINGS: Lower chest: Partial visualization of a soft tissue mass adjacent to the LEFT heart border, incompletely assessed on image 1, visualized portion  3.5 x 1.3 cm increased since prior study; this was hypermetabolic on prior PET-CT. Remaining lung bases clear Hepatobiliary: Contracted gallbladder.  Liver normal appearance Pancreas: Normal Spleen: Normal Adrenals/Urinary Tract: Adrenal glands normal. Cyst at upper pole RIGHT kidney 2.5 x 1.9 cm image 18 previously 2.1 cm greatest size. Additional 10 mm cyst at upper pole LEFT kidney not clearly identified on previous exam. Kidneys, ureters, and bladder otherwise normal appearance. Stomach/Bowel: Normal appendix. Stomach distended by contrast and food debris. Large and small bowel loops unremarkable Vascular/Lymphatic: Scattered pelvic phleboliths. Aorta normal caliber. No adenopathy. Reproductive: By history post hysterectomy but there appears to be retained cervix with nabothian cyst. Potentially an atrophic LEFT ovary is visualized. Other: No free air or free fluid. No hernia or acute inflammatory process. Musculoskeletal: Disc space narrowing L4-L5 with bulging disc flattening thecal sac. Diffusely bulging disc  at L5-S1 as well questionably exerting mass effect upon the RIGHT lateral recess. IMPRESSION: Again identified lingular mass adjacent to LEFT heart border 3.5 x 1.3 cm, slightly increased in size and incompletely visualized at lung base. LEFT renal cysts. No acute intra-abdominal or intrapelvic abnormalities. Degenerative disc disease changes at lower lumbar spine as above. Electronically Signed   By: Lavonia Dana M.D.   On: 02/03/2018 12:09    EKG:   Orders placed or performed during the hospital encounter of 02/03/18  . ED EKG  . ED EKG    IMPRESSION AND PLAN:   Carmen Grimes  is a 58 y.o. female with a known history of GERD, hypertension, mitral valve prolapse brought in from pulmonary office secondary to ongoing chest pain for almost 4 weeks now.  1.  Chest pain-negative cardiac work-up including CT coronary angiogram and stress test which were done recently as outpatient.  Need to review records -Unsure what is causing the pain. -Cardiology consult requested. -Pain control recommended -Monitor on telemetry, recycle troponins  2.  Left lower lobe lung mass-given her symptoms of palpitations and flushing, concern for neuroendocrine tumor by pulmonary -Oncology consulted. -24-hour urine metanephrines, catecholamines and serum epinephrines, also chromogranin and 5-HIAA in the urine ordered  3.  Patient takes estradiol for postmenopausal symptoms  4.  DVT prophylaxis-on Lovenox    All the records are reviewed and case discussed with ED provider. Management plans discussed with the patient, family and they are in agreement.  CODE STATUS: Full Code  TOTAL TIME TAKING CARE OF THIS PATIENT: 51 minutes.    Gladstone Lighter M.D on 02/03/2018 at 2:23 PM  Between 7am to 6pm - Pager - [phone]  After 6pm go to [email or web address] - password [email or web address] [place] [facility]  Office  [phone]  CC: Primary care physician; Perrin Maltese, MD

## 2018-02-03 NOTE — ED Notes (Signed)
Unable to scan in [facility] in pt room, lab notified, white chart label used. Will call IT to place ticket.

## 2018-02-03 NOTE — ED Notes (Signed)
This RN to bedside, introduced self to patient, another warm blanket given by pt request. CT notified patient done with contrast at this time. Will continue to monitor for further patient needs.

## 2018-02-03 NOTE — ED Notes (Addendum)
Patient transported to room 250

## 2018-02-03 NOTE — Patient Instructions (Signed)
Check Chromogranin A level Check 5 HIAA 24 hr urine  Repeat CT chest in 3 months  obtain [facility] Cardiology Consultation in 1 week For chest pain

## 2018-02-03 NOTE — ED Notes (Signed)
Admitting MD at bedside at this time.

## 2018-02-03 NOTE — ED Notes (Signed)
Pt placed in bed, tearful, states she is tired of hurting and not getting answers. States Dr Mariea Clonts told her last week she had a mass on her heart, had never been told that before. Was thankful she told her-[facility] had not told her earlier this month. Has not been feeling well for a month now. Pain in bed this am in left breast, jaw, and left shoulder. States she wants to go on with her life but this pain is stopping her from doing what she wants, she lives alone.

## 2018-02-03 NOTE — Progress Notes (Signed)
Name: Carmen Grimes MRN: [id number] DOB: 09-Mar-1960     CONSULTATION DATE: 12.3.19 REFERRING MD : Dr. Genevive Bi  CHIEF COMPLAINT: Abnormal CT chest  STUDIES:    11/14/2017   CT chest Independently reviewed by Me today Interpretation abnormal CT chest with left lower lobe nodular opacification Likely mucous plugging pneumonia less likely cancer   1.17.20 CT chest I have Independently reviewed images of   Interpretation:left lower lobe opacification seems to be small in size    HISTORY OF PRESENT ILLNESS: Follow up CT chest reviewed in tumor Board conference Recommend Follow up CT chest in 3 months  Images reviewed with patient  Patient had underwent ENB complicated by PTX PTX resolved with time and chest tube placement  Lingular mass decreased in size Anterior left lower lobe mass next to heart also decreased in size  Patient with chest pain since since yesterday +increased HR +flushing    Review of Systems:  Gen:  +sweats, +hot and cold, +flushing HEENT: Denies blurred vision, double vision, ear pain, eye pain, hearing loss, nose bleeds, sore throat Cardiac:  +chest pain or heaviness, +chest tightness, Resp:   No cough, -sputum production, -shortness of breath,-wheezing, -hemoptysis,  Gi: Denies swallowing difficulty, stomach pain, nausea or vomiting, diarrhea, constipation, bowel incontinence Gu:  Denies bladder incontinence, burning urine Ext:   Denies Joint pain, stiffness or swelling Skin: Denies  skin rash, easy bruising or bleeding or hives Endoc:  Denies polyuria, polydipsia , polyphagia or weight change Psych:   Denies depression, insomnia or hallucinations  Other:  All other systems negative    Ht 5\' 1"  (1.549 m)   BMI 30.08 kg/m   BP 120/70 (BP Location: Left Arm, Cuff Size: Normal)   Pulse 85   Resp 16   Ht 5\' 1"  (1.549 m)   Wt 160 lb (72.6 kg)   SpO2 99%   BMI 30.23 kg/m   Physical Examination:   GENERAL:NAD, no fevers, chills, no  weakness no fatigue HEAD: Normocephalic, atraumatic.  EYES: Pupils equal, round, reactive to light. Extraocular muscles intact. No scleral icterus.  MOUTH: Moist mucosal membrane.   EAR, NOSE, THROAT: Clear without exudates. No external lesions.  NECK: Supple. No thyromegaly. No nodules. No JVD.  PULMONARY:CTA B/L no wheezes, no crackles, no rhonchi CARDIOVASCULAR: S1 and S2. Regular rate and rhythm. No murmurs, rubs, or gallops. No edema.  GASTROINTESTINAL: Soft, nontender, nondistended. No masses. Positive bowel sounds.  MUSCULOSKELETAL: No swelling, clubbing, or edema. Range of motion full in all extremities.  NEUROLOGIC: Cranial nerves II through XII are intact. No gross focal neurological deficits.  SKIN: No ulceration, lesions, rashes, or cyanosis. Skin warm and dry. Turgor intact.  PSYCHIATRIC: Mood, affect within normal limits. The patient is awake, alert and oriented x 3. Insight, judgment intact.      ASSESSMENT / PLAN:  Lung nodules(2 lesions)with elevated HR and flushing Consider Carcinoid tumor-check chromogannin A and 5 HIAA urine Repeat CT chest in 3 months Both spots have decreased in size over last 4 months   Chest pain-patient to be sent to ER for further assessment Recommend [facility] cardiology for assessment Need ECHO May need cardiac MRI  Oncology assessment-follow up needed   ASTHMA-no exacerbation at this time Well controlled with current inhaler regimen     Patient  satisfied with Plan of action and management. All questions answered  Corrin Parker, M.D.  [facility]  Medical Director [place] Director [facility] Cardio-Pulmonary Department

## 2018-02-03 NOTE — ED Notes (Signed)
Warm blanket given, pt appears in NAD.

## 2018-02-03 NOTE — Consult Note (Signed)
[facility]  Date of admission:  02/03/2018  Inpatient day:  02/03/2018  Consulting physician:  Dr. Gladstone Lighter   Reason for Consultation:  Lung mass  Chief Complaint: Carmen Grimes is a 58 y.o. female with lung nodules who was admitted through the emergency room with chest pain.  HPI:  The patient was seen in the medical oncology clinic on 02/01/2018 in consultation (please see note).  She had a lingular lesion and a left lower lobe lesion near the left ventricle.  She was s/p electromagnetic navigation bronchoscopy (ENB) of the LUL on 12/12/2017.  Pathology was benign.  She had a pneumothorax after her biopsy.  CEA and LDH were normal on 02/01/2018.  PET scan on 11/21/2017 revealed hypermetabolic lingular opacity similar in size to 11/11/2017.  There was an anterior left lower lobe hypermetabolic opacity, increased compared to 11/11/2017. This short-term progression, as well as morphology, may represent progressive infection.  Neoplasia, especially in the lingula, cannot be excluded.    Chest CT angiogram at [facility] on 01/10/2018 revealed no pulmonary embolism.  There was a 3.2 x 1.7 cm soft tissue mass adjacent to the pericardium by the left ventricle concerning for malignancy. There was left lingular atelectasis possibly with some mucoid impaction.  Chest CT without contrast at [facility] on 01/20/2018 revealed a 2.5 x 1.0 more flattened lingular nodule (compared to 2.2 x 1.5 cm) on the CT dated 11/11/2017.  The nodule in the anterior aspect of the LEFT lower lobe had increased in size to 3.2 x 1.7 cm (previously 2.9 x 1.9 cm) on PET-CT 11/22/2017.  There were no pathologically enlarged mediastinal or hilar lymph nodes.  I spoke with Dr. Chancy Milroy on 02/02/2018.  Echo on 01/16/2018 revealed an EF of 69% and a trivial effusion.  There was no cardiac involvement.  Lesion was outside of the parietal pericardium.  He felt that no additional imaging was  needed.  Symptomatically, she felt weak.  She was anxious.  She described intermittent chest pain, diaphoresis, and felt hot and cold.  Exam was unremarkable.  She was presented at tumor board on 02/02/2018.  Lesions were felt smaller.  Obtaining tissue was felt difficult.  Decision was made for repeat CT scan in 3 months.  She saw Dr. Mortimer Fries on 02/03/2018.  She was symptomatic with chest pain, increased heart rate, and flushing.  Concern was raised about possible carcinoid tumor.  Chromogranin A and 24 hour urine for 5HIAA was recommended.  She was referred to the ER.  ER evaluation included an EKG with NSR at 69.  CBC and BMP were normal.  TSH was normal.  Troponin was < 0.03.  CXR revealed no acute abnormalities.  The nodule in the lingula was stable.  Abdomen and pelvic CT revealed the 3.5 x 1.3 cm lingular mass adjacent to LEFT heart border, slightly increased in size and incompletely visualized at lung base.  There were LEFT renal cysts, no acute intra-abdominal or intrapelvic abnormalities, and degenerative disc disease changes at lower lumbar spine.  Symptomatically she states that she is quite anxious/nervous.  She is very concerned about what is going on.  She describes at times mid sternal chest pain that can go up into the left side of her jaw.  She denies any radiation into her left arm.  She feels hot and cold.  She describes feeling cold in her distal extremities from elbows to fingertips and knees to toes at times.  Sometimes these areas are cold to the  touch, other times they are not.  She describes feeling hot everywhere at times.  Associated with feeling warm, she becomes sweaty.  While in the room, she felt that warm sensation for a few seconds.  She was not flushed.  She denies any diarrhea.   Past Medical History:  Diagnosis Date  . Asthma    controlled  . Dyspnea 12/2017   difficulty getting her breath at all times  . GERD (gastroesophageal reflux disease)   . H/O: GI bleed    . Hypertension   . Mitral valve prolapse   . Osteoarthritis    knees    Past Surgical History:  Procedure Laterality Date  . ABDOMINAL HYSTERECTOMY  2002  . ANTERIOR FUSION CERVICAL SPINE  2009  . COLONOSCOPY  2013   normal  . ELECTROMAGNETIC NAVIGATION BROCHOSCOPY Left 12/12/2017   Procedure: ELECTROMAGNETIC NAVIGATION BRONCHOSCOPY;  Surgeon: Flora Lipps, MD;  Location: [facility] ORS;  Service: Cardiopulmonary;  Laterality: Left;  . ESOPHAGOGASTRODUODENOSCOPY  1990   GI bleed  . FLEXIBLE BRONCHOSCOPY Left 12/12/2017   Procedure: FLEXIBLE BRONCHOSCOPY;  Surgeon: Flora Lipps, MD;  Location: [facility] ORS;  Service: Cardiopulmonary;  Laterality: Left;  . KNEE ARTHROSCOPY Right 2010   x3  . KNEE SURGERY Left    Arthroscopy x1  . LAPAROSCOPIC SUPRACERVICAL HYSTERECTOMY  2002   with BSO for fibroids/ AUB/ pelvic pain  . LAPAROSCOPIC TUBAL LIGATION  1982  . SHOULDER SURGERY Right    rotator cuff repair  . SHOULDER SURGERY Right 2011   second rotator cuff repair  . tubal reanastamosis  1985    Family History  Problem Relation Age of Onset  . Diabetes Mother   . Dementia Mother        Alzheimers  . Hypertension Mother   . Glaucoma Mother   . Prostate cancer Father 58  . Stroke Father   . Arthritis Father   . Osteoporosis Father   . Prostate cancer Brother   . Breast cancer Neg Hx     Social History:  reports that she has quit smoking. She has a 13.00 pack-year smoking history. She has never used smokeless tobacco. She reports previous alcohol use. She reports that she does not use drugs.  She is a former 1 ppd smoker for 13 years. Former ETOH use when she was younger; none now. Lives in [place] since 2018; moved from [place].  Patient is a Building control surveyor.  She is planning on going to MRI school (she was to start in 01/2018). [employer]; adheres to strict vegan diet.  She has a dog and a cat.    Allergies:  Allergies  Allergen Reactions  .  Shellfish Allergy Anaphylaxis    Other reaction(s): anaphylaxis/angioedema Other reaction(s): anaphylaxis/angioedema  Betadine on skin is okay  . Tramadol Other (See Comments)    "blacked out", found wandering in the street  . Ascorbic Acid Hives and Nausea And Vomiting    HIVES / VOMITING   . Celecoxib Other (See Comments)    GI bleed Other reaction(s): gi distress, Other (comments), Other (See Comments) GI BLEED       . Gabapentin Other (See Comments)    Drowsiness. Like a zombie, not functional   . Hydrocodone Other (See Comments)    Hair falls out.  Marland Kitchen Hydrocodone-Acetaminophen     Other reaction(s): other/intolerance HAIR FALLS OUT   . Ibuprofen Other (See Comments)    "Blood disorder", GI bleed. Other reaction(s): gi distress, Other (comments), Other (  See Comments)   . Naproxen Other (See Comments)    GI bleed Other reaction(s): Other (comments), Other (See Comments)        . Prednisone Other (See Comments)    Tremors, severe  . Singulair [Montelukast] Other (See Comments)    Syncope..passed out  . Strawberry Extract Hives, Nausea And Vomiting and Nausea Only    Severe vomitting  . Codeine Itching and Nausea Only    Other reaction(s): mild rash/itching  . Doxycycline Other (See Comments)    Unknown reaction. Other reaction(s): Other (See Comments), Unknown, Unknown (comments) Pt does not remember   . Eggs Or Egg-Derived Products Rash and Other (See Comments)    rash  . Latex Rash and Itching    Other reaction(s): mild rash/itching  . Nsaids Nausea And Vomiting and Other (See Comments)    Gastric bleed        . Strawberry Flavor Hives  . Sulfamethoxazole-Trimethoprim Other (See Comments)    Other reaction(s): Other (See Comments), other/intolerance MAKES PAIN WORSE  Increases pain Increases pain     Medications Prior to Admission  Medication Sig Dispense Refill  . Biotin 10000 MCG TABS Take 1 tablet by mouth daily.    .  Cholecalciferol (VITAMIN D3) 50000 units CAPS TAKE ONE CAPSULE BY MOUTH ONE TIME PER WEEK  1  . estradiol (ESTRACE) 2 MG tablet Take 1 tablet (2 mg total) by mouth daily. 90 tablet 3  . metoprolol tartrate (LOPRESSOR) 50 MG tablet Take by mouth.    . Misc Natural Products (GLUCOSAMINE CHOND COMPLEX/MSM PO) Take 1 capsule by mouth daily.     . VENTOLIN HFA 108 (90 Base) MCG/ACT inhaler Inhale 2 puffs into the lungs every 6 (six) hours as needed.   6  . carvedilol (COREG) 6.25 MG tablet Take 6.25 mg by mouth 2 (two) times daily.    . ondansetron (ZOFRAN-ODT) 8 MG disintegrating tablet TAKE 1 TABLET BY MOUTH EVERY 4 TO 6 HOURS AS NEEDED  0    Review of Systems: GENERAL:  Feels  "not like herself".  Hot and cold feelings (see HPI).  No weight loss. PERFORMANCE STATUS (ECOG):  1 HEENT:  No visual changes, runny nose, sore throat, mouth sores or tenderness. Lungs: No shortness of breath or cough.  No hemoptysis. Cardiac:  Intermittent mid sternal chest pressure/pain.  Intermittent palpitations.  No orthopnea, or PND. GI:  Poor appetite.  Constipation.  Some nausea.  No vomiting, diarrhea, melena or hematochezia. GU:  No urgency, frequency, dysuria, or hematuria. Musculoskeletal:  No back pain.  Arthralgias due to osteoarthritis.  No muscle tenderness. Extremities:  No pain or swelling. Skin:  No rashes or skin changes.  No after bath itching. Neuro:  Feels weak and dizzy at times.  No headache, focal numbness or weakness, balance or coordination issues. Endocrine:  No diabetes, thyroid issues, hot flashes or night sweats. Psych:  Feels anxious and nervous.  Worried about health.  No depression. Pain:  Intermittent chest pain. Review of systems:  All other systems reviewed and found to be negative.  Physical Exam:  Blood pressure 133/76, pulse (!) 53, temperature 97.6 F (36.4 C), temperature source Oral, resp. rate 19, height _0  (1.626 m), weight 155 lb (70.3 kg), SpO2 100 %.  GENERAL:   Well developed, well nourished, woman sitting lying comfortably on the medical unit in no acute distress. MENTAL STATUS:  Alert and oriented to person, place and time. HEAD: Hair up in a wrap.  Normocephalic, atraumatic,  face symmetric, no Cushingoid features. EYES:  Brown eyes.  Pupils equal round and reactive to light and accomodation.  No conjunctivitis or scleral icterus. ENT:  Oropharynx clear without lesion.  Tongue normal. Mucous membranes moist.  RESPIRATORY:  Clear to auscultation without rales, wheezes or rhonchi. CARDIOVASCULAR:  Regular rate and rhythm without murmur, rub or gallop. ABDOMEN:  Soft, non-tender, with active bowel sounds, and no hepatosplenomegaly.  No masses. SKIN:  No rashes, ulcers or lesions. EXTREMITIES: No edema, no skin discoloration or tenderness.  No palpable cords. LYMPH NODES: No palpable cervical, supraclavicular, axillary or inguinal adenopathy  NEUROLOGICAL: Unremarkable. PSYCH:  Anxious.   Results for orders placed or performed during the hospital encounter of 02/03/18 (from the past 48 hour(s))  Basic metabolic panel     Status: None   Collection Time: 02/03/18 10:11 AM  Result Value Ref Range   Sodium 138 135 - 145 mmol/L   Potassium 4.0 3.5 - 5.1 mmol/L   Chloride 102 98 - 111 mmol/L   CO2 27 22 - 32 mmol/L   Glucose, Bld 89 70 - 99 mg/dL   BUN 12 6 - 20 mg/dL   Creatinine, Ser 0.73 0.44 - 1.00 mg/dL   Calcium 9.7 8.9 - 10.3 mg/dL   GFR calc non Af Amer >60 >60 mL/min   GFR calc Af Amer >60 >60 mL/min   Anion gap 9 5 - 15    Comment: Performed at [facility], [address] [place], [place] [address]  Troponin I - ONCE - STAT     Status: None   Collection Time: 02/03/18 10:11 AM  Result Value Ref Range   Troponin I <0.03 <0.03 ng/mL    Comment: Performed at [facility], [address] [place], [place] [address]  Sedimentation rate     Status: None   Collection Time: 02/03/18 10:11 AM  Result Value Ref Range    Sed Rate 16 0 - 30 mm/hr    Comment: Performed at [facility], [address] [place], [place] [address]  CBC with Differential/Platelet     Status: None   Collection Time: 02/03/18 10:30 AM  Result Value Ref Range   WBC 8.7 4.0 - 10.5 K/uL   RBC 4.29 3.87 - 5.11 MIL/uL   Hemoglobin 12.8 12.0 - 15.0 g/dL   HCT 40.1 36.0 - 46.0 %   MCV 93.5 80.0 - 100.0 fL   MCH 29.8 26.0 - 34.0 pg   MCHC 31.9 30.0 - 36.0 g/dL   RDW 12.4 11.5 - 15.5 %   Platelets 339 150 - 400 K/uL   nRBC 0.0 0.0 - 0.2 %   Neutrophils Relative % 66 %   Neutro Abs 5.8 1.7 - 7.7 K/uL   Lymphocytes Relative 23 %   Lymphs Abs 2.0 0.7 - 4.0 K/uL   Monocytes Relative 6 %   Monocytes Absolute 0.5 0.1 - 1.0 K/uL   Eosinophils Relative 4 %   Eosinophils Absolute 0.3 0.0 - 0.5 K/uL   Basophils Relative 1 %   Basophils Absolute 0.1 0.0 - 0.1 K/uL   Immature Granulocytes 0 %   Abs Immature Granulocytes 0.03 0.00 - 0.07 K/uL    Comment: Performed at [facility], [address] [place],  [address]  Lipid panel     Status: Abnormal   Collection Time: 02/03/18  2:10 PM  Result Value Ref Range   Cholesterol 287 (H) 0 - 200 mg/dL   Triglycerides 329 (H) <150  mg/dL   HDL 85 >40 mg/dL   Total CHOL/HDL Ratio 3.4 RATIO   VLDL 66 (H) 0 - 40 mg/dL   LDL Cholesterol 136 (H) 0 - 99 mg/dL    Comment:        Total Cholesterol/HDL:CHD Risk Coronary Heart Disease Risk Table                     Men   Women  1/2 Average Risk   3.4   3.3  Average Risk       5.0   4.4  2 X Average Risk   9.6   7.1  3 X Average Risk  23.4   11.0        Use the calculated Patient Ratio above and the CHD Risk Table to determine the patient's CHD Risk.        ATP III CLASSIFICATION (LDL):  <100     mg/dL   Optimal  100-129  mg/dL   Near or Above                    Optimal  130-159  mg/dL   Borderline  160-189  mg/dL   High  >190     mg/dL   Very High Performed at [facility], [address]  [place], [place] [address]   TSH     Status: None   Collection Time: 02/03/18  2:10 PM  Result Value Ref Range   TSH 1.543 0.350 - 4.500 uIU/mL    Comment: Performed by a 3rd Generation assay with a functional sensitivity of <=0.01 uIU/mL. Performed at [facility], [address] [place], [place] [address]   Troponin I - Now Then Q6H     Status: None   Collection Time: 02/03/18  2:10 PM  Result Value Ref Range   Troponin I <0.03 <0.03 ng/mL    Comment: Performed at [facility], [address], [place], [place] [address]   Dg Chest 2 View  Result Date: 02/03/2018 CLINICAL DATA:  Chest pain. EXAM: CHEST - 2 VIEW COMPARISON:  01/27/2018 and 01/08/2018 FINDINGS: The heart size and pulmonary vascularity are normal. No pneumothorax. Nodular density persists in the lingula, unchanged. Tiny new area of atelectasis at the left base laterally. Right lung is clear. IMPRESSION: No acute abnormalities.  Stable pulmonary nodule in the lingula. Electronically Signed   By: Lorriane Shire M.D.   On: 02/03/2018 10:42   Ct Abdomen Pelvis W Contrast  Result Date: 02/03/2018 CLINICAL DATA:  Chest and upper abdominal pain with nausea for 1 month EXAM: CT ABDOMEN AND PELVIS WITH CONTRAST TECHNIQUE: Multidetector CT imaging of the abdomen and pelvis was performed using the standard protocol following bolus administration of intravenous contrast. Sagittal and coronal MPR images reconstructed from axial data set. CONTRAST:  143m ISOVUE-300 IOPAMIDOL (ISOVUE-300) INJECTION 61% IV. COMPARISON:  PET-CT 11/22/2017 FINDINGS: Lower chest: Partial visualization of a soft tissue mass adjacent to the LEFT heart border, incompletely assessed on image 1, visualized portion 3.5 x 1.3 cm increased since prior study; this was hypermetabolic on prior PET-CT. Remaining lung bases clear Hepatobiliary: Contracted gallbladder.  Liver normal appearance Pancreas: Normal Spleen: Normal Adrenals/Urinary Tract: Adrenal glands  normal. Cyst at upper pole RIGHT kidney 2.5 x 1.9 cm image 18 previously 2.1 cm greatest size. Additional 10 mm cyst at upper pole LEFT kidney not clearly identified on previous exam. Kidneys, ureters, and bladder otherwise normal appearance. Stomach/Bowel: Normal appendix. Stomach distended  by contrast and food debris. Large and small bowel loops unremarkable Vascular/Lymphatic: Scattered pelvic phleboliths. Aorta normal caliber. No adenopathy. Reproductive: By history post hysterectomy but there appears to be retained cervix with nabothian cyst. Potentially an atrophic LEFT ovary is visualized. Other: No free air or free fluid. No hernia or acute inflammatory process. Musculoskeletal: Disc space narrowing L4-L5 with bulging disc flattening thecal sac. Diffusely bulging disc at L5-S1 as well questionably exerting mass effect upon the RIGHT lateral recess. IMPRESSION: Again identified lingular mass adjacent to LEFT heart border 3.5 x 1.3 cm, slightly increased in size and incompletely visualized at lung base. LEFT renal cysts. No acute intra-abdominal or intrapelvic abnormalities. Degenerative disc disease changes at lower lumbar spine as above. Electronically Signed   By: Lavonia Dana M.D.   On: 02/03/2018 12:09    Assessment:  The patient is a 58 y.o. woman with a lingular lesion and a left lower lobe lesion.  She is s/p electromagnetic navigation bronchoscopy (ENB) of the LUL on 12/12/2017.  Pathology was benign.  She had a pneumothorax after her biopsy.  CEA and LDH were normal on 02/01/2018.  PET scan on 11/21/2017 revealed hypermetabolic lingular opacity similar in size to 11/11/2017.  There was an anterior left lower lobe hypermetabolic opacity, increased compared to 11/11/2017. This short-term progression, as well as morphology, may represent progressive infection.  Neoplasia, especially in the lingula, cannot be excluded.    Chest CT angiogram at [facility] on 01/10/2018 revealed no pulmonary embolism.   There was a 3.2 x 1.7 cm soft tissue mass adjacent to the pericardium by the left ventricle concerning for malignancy. There was left lingular atelectasis possibly with some mucoid impaction.  Chest CT without contrast at [facility] on 01/20/2018 revealed a 2.5 x 1.0 more flattened lingular nodule (compared to 2.2 x 1.5 cm) on the CT dated 11/11/2017.  The nodule in the anterior aspect of the LEFT lower lobe had increased in size to 3.2 x 1.7 cm (previously 2.9 x 1.9 cm) on PET-CT 11/22/2017.  There were no pathologically enlarged mediastinal or hilar lymph nodes.  Abdomen and pelvic CT on 02/03/2018 revealed the 3.5 x 1.3 cm lingular mass adjacent to LEFT heart border, slightly increased in size and incompletely visualized at lung base.  There were LEFT renal cysts, no acute intra-abdominal or intrapelvic abnormalities, and degenerative disc disease changes at lower lumbar spine.  Symptomatically, she has recurrent chest pain.  She feels weak, has hot sensations (all over body) associated with sweating at times then cold sensations (distal arms and legs).  She is anxious and concerned about her health.  She is concerned about the lesion near her heart.  Exam is unremarkable.  Plan:   1.  Lung lesions  Etiology unclear.  Unsure if her symptoms include flushing.  She denies diarrhea.  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  Check chromogranin and 24 urine for 5HIAA.  Discuss with patient consideration of outpatient gallium Ga-68 DOTATATE to assess for neuroendocrine tumor (carcinoid).  Discuss with patient referral to [facility] (Dr Aniceto Boss) for evaluation and possible biopsy as location of tumor near left ventricle and last procedure (ENB) resulted in pneumothorax.  Patient agreeable. 2.  Cardiology  Patient previously seen by Dr. Humphrey Rolls.   Echo on 01/16/2018 revealed an EF of 69% and a trivial effusion.  Lesion was outside of the parietal pericardium.    Spoke with Dr. Rockey Situ.   Disc  provided by patient will be given to him for review.  Thank you for allowing me to participate in Zelda Reames 's care.  I will follow her closely with you while hospitalized and after discharge in the outpatient department.  Dr Janese Banks is on call this weekend.   Lequita Asal, MD  02/03/2018, 3:58 PM

## 2018-02-03 NOTE — ED Triage Notes (Signed)
Patient c/o intermittent chest pains since January 4th. Pain started again this morning in left chest with nausea. Has been seeing pulmonologist and cardiologist. Was told she has mitral valve prolapse and  heart mass.

## 2018-02-04 ENCOUNTER — Inpatient Hospital Stay: Payer: Medicare Other

## 2018-02-04 DIAGNOSIS — R911 Solitary pulmonary nodule: Secondary | ICD-10-CM | POA: Diagnosis not present

## 2018-02-04 DIAGNOSIS — C7A8 Other malignant neuroendocrine tumors: Secondary | ICD-10-CM | POA: Diagnosis not present

## 2018-02-04 DIAGNOSIS — I10 Essential (primary) hypertension: Secondary | ICD-10-CM | POA: Diagnosis not present

## 2018-02-04 DIAGNOSIS — E785 Hyperlipidemia, unspecified: Secondary | ICD-10-CM | POA: Diagnosis not present

## 2018-02-04 DIAGNOSIS — Z90711 Acquired absence of uterus with remaining cervical stump: Secondary | ICD-10-CM | POA: Diagnosis not present

## 2018-02-04 DIAGNOSIS — Z91012 Allergy to eggs: Secondary | ICD-10-CM | POA: Diagnosis not present

## 2018-02-04 DIAGNOSIS — K219 Gastro-esophageal reflux disease without esophagitis: Secondary | ICD-10-CM | POA: Diagnosis not present

## 2018-02-04 DIAGNOSIS — J45909 Unspecified asthma, uncomplicated: Secondary | ICD-10-CM | POA: Diagnosis not present

## 2018-02-04 DIAGNOSIS — Z885 Allergy status to narcotic agent status: Secondary | ICD-10-CM | POA: Diagnosis not present

## 2018-02-04 DIAGNOSIS — Z79899 Other long term (current) drug therapy: Secondary | ICD-10-CM | POA: Diagnosis not present

## 2018-02-04 DIAGNOSIS — Z888 Allergy status to other drugs, medicaments and biological substances status: Secondary | ICD-10-CM | POA: Diagnosis not present

## 2018-02-04 DIAGNOSIS — E7800 Pure hypercholesterolemia, unspecified: Secondary | ICD-10-CM | POA: Diagnosis not present

## 2018-02-04 DIAGNOSIS — R0789 Other chest pain: Secondary | ICD-10-CM | POA: Diagnosis present

## 2018-02-04 DIAGNOSIS — Z91013 Allergy to seafood: Secondary | ICD-10-CM | POA: Diagnosis not present

## 2018-02-04 DIAGNOSIS — Z91018 Allergy to other foods: Secondary | ICD-10-CM | POA: Diagnosis not present

## 2018-02-04 DIAGNOSIS — Z87891 Personal history of nicotine dependence: Secondary | ICD-10-CM | POA: Diagnosis not present

## 2018-02-04 LAB — CBC
HCT: 35.3 % — ABNORMAL LOW (ref 36.0–46.0)
HEMOGLOBIN: 11.5 g/dL — AB (ref 12.0–15.0)
MCH: 29.9 pg (ref 26.0–34.0)
MCHC: 32.6 g/dL (ref 30.0–36.0)
MCV: 91.7 fL (ref 80.0–100.0)
Platelets: 316 10*3/uL (ref 150–400)
RBC: 3.85 MIL/uL — ABNORMAL LOW (ref 3.87–5.11)
RDW: 12.4 % (ref 11.5–15.5)
WBC: 8 10*3/uL (ref 4.0–10.5)
nRBC: 0 % (ref 0.0–0.2)

## 2018-02-04 LAB — BASIC METABOLIC PANEL
Anion gap: 4 — ABNORMAL LOW (ref 5–15)
BUN: 10 mg/dL (ref 6–20)
CO2: 29 mmol/L (ref 22–32)
Calcium: 8.9 mg/dL (ref 8.9–10.3)
Chloride: 106 mmol/L (ref 98–111)
Creatinine, Ser: 0.64 mg/dL (ref 0.44–1.00)
GFR calc Af Amer: >60 mL/min (ref >60)
GFR calc non Af Amer: >60 mL/min (ref >60)
Glucose, Bld: 87 mg/dL (ref 70–99)
Potassium: 4.4 mmol/L (ref 3.5–5.1)
SODIUM: 139 mmol/L (ref 135–145)

## 2018-02-04 LAB — TROPONIN I: Troponin I: <0.03 ng/mL (ref <0.03)

## 2018-02-04 MED ORDER — PANTOPRAZOLE SODIUM 40 MG PO TBEC: 40.0000 mg | DELAYED_RELEASE_TABLET | Freq: Every day | ORAL | 0 refills | Status: DC

## 2018-02-04 NOTE — Progress Notes (Signed)
Patient d/c'd to home.  Tele and IV removed prior to discharge.  Patient verbalized understanding of discharge instructions.

## 2018-02-04 NOTE — Discharge Summary (Signed)
[facility] [place] NAME: Carmen Grimes    MR#:  [id number]  DATE OF BIRTH:  [birth year]  DATE OF ADMISSION:  02/03/2018 ADMITTING PHYSICIAN: Gladstone Lighter, MD  DATE OF DISCHARGE: No discharge date for patient encounter.  PRIMARY CARE PHYSICIAN: Perrin Maltese, MD    ADMISSION DIAGNOSIS:  Flushing [R23.2] Epigastric abdominal pain [R10.13] Elevated blood pressure reading [R03.0] Chest pain, unspecified type [R07.9]  DISCHARGE DIAGNOSIS:  Active Problems:   Chest pain   SECONDARY DIAGNOSIS:   Past Medical History:  Diagnosis Date  . Asthma    controlled  . Dyspnea 12/2017   difficulty getting her breath at all times  . GERD (gastroesophageal reflux disease)   . H/O: GI bleed   . Hypertension   . Mitral valve prolapse   . Osteoarthritis    knees    HOSPITAL COURSE:  Carmen Grimes  is a 58 y.o. female with a known history of GERD, hypertension, mitral valve prolapse brought in from pulmonary office secondary to ongoing chest pain for almost 4 weeks now.  *Acute atypical-noncardiac chest pain  Refer to regular nursing for bed on our ACS protocol, cardiac enzymes were negative, cardiology did see patient while in house-no intervention recommended given recent stress test which was normal, CT abdomen noted for increasing lingular mass-plan of care per below   *Acute lingular lung mass given her symptoms of palpitations and flushing, concern for neuroendocrine tumor by pulmonary Oncology did see patient while in house-recommended outpatient follow-up at [facility] with Dr. Durenda Hurt for biopsy for further evaluation 24-hour urine metanephrines, catecholamines, serum epinephrines, chromogranin, and 5-HIAA in the urine ordered  *Postmenopausal, chronic  Continue estradiol   *Chronic hypertension Stable on current regiment  DISCHARGE CONDITIONS:   stable  CONSULTS OBTAINED:  Treatment Team:  Sindy Guadeloupe,  MD  DRUG ALLERGIES:   Allergies  Allergen Reactions  . Shellfish Allergy Anaphylaxis    Other reaction(s): anaphylaxis/angioedema Other reaction(s): anaphylaxis/angioedema  Betadine on skin is okay  . Tramadol Other (See Comments)    "blacked out", found wandering in the street  . Ascorbic Acid Hives and Nausea And Vomiting    HIVES / VOMITING   . Celecoxib Other (See Comments)    GI bleed Other reaction(s): gi distress, Other (comments), Other (See Comments) GI BLEED       . Gabapentin Other (See Comments)    Drowsiness. Like a zombie, not functional   . Hydrocodone Other (See Comments)    Hair falls out.  Marland Kitchen Hydrocodone-Acetaminophen     Other reaction(s): other/intolerance HAIR FALLS OUT   . Ibuprofen Other (See Comments)    "Blood disorder", GI bleed. Other reaction(s): gi distress, Other (comments), Other (See Comments)   . Naproxen Other (See Comments)    GI bleed Other reaction(s): Other (comments), Other (See Comments)        . Prednisone Other (See Comments)    Tremors, severe  . Singulair [Montelukast] Other (See Comments)    Syncope..passed out  . Strawberry Extract Hives, Nausea And Vomiting and Nausea Only    Severe vomitting  . Codeine Itching and Nausea Only    Other reaction(s): mild rash/itching  . Doxycycline Other (See Comments)    Unknown reaction. Other reaction(s): Other (See Comments), Unknown, Unknown (comments) Pt does not remember   . Eggs Or Egg-Derived Products Rash and Other (See Comments)    rash  . Latex Rash and Itching    Other reaction(s): mild  rash/itching  . Nsaids Nausea And Vomiting and Other (See Comments)    Gastric bleed        . Strawberry Flavor Hives  . Sulfamethoxazole-Trimethoprim Other (See Comments)    Other reaction(s): Other (See Comments), other/intolerance MAKES PAIN WORSE  Increases pain Increases pain     DISCHARGE MEDICATIONS:   Allergies as of 02/04/2018      Reactions    Shellfish Allergy Anaphylaxis   Other reaction(s): anaphylaxis/angioedema Other reaction(s): anaphylaxis/angioedema Betadine on skin is okay   Tramadol Other (See Comments)   "blacked out", found wandering in the street   Ascorbic Acid Hives, Nausea And Vomiting   HIVES / VOMITING   Celecoxib Other (See Comments)   GI bleed Other reaction(s): gi distress, Other (comments), Other (See Comments) GI BLEED   Gabapentin Other (See Comments)   Drowsiness. Like a zombie, not functional   Hydrocodone Other (See Comments)   Hair falls out.   Hydrocodone-acetaminophen    Other reaction(s): other/intolerance HAIR FALLS OUT   Ibuprofen Other (See Comments)   "Blood disorder", GI bleed. Other reaction(s): gi distress, Other (comments), Other (See Comments)   Naproxen Other (See Comments)   GI bleed Other reaction(s): Other (comments), Other (See Comments)   Prednisone Other (See Comments)   Tremors, severe   Singulair [montelukast] Other (See Comments)   Syncope..passed out   Strawberry Extract Hives, Nausea And Vomiting, Nausea Only   Severe vomitting   Codeine Itching, Nausea Only   Other reaction(s): mild rash/itching   Doxycycline Other (See Comments)   Unknown reaction. Other reaction(s): Other (See Comments), Unknown, Unknown (comments) Pt does not remember   Eggs Or Egg-derived Products Rash, Other (See Comments)   rash   Latex Rash, Itching   Other reaction(s): mild rash/itching   Nsaids Nausea And Vomiting, Other (See Comments)   Gastric bleed   Strawberry Flavor Hives   Sulfamethoxazole-trimethoprim Other (See Comments)   Other reaction(s): Other (See Comments), other/intolerance MAKES PAIN WORSE Increases pain Increases pain      Medication List    TAKE these medications   Biotin 10000 MCG Tabs Take 1 tablet by mouth daily.   carvedilol 6.25 MG tablet Commonly known as:  COREG Take 6.25 mg by mouth 2 (two) times daily.   estradiol 2 MG tablet Commonly  known as:  ESTRACE Take 1 tablet (2 mg total) by mouth daily.   GLUCOSAMINE CHOND COMPLEX/MSM PO Take 1 capsule by mouth daily.   metoprolol tartrate 50 MG tablet Commonly known as:  LOPRESSOR Take by mouth.   ondansetron 8 MG disintegrating tablet Commonly known as:  ZOFRAN-ODT TAKE 1 TABLET BY MOUTH EVERY 4 TO 6 HOURS AS NEEDED   pantoprazole 40 MG tablet Commonly known as:  PROTONIX Take 1 tablet (40 mg total) by mouth daily.   VENTOLIN HFA 108 (90 Base) MCG/ACT inhaler Generic drug:  albuterol Inhale 2 puffs into the lungs every 6 (six) hours as needed.   Vitamin D3 1.25 MG (50000 UT) Caps TAKE ONE CAPSULE BY MOUTH ONE TIME PER WEEK        DISCHARGE INSTRUCTIONS:    If you experience worsening of your admission symptoms, develop shortness of breath, life threatening emergency, suicidal or homicidal thoughts you must seek medical attention immediately by calling 911 or calling your MD immediately  if symptoms less severe.  You Must read complete instructions/literature along with all the possible adverse reactions/side effects for all the Medicines you take and that have been prescribed to you.  Take any new Medicines after you have completely understood and accept all the possible adverse reactions/side effects.   Please note  You were cared for by a hospitalist during your hospital stay. If you have any questions about your discharge medications or the care you received while you were in the hospital after you are discharged, you can call the unit and asked to speak with the hospitalist on call if the hospitalist that took care of you is not available. Once you are discharged, your primary care physician will handle any further medical issues. Please note that NO REFILLS for any discharge medications will be authorized once you are discharged, as it is imperative that you return to your primary care physician (or establish a relationship with a primary care physician if you  do not have one) for your aftercare needs so that they can reassess your need for medications and monitor your lab values.    Today   CHIEF COMPLAINT:   Chief Complaint  Patient presents with  . Chest Pain    HISTORY OF PRESENT ILLNESS:   58 y.o. female with a known history of GERD, hypertension, mitral valve prolapse brought in from pulmonary office secondary to ongoing chest pain for almost 4 weeks now.  Patient first noted to have a left lower lobe nodular opacification either mucous plugging or mass in November 2019.  Patient had a baseline biopsy done which turned out to be nonmalignant, however it was complicated by pneumothorax that resolved with chest tube placement.  The lingular mass has since been decreasing in size.  Patient's current symptoms started on January 07, 2018 when she was driving home she started feeling pain in her chest radiating to her back worsening with deep breathing and movement, also associated with palpitations, nausea and diaphoresis.  Since then she has had multiple ER visits here and also at [facility] with similar complaints.  She states it happens almost every day and fluctuates and sometimes associated with palpitations and flushing and diaphoresis.  She was seeing cardiology in the office as an outpatient and had Holter monitor done, stress test done and CT coronary angiogram done which did not show any coronary blockages.  During this work-up, repeat CT of the chest recently showed a 3 x 2 cm mass in the left lower lobe anterior part of the lung close to the heart. She was sent in from pulmonary office today because of ongoing symptoms.  Labs are within normal limits, troponin is negative.  CT of the abdomen otherwise did not show any new acute findings.  VITAL SIGNS:  Blood pressure 119/79, pulse 66, temperature 98.2 F (36.8 C), temperature source Oral, resp. rate 17, height 5\' 4"  (1.626 m), weight 70.5 kg, SpO2 100 %.  I/O:    Intake/Output  Summary (Last 24 hours) at 02/04/2018 1016 Last data filed at 02/04/2018 1008 Gross per 24 hour  Intake 1129.34 ml  Output 900 ml  Net 229.34 ml    PHYSICAL EXAMINATION:  GENERAL:  58 y.o.-year-old patient lying in the bed with no acute distress.  EYES: Pupils equal, round, reactive to light and accommodation. No scleral icterus. Extraocular muscles intact.  HEENT: Head atraumatic, normocephalic. Oropharynx and nasopharynx clear.  NECK:  Supple, no jugular venous distention. No thyroid enlargement, no tenderness.  LUNGS: Normal breath sounds bilaterally, no wheezing, rales,rhonchi or crepitation. No use of accessory muscles of respiration.  CARDIOVASCULAR: S1, S2 normal. No murmurs, rubs, or gallops.  ABDOMEN: Soft, non-tender, non-distended. Bowel sounds  present. No organomegaly or mass.  EXTREMITIES: No pedal edema, cyanosis, or clubbing.  NEUROLOGIC: Cranial nerves II through XII are intact. Muscle strength 5/5 in all extremities. Sensation intact. Gait not checked.  PSYCHIATRIC: The patient is alert and oriented x 3.  SKIN: No obvious rash, lesion, or ulcer.   DATA REVIEW:   CBC Recent Labs  Lab 02/04/18 0136  WBC 8.0  HGB 11.5*  HCT 35.3*  PLT 316    Chemistries  Recent Labs  Lab 02/04/18 0136  NA 139  K 4.4  CL 106  CO2 29  GLUCOSE 87  BUN 10  CREATININE 0.64  CALCIUM 8.9    Cardiac Enzymes Recent Labs  Lab 02/04/18 0136  TROPONINI <0.03    Microbiology Results  No results found for this or any previous visit.  RADIOLOGY:  Dg Chest 2 View  Result Date: 02/03/2018 CLINICAL DATA:  Chest pain. EXAM: CHEST - 2 VIEW COMPARISON:  01/27/2018 and 01/08/2018 FINDINGS: The heart size and pulmonary vascularity are normal. No pneumothorax. Nodular density persists in the lingula, unchanged. Tiny new area of atelectasis at the left base laterally. Right lung is clear. IMPRESSION: No acute abnormalities.  Stable pulmonary nodule in the lingula. Electronically Signed    By: Lorriane Shire M.D.   On: 02/03/2018 10:42   US Abdomen Complete  Result Date: 02/04/2018 CLINICAL DATA:  Epigastric pain. EXAM: ABDOMEN ULTRASOUND COMPLETE COMPARISON:  CT abdomen pelvis from yesterday. FINDINGS: Gallbladder: No gallstones or wall thickening visualized. No sonographic Murphy sign noted by sonographer. Common bile duct: Diameter: 5 mm, normal. Liver: No focal lesion identified. Within normal limits in parenchymal echogenicity. Portal vein is patent on color Doppler imaging with normal direction of blood flow towards the liver. IVC: No abnormality visualized. Pancreas: Not well visualized due to overlying bowel gas. Spleen: Size and appearance within normal limits. Right Kidney: Length: 9.9 cm. Echogenicity within normal limits. No mass or hydronephrosis visualized. Left Kidney: Length: 10.8 cm. Echogenicity within normal limits. No mass or hydronephrosis visualized. 2.7 cm simple cyst in the upper pole. Abdominal aorta: No aneurysm visualized. Other findings: None. IMPRESSION: 1. Left renal cyst.  Otherwise normal abdominal ultrasound. Electronically Signed   By: Titus Dubin M.D.   On: 02/04/2018 09:48   Ct Abdomen Pelvis W Contrast  Result Date: 02/03/2018 CLINICAL DATA:  Chest and upper abdominal pain with nausea for 1 month EXAM: CT ABDOMEN AND PELVIS WITH CONTRAST TECHNIQUE: Multidetector CT imaging of the abdomen and pelvis was performed using the standard protocol following bolus administration of intravenous contrast. Sagittal and coronal MPR images reconstructed from axial data set. CONTRAST:  135mL ISOVUE-300 IOPAMIDOL (ISOVUE-300) INJECTION 61% IV. COMPARISON:  PET-CT 11/22/2017 FINDINGS: Lower chest: Partial visualization of a soft tissue mass adjacent to the LEFT heart border, incompletely assessed on image 1, visualized portion 3.5 x 1.3 cm increased since prior study; this was hypermetabolic on prior PET-CT. Remaining lung bases clear Hepatobiliary: Contracted  gallbladder.  Liver normal appearance Pancreas: Normal Spleen: Normal Adrenals/Urinary Tract: Adrenal glands normal. Cyst at upper pole RIGHT kidney 2.5 x 1.9 cm image 18 previously 2.1 cm greatest size. Additional 10 mm cyst at upper pole LEFT kidney not clearly identified on previous exam. Kidneys, ureters, and bladder otherwise normal appearance. Stomach/Bowel: Normal appendix. Stomach distended by contrast and food debris. Large and small bowel loops unremarkable Vascular/Lymphatic: Scattered pelvic phleboliths. Aorta normal caliber. No adenopathy. Reproductive: By history post hysterectomy but there appears to be retained cervix with nabothian cyst. Potentially  an atrophic LEFT ovary is visualized. Other: No free air or free fluid. No hernia or acute inflammatory process. Musculoskeletal: Disc space narrowing L4-L5 with bulging disc flattening thecal sac. Diffusely bulging disc at L5-S1 as well questionably exerting mass effect upon the RIGHT lateral recess. IMPRESSION: Again identified lingular mass adjacent to LEFT heart border 3.5 x 1.3 cm, slightly increased in size and incompletely visualized at lung base. LEFT renal cysts. No acute intra-abdominal or intrapelvic abnormalities. Degenerative disc disease changes at lower lumbar spine as above. Electronically Signed   By: Lavonia Dana M.D.   On: 02/03/2018 12:09    EKG:   Orders placed or performed during the hospital encounter of 02/03/18  . ED EKG  . ED EKG      Management plans discussed with the patient, family and they are in agreement.  CODE STATUS:     Code Status Orders  (From admission, onward)         Start     Ordered   02/03/18 1357  Full code  Continuous     02/03/18 1357        Code Status History    Date Active Date Inactive Code Status Order ID Comments User Context   12/12/2017 1946 12/14/2017 1534 Full Code [id number]  SalaryAvel Peace, MD Inpatient    Advance Directive Documentation     Most Recent Value   Type of Advance Directive  Healthcare Power of Attorney, Living will  Pre-existing out of facility DNR order (yellow form or pink MOST form)  -  "MOST" Form in Place?  -      TOTAL TIME TAKING CARE OF THIS PATIENT: 40 minutes.    Avel Peace Skyrah Krupp M.D on 02/04/2018 at 10:16 AM  Between 7am to 6pm - Pager - [phone]  After 6pm go to [email or web address] - password [email or web address]  Office  [phone]  CC: Primary care physician; Perrin Maltese, MD   Note: This dictation was prepared with Dragon dictation along with smaller phrase technology. Any transcriptional errors that result from this process are unintentional.

## 2018-02-06 ENCOUNTER — Other Ambulatory Visit: Payer: Self-pay | Admitting: Urgent Care

## 2018-02-06 ENCOUNTER — Telehealth: Payer: Self-pay | Admitting: *Deleted

## 2018-02-06 DIAGNOSIS — J9859 Other diseases of mediastinum, not elsewhere classified: Secondary | ICD-10-CM

## 2018-02-06 DIAGNOSIS — R002 Palpitations: Secondary | ICD-10-CM

## 2018-02-06 DIAGNOSIS — R911 Solitary pulmonary nodule: Secondary | ICD-10-CM

## 2018-02-06 NOTE — Telephone Encounter (Signed)
Patient aware of having monitor placed. She is able to come in tomorrow at 1 pm to have placed. Her appointment is 02/28/18 with Thurmond Butts. This should give [place] enough time for her to wear and get the results by her appointment date and time.

## 2018-02-06 NOTE — Telephone Encounter (Signed)
Carmen Merritts, MD  P Cv Div [facility] Triage        Patient was in the hospital for tachycardia,  Can we call on Monday and see if she would like a zio monitor to rule out arrhythmia  Thx  TG

## 2018-02-06 NOTE — Telephone Encounter (Signed)
-----   Message from Clarisse Gouge sent at 02/06/2018  9:17 AM EST ----- Regarding: [facility] fu ? zio needed Per note :  Tachycardia Several admissions to the emergency room, no arrhythmia has been documented apart from sinus tachycardia Unable to exclude missing other arrhythmia such as SVT or atrial tachycardia -If she would like we can arrange a 2-week event monitor/Zio patch to rule out arrhythmia as a cause of her palpitations  Hypertension On beta-blocker for tachycardia  [facility] will sign off.   Medication Recommendations:  no changes Other recommendations (labs, testing, etc):  outpt zio monitor, we will call to arrange on Monday   Scheduled fu appt 2/25 with Thurmond Butts notified patient we will call to arrange to mail or apply monitor

## 2018-02-07 ENCOUNTER — Telehealth: Payer: Self-pay

## 2018-02-07 ENCOUNTER — Ambulatory Visit (INDEPENDENT_AMBULATORY_CARE_PROVIDER_SITE_OTHER): Payer: Medicare Other

## 2018-02-07 DIAGNOSIS — R002 Palpitations: Secondary | ICD-10-CM

## 2018-02-07 LAB — METANEPHRINES, PLASMA
Metanephrine, Free: <10 pg/mL (ref 0–62)
Normetanephrine, Free: 15 pg/mL (ref 0–145)

## 2018-02-07 LAB — CHROMOGRANIN A: Chromogranin A (ng/mL): 85.5 ng/mL (ref 0.0–101.8)

## 2018-02-07 NOTE — Telephone Encounter (Signed)
On the patient voice mail to infrom her , Per Dr Mike Gip the patient didn't have 24 hur urine for 5HIAA durning hosptial stay and would like to obtain one. I have asked for the patient to return my call in the [place] office today.

## 2018-02-07 NOTE — Telephone Encounter (Signed)
-----   Message from Lequita Asal, MD sent at 02/06/2018  1:08 PM EST ----- Regarding: RE: She will need follow-up after discharge  If she did not collect a 24 hour urine for 5HIAA during her hospitalization, we should collect it with [place].  M ----- Message ----- From: Karen Kitchens, NP Sent: 02/06/2018   8:39 AM EST To: Lequita Asal, MD Subject: RE: She will need follow-up after discharge    It does not appear as if a 24 hr urine was collected. They "sent out" the chromogranin A, plasma 5-HIAA, and metanephrines levels to [facility].  ----- Message ----- From: Lequita Asal, MD Sent: 02/06/2018   6:37 AM EST To: Karen Kitchens, NP Subject: She will need follow-up after discharge         I will see if Dr. Aniceto Boss will see.  Can you check with the lab and see if she has a 24 hour urine for 5HIAA pending?  M

## 2018-02-08 LAB — MISC LABCORP TEST (SEND OUT)
LabCorp test name: 5
Labcorp test code: 504510

## 2018-02-10 ENCOUNTER — Other Ambulatory Visit: Payer: Self-pay

## 2018-02-10 DIAGNOSIS — J9859 Other diseases of mediastinum, not elsewhere classified: Secondary | ICD-10-CM

## 2018-02-15 LAB — 5 HIAA, QUANTITATIVE, URINE, 24 HOUR
5-HIAA, Ur: 3.4 mg/L
5-HIAA,Quant.,24 Hr Urine: 5.4 mg/24 hr (ref 0.0–14.9)
Total Volume: 1600

## 2018-02-16 ENCOUNTER — Ambulatory Visit: Payer: Medicare Other | Admitting: Hematology and Oncology

## 2018-02-20 ENCOUNTER — Ambulatory Visit: Payer: Medicare Other | Admitting: Hematology and Oncology

## 2018-02-23 ENCOUNTER — Inpatient Hospital Stay: Payer: Medicare Other | Admitting: Hematology and Oncology

## 2018-02-23 MED ORDER — LIDOCAINE HCL (PF) 1 % IJ SOLN
0.50 | INTRAMUSCULAR | Status: DC
Start: ? — End: 2018-02-23

## 2018-02-23 NOTE — Progress Notes (Deleted)
[facility] day:  02/23/2018  Chief Complaint: Carmen Grimes is a 58 y.o. female with a lingular and left lower lobe lesion who is seen for reassessment.  HPI:   The patient was last seen in the medical oncology clinic on 02/01/2018 for initial consultation.  She had a lingular and left lower lobe lesion.  She was presented at tumor board.  Recommendations were for close monitoring.  She was referred to [facility].  She was admitted to [facility] from 02/03/2018 - 02/04/2018 with recurrent chest pain.  Cardiac evaluation was negative.  Work-up for a possible carcinoid tumor was pursued. Chromogranin A was 85.5 (0-101.8).  24 hour urine for 5HIAA was 5.4 mg/24 hrs.  Normetanephrine 15 (0-145) and metanephrine < 10 (0-62).  TSH was 1.543.  She was seen by Dr,. Stinchcombe on 02/06/2018.  Normetanephrine was 43 (0-145) and metanephrine was < 10.  Paraneoplastic panel on 01/31/2018 was negative.  During the interim,   Past Medical History:  Diagnosis Date  . Asthma    controlled  . Dyspnea 12/2017   difficulty getting her breath at all times  . GERD (gastroesophageal reflux disease)   . H/O: GI bleed   . Hypertension   . Mitral valve prolapse   . Osteoarthritis    knees    Past Surgical History:  Procedure Laterality Date  . ABDOMINAL HYSTERECTOMY  2002  . ANTERIOR FUSION CERVICAL SPINE  2009  . COLONOSCOPY  2013   normal  . ELECTROMAGNETIC NAVIGATION BROCHOSCOPY Left 12/12/2017   Procedure: ELECTROMAGNETIC NAVIGATION BRONCHOSCOPY;  Surgeon: Flora Lipps, MD;  Location: [facility] ORS;  Service: Cardiopulmonary;  Laterality: Left;  . ESOPHAGOGASTRODUODENOSCOPY  1990   GI bleed  . FLEXIBLE BRONCHOSCOPY Left 12/12/2017   Procedure: FLEXIBLE BRONCHOSCOPY;  Surgeon: Flora Lipps, MD;  Location: [facility] ORS;  Service: Cardiopulmonary;  Laterality: Left;  . KNEE ARTHROSCOPY Right 2010   x3  . KNEE SURGERY Left    Arthroscopy x1  . LAPAROSCOPIC SUPRACERVICAL  HYSTERECTOMY  2002   with BSO for fibroids/ AUB/ pelvic pain  . LAPAROSCOPIC TUBAL LIGATION  1982  . SHOULDER SURGERY Right    rotator cuff repair  . SHOULDER SURGERY Right 2011   second rotator cuff repair  . tubal reanastamosis  1985    Family History  Problem Relation Age of Onset  . Diabetes Mother   . Dementia Mother        Alzheimers  . Hypertension Mother   . Glaucoma Mother   . Prostate cancer Father 42  . Stroke Father   . Arthritis Father   . Osteoporosis Father   . Prostate cancer Brother   . Breast cancer Neg Hx     Social History:  reports that she has quit smoking. She has a 13.00 pack-year smoking history. She has never used smokeless tobacco. She reports previous alcohol use. She reports that she does not use drugs.  Former 1 ppd smoker for 13 years. Former ETOH use when she was younger; none now. Lives in [place] since 2018; moved from [place].  Patient is a Building control surveyor.  She is planning on going to MRI school (she was to start in 01/2018). [employer]; adheres to strict vegan diet.  She has a dog and a cat.  The patient is alone today.  Allergies:  Allergies  Allergen Reactions  . Shellfish Allergy Anaphylaxis    Other reaction(s): anaphylaxis/angioedema Other reaction(s): anaphylaxis/angioedema  Betadine on skin  is okay  . Tramadol Other (See Comments)    "blacked out", found wandering in the street  . Ascorbic Acid Hives and Nausea And Vomiting    HIVES / VOMITING   . Celecoxib Other (See Comments)    GI bleed Other reaction(s): gi distress, Other (comments), Other (See Comments) GI BLEED       . Gabapentin Other (See Comments)    Drowsiness. Like a zombie, not functional   . Hydrocodone Other (See Comments)    Hair falls out.  Marland Kitchen Hydrocodone-Acetaminophen     Other reaction(s): other/intolerance HAIR FALLS OUT   . Ibuprofen Other (See Comments)    "Blood disorder", GI bleed. Other reaction(s):  gi distress, Other (comments), Other (See Comments)   . Naproxen Other (See Comments)    GI bleed Other reaction(s): Other (comments), Other (See Comments)        . Prednisone Other (See Comments)    Tremors, severe  . Singulair [Montelukast] Other (See Comments)    Syncope..passed out  . Strawberry Extract Hives, Nausea And Vomiting and Nausea Only    Severe vomitting  . Codeine Itching and Nausea Only    Other reaction(s): mild rash/itching  . Doxycycline Other (See Comments)    Unknown reaction. Other reaction(s): Other (See Comments), Unknown, Unknown (comments) Pt does not remember   . Eggs Or Egg-Derived Products Rash and Other (See Comments)    rash  . Latex Rash and Itching    Other reaction(s): mild rash/itching  . Nsaids Nausea And Vomiting and Other (See Comments)    Gastric bleed        . Strawberry Flavor Hives  . Sulfamethoxazole-Trimethoprim Other (See Comments)    Other reaction(s): Other (See Comments), other/intolerance MAKES PAIN WORSE  Increases pain Increases pain     Current Medications: Current Outpatient Medications  Medication Sig Dispense Refill  . Biotin 10000 MCG TABS Take 1 tablet by mouth daily.    . carvedilol (COREG) 6.25 MG tablet Take 6.25 mg by mouth 2 (two) times daily.    . Cholecalciferol (VITAMIN D3) 50000 units CAPS TAKE ONE CAPSULE BY MOUTH ONE TIME PER WEEK  1  . estradiol (ESTRACE) 2 MG tablet Take 1 tablet (2 mg total) by mouth daily. 90 tablet 3  . metoprolol tartrate (LOPRESSOR) 50 MG tablet Take by mouth.    . Misc Natural Products (GLUCOSAMINE CHOND COMPLEX/MSM PO) Take 1 capsule by mouth daily.     . ondansetron (ZOFRAN-ODT) 8 MG disintegrating tablet TAKE 1 TABLET BY MOUTH EVERY 4 TO 6 HOURS AS NEEDED  0  . pantoprazole (PROTONIX) 40 MG tablet Take 1 tablet (40 mg total) by mouth daily. 30 tablet 0  . VENTOLIN HFA 108 (90 Base) MCG/ACT inhaler Inhale 2 puffs into the lungs every 6 (six) hours as needed.   6    No current facility-administered medications for this visit.     Review of Systems:  GENERAL:  Feels"not like herself".  Feels "hot and cold".  Intermittent sweats.  No weight loss. PERFORMANCE STATUS (ECOG):  1 HEENT:  Saw ophthalmologist this morning.  No visual changes, runny nose, sore throat, mouth sores or tenderness. Lungs: No shortness of breath or cough.  No hemoptysis. Cardiac:  Intermittent chest pain/pressure (see HPI).  No palpitations, orthopnea, or PND. GI:  Poor appetite.  Constipation.  Daily nausea.  No vomiting, diarrhea, constipation, melena or hematochezia. GU:  No urgency, frequency, dysuria, or hematuria. Musculoskeletal:  Arthritis in knee.  Chronic arthralgias secondary  to osteoarthritis.  No muscle tenderness. Extremities:  No pain or swelling. Skin:  No rashes or skin changes.  No after bath itching. Neuro:   Headache.  Feels weak.  Dizzy at times (? Secondary to metoprolol.  No focal numbness or weakness, balance or coordination issues. Endocrine:  No diabetes, thyroid issues, hot flashes or night sweats. Psych:  No mood changes, depression or anxiety. Pain:  No focal pain. Review of systems:  All other systems reviewed and found to be negative.  Physical Exam: There were no vitals taken for this visit. GENERAL:  Well developed, well nourished, woman sitting comfortably in the exam room in no acute distress. MENTAL STATUS:  Alert and oriented to person, place and time. HEAD: Curly dark hair with graying.  Normocephalic, atraumatic, face symmetric, no Cushingoid features. EYES:  Brown eyes.  Slight left amblyopia.  Pupils dilated s/p eye appointment.  Pupils equal round.  No conjunctivitis or scleral icterus. ENT:  Oropharynx clear without lesion.  Tongue normal. Mucous membranes moist.  RESPIRATORY:  Clear to auscultation without rales, wheezes or rhonchi. CARDIOVASCULAR:  Regular rate and rhythm without murmur, rub or gallop. ABDOMEN:  Soft, non-tender,  with active bowel sounds, and no hepatosplenomegaly.  No masses. SKIN:  No rashes, ulcers or lesions. EXTREMITIES: No edema, no skin discoloration or tenderness.  No palpable cords. LYMPH NODES: No palpable cervical, supraclavicular, axillary or inguinal adenopathy  NEUROLOGICAL: Unremarkable. PSYCH:  Appropriate.   No visits with results within 3 Day(s) from this visit.  Latest known visit with results is:  Orders Only on 02/10/2018  Component Date Value Ref Range Status  . 5-HIAA, Ur 02/10/2018 3.4  Undefined mg/L Final   Comment: (NOTE) This test was developed and its performance characteristics determined by [facility]. It has not been cleared or approved by the Food and Drug Administration.   Marland Kitchen 5-HIAA,Quant.,24 Hr Urine 02/10/2018 5.4  0.0 - 14.9 mg/24 hr Final   Comment: (NOTE) Performed At: [facility] [place], [place] [id number] Rush Farmer MD [id number]   . Total Volume 02/10/2018 1,600   Final   Performed at [facility], [address] [place], [place] [address]    Assessment:  Carmen Grimes is a 58 y.o. female with a lingular lesion and a left lower lobe lesion.  She is s/p electromagnetic navigation bronchoscopy (ENB) of the LUL on 12/12/2017.  Pathology was benign.  She had a pneumothorax after her biopsy.  CEA and LDH were normal on 02/01/2018.  Chest CT on 11/11/2017 revealed a 1.5 x 2.2 cm nodule within the lingula which was thought to represent primary pulmonary malignancy or potentially focal infectious process.  PET scan on 11/21/2017 revealed hypermetabolic lingular opacity similar in size to 11/11/2017.  There was an anterior left lower lobe hypermetabolic opacity, increased compared to 11/11/2017. This short-term progression, as well as morphology, may represent progressive infection.  Neoplasia, especially in the lingula, cannot be excluded.    Chest CT angiogram at [facility] on 01/10/2018 revealed no pulmonary  embolism.  There was a 3.2 x 1.7 cm soft tissue mass adjacent to the pericardium by the left ventricle concerning for malignancy. There was left lingular atelectasis possibly with some mucoid impaction.  Chest CT without contrast at [facility] on 01/20/2018 revealed a 2.5 x 1.0 more flattened lingular nodule (compared to 2.2 x 1.5 cm) on the CT dated 11/11/2017.  The nodule in the anterior aspect of the LEFT lower lobe had increased in size to  3.2 x 1.7 cm (previously 2.9 x 1.9 cm) on PET-CT 11/22/2017.  There were no pathologically enlarged mediastinal or hilar lymph nodes.  Symptomatically, she feels weak, has intermittent chest pain, diaphoresis, and feels hot and cold.  Exam is unremarkable.  Plan: 1.  Labs today:  LDH, uric acid, CEA. 2.  Lingular and left lower lobe lesion  Review imaging with patient.  Suspect left lower lobe lesion originates in lung rather than pericardium.  If lung cancer T4 lesion.  Discuss obtaining tissue for diagnosis and treatment.  Present at tumor board on 02/02/2018.  Follow-up with Dr Mortimer Fries on 02/03/2018 as planned.    Contact Dr. Mortimer Fries and Dr Genevive Bi- done.  Anticipate biopsy (? CT guided). 3.  Contact patient after tumor board regarding tumor board discussions and plans for biopsy.   Lequita Asal, MD  02/23/2018, 3:32 AM   I saw and evaluated the patient, participating in the key portions of the service and reviewing pertinent diagnostic studies and records.  I reviewed the nurse practitioner's note and agree with the findings and the plan.  The assessment and plan were discussed with the patient.  Multiple questions were asked by the patient and answered.   Nolon Stalls, MD 02/23/2018,3:32 AM

## 2018-02-27 NOTE — Progress Notes (Signed)
Cardiology Office Note Date:  02/28/2018  Patient ID:  Carmen Grimes, Alferd Apa 16-Sep-1960, MRN [id number] PCP:  Perrin Maltese, MD  Cardiologist:  Dr. Rockey Situ, MD    Chief Complaint: Hospital follow-up  History of Present Illness: Carmen Grimes is a 58 y.o. female with history of recently diagnosed lingular mass and 11/2017 followed by pulmonology and oncology with plan to follow-up at [facility] for biopsy, tobacco abuse for 18 years, prior GI bleed, mitral valve prolapse, COPD with asthma, pulmonary nodule, hypertension, hyperlipidemia, left shoulder impingement, osteoarthritis, and anxiety who presents for hospital follow-up after recent admission from 1/31 through 2/1 for chest pain.  Prior remote stress test in 2017 for atypical chest pain was negative with recommendation for treatment of reflux or gallbladder disease.  In 10/2017 she developed shortness of breath, productive cough, chills, sweats, and fatigue with 3 courses of antibiotics not improving symptoms.  Follow-up chest x-ray revealed a mass in the lingula worrisome for malignancy.  Follow-up PET scan showed hypermetabolic lingular opacity.  Since then, she has been seen by both pulmonology and oncology with navigational bronchoscopy being performed in early 12/2017 with pathology indicating a benign sample.  This was followed by multiple visits to the [facility] ED for chest pain described as intermittent pressure, at times sharp with each episode lasting for several seconds.  In early 01/2018 CTA of the chest at [facility] was negative for PE with a 3.2 x 1.7 cm soft tissue mass adjacent to the pericardium by the LV concerning for malignancy.  In late 01/2018 she presented to the [facility] ED for recurrent intermittent chest pain.  She was seen by Dr. Humphrey Rolls and recommended outpatient echo, stress test, and cardiac CT through his office.  Discharge summary indicates the stress test was normal.  Her chest pain was felt to be atypical and in the setting of thoracic  mass.  It was recommended she follow-up with [facility] pulmonology/oncology.  Outpatient cardiac monitoring was recommended for documented tachycardic readings over multiple ED visits in an effort to exclude arrhythmia in the setting of her thoracic mass, which is pending at this time.  Cardiac enzymes remained negative.  Labs: Potassium 4.4, serum creatinine 0.64, WBC 8.0, Hgb 11.5, PLT 316, LDL 136, TSH normal, plasma metanephrines normal, 5 HIAA normal sed rate 16  Since her hospital discharge, she has again been seen by the [place] ED on 02/20/2018 for recurrent chest pain.  She is self-reported blood pressures in the 200s over 100s at home.  BP in the ED was noted to be 141/94.  Patient symptoms resolved without any intervention in the ED.  Labs were unrevealing including negative cardiac enzymes.  Chest x-ray showed no acute cardiopulmonary process with previously described mass still present.  Patient was discharged to outpatient follow-up.  Review of prior cardiac records from [facility] with Dr. Humphrey Rolls:  01/2018 Holter monitor - patient was evaluated for 2 days, 18 hours, 20 minutes with a predominant rhythm of sinus, average heart rate excluding ectopy was at 68 bpm with a minimum heart rate of 52 bpm at 2:41 AM and a maximum heart rate of 106 bpm.  No ventricular ectopies were noted.  Patient had 101 supraventricular ectopies averaging 1.5/h with 95 single supraventricular ectopies and no paired beats.  Patient had 2 episodes of SVT/atrial tachycardia with the fastest run being 111 bpm lasting 3 beats in the longest run also lasting 3 beats with an average rate of 100 bpm.  01/2018 Echo - EF 69%,  normal LV diastolic function, no regional wall motion abnormalities, mild LVH, normal RV systolic function, mild to moderate mitral regurgitation, moderate tricuspid regurgitation, mild pulmonary regurgitation, trivial pericardial effusion was noted without hemodynamic compromise or tamponade  physiology.  01/2018 Carotid artery ultrasound - no significant carotid artery disease with antegrade flow in the vertebrals.  01/2018 Nuclear stress test - equivocal with normal LVEF.  Recommendation to proceed with cardiac CTA.  01/2018 Cardiac CTA - calcium score 43.2 with initial CTA being deferred secondary to contrast allergy.  Repeat cardiac CTA 3 days later showed a calcium score of 40 with a left dominant system and minor luminal irregularities in the mid LAD with normal LCx and a small nondominant RCA. _____________________________________________  Patient comes in doing well from a cardiac perspective.  Over the past several days he has not had any further chest pain and is currently chest pain-free.  She denies any palpitations, shortness of breath, presyncope, or syncope.  She does continue to note intermittent dizziness.  No lower extremity swelling, abdominal distention, orthopnea, PND, early satiety.  She reports eating a vegan diet and prefers to follow holistic medicine and avoid Western medicine if possible.  Blood pressure remains well controlled in the 120s over 70s.  She has follow-up with [facility] oncology at the end of this week.  Past Medical History:  Diagnosis Date  . Asthma    controlled  . Dyspnea 12/2017   difficulty getting her breath at all times  . GERD (gastroesophageal reflux disease)   . H/O: GI bleed   . Hypertension   . Mitral valve prolapse   . Osteoarthritis    knees    Past Surgical History:  Procedure Laterality Date  . ABDOMINAL HYSTERECTOMY  2002  . ANTERIOR FUSION CERVICAL SPINE  2009  . COLONOSCOPY  2013   normal  . ELECTROMAGNETIC NAVIGATION BROCHOSCOPY Left 12/12/2017   Procedure: ELECTROMAGNETIC NAVIGATION BRONCHOSCOPY;  Surgeon: Flora Lipps, MD;  Location: [facility] ORS;  Service: Cardiopulmonary;  Laterality: Left;  . ESOPHAGOGASTRODUODENOSCOPY  1990   GI bleed  . FLEXIBLE BRONCHOSCOPY Left 12/12/2017   Procedure: FLEXIBLE BRONCHOSCOPY;   Surgeon: Flora Lipps, MD;  Location: [facility] ORS;  Service: Cardiopulmonary;  Laterality: Left;  . KNEE ARTHROSCOPY Right 2010   x3  . KNEE SURGERY Left    Arthroscopy x1  . LAPAROSCOPIC SUPRACERVICAL HYSTERECTOMY  2002   with BSO for fibroids/ AUB/ pelvic pain  . LAPAROSCOPIC TUBAL LIGATION  1982  . SHOULDER SURGERY Right    rotator cuff repair  . SHOULDER SURGERY Right 2011   second rotator cuff repair  . tubal reanastamosis  1985    Current Meds  Medication Sig  . Biotin 10000 MCG TABS Take 1 tablet by mouth daily.  . carvedilol (COREG) 6.25 MG tablet Take 6.25 mg by mouth 2 (two) times daily.  . Cholecalciferol (VITAMIN D3) 50000 units CAPS TAKE ONE CAPSULE BY MOUTH ONE TIME PER WEEK  . estradiol (ESTRACE) 2 MG tablet Take 1 tablet (2 mg total) by mouth daily.  . metoprolol tartrate (LOPRESSOR) 50 MG tablet Take by mouth.  . Multiple Vitamin (MULTI VITAMIN DAILY PO) Take by mouth daily.  . VENTOLIN HFA 108 (90 Base) MCG/ACT inhaler Inhale 2 puffs into the lungs every 6 (six) hours as needed.     Allergies:   Shellfish allergy; Tramadol; Ascorbic acid; Celecoxib; Gabapentin; Hydrocodone; Hydrocodone-acetaminophen; Ibuprofen; Naproxen; Prednisone; Singulair [montelukast]; Strawberry extract; Codeine; Doxycycline; Eggs or egg-derived products; Latex; Nsaids; Strawberry flavor; and Sulfamethoxazole-trimethoprim  Social History:  The patient  reports that she has quit smoking. She has a 13.00 pack-year smoking history. She has never used smokeless tobacco. She reports previous alcohol use. She reports that she does not use drugs.   Family History:  The patient's family history includes Arthritis in her father; Dementia in her mother; Diabetes in her mother; Glaucoma in her mother; Hypertension in her mother; Osteoporosis in her father; Prostate cancer in her brother; Prostate cancer (age of onset: 16) in her father; Stroke in her father.  ROS:   Review of Systems  Constitutional:  Positive for malaise/fatigue. Negative for chills, diaphoresis, fever and weight loss.  HENT: Negative for congestion.   Eyes: Negative for discharge and redness.  Respiratory: Negative for cough, hemoptysis, sputum production, shortness of breath and wheezing.   Cardiovascular: Positive for chest pain. Negative for palpitations, orthopnea, claudication, leg swelling and PND.  Gastrointestinal: Negative for abdominal pain, blood in stool, heartburn, melena, nausea and vomiting.  Genitourinary: Negative for hematuria.  Musculoskeletal: Negative for falls and myalgias.  Skin: Negative for rash.  Neurological: Positive for dizziness and weakness. Negative for tingling, tremors, sensory change, speech change, focal weakness and loss of consciousness.  Endo/Heme/Allergies: Does not bruise/bleed easily.  Psychiatric/Behavioral: Negative for substance abuse. The patient is nervous/anxious.   All other systems reviewed and are negative.    PHYSICAL EXAM:  VS:  BP 122/70 (BP Location: Left Arm, Patient Position: Sitting, Cuff Size: Normal)   Pulse 75   Ht _0  (1.549 m)   Wt 157 lb 8 oz (71.4 kg)   BMI 29.76 kg/m  BMI: Body mass index is 29.76 kg/m.  Physical Exam  Constitutional: She is oriented to person, place, and time. She appears well-developed and well-nourished.  HENT:  Head: Normocephalic and atraumatic.  Eyes: Right eye exhibits no discharge. Left eye exhibits no discharge.  Neck: Normal range of motion. No JVD present.  Cardiovascular: Normal rate, regular rhythm, S1 normal and S2 normal. Exam reveals no distant heart sounds, no friction rub, no midsystolic click and no opening snap.  Murmur heard. High-pitched blowing holosystolic murmur is present with a grade of 1/6 at the apex. Pulses:      Posterior tibial pulses are 2+ on the right side and 2+ on the left side.  Pulmonary/Chest: Effort normal and breath sounds normal. No respiratory distress. She has no decreased breath  sounds. She has no wheezes. She has no rales. She exhibits no tenderness.  Abdominal: Soft. She exhibits no distension. There is no abdominal tenderness.  Musculoskeletal:        General: No edema.  Neurological: She is alert and oriented to person, place, and time.  Skin: Skin is warm and dry. No cyanosis. Nails show no clubbing.  Psychiatric: She has a normal mood and affect. Her speech is normal and behavior is normal. Judgment and thought content normal.     EKG:  Was ordered and interpreted by me today. Shows NSR, 75 bpm, nonspecific ST-T changes  Recent Labs: 01/27/2018: ALT 12; B Natriuretic Peptide 44.0 02/03/2018: TSH 1.543 02/04/2018: BUN 10; Creatinine, Ser 0.64; Hemoglobin 11.5; Platelets 316; Potassium 4.4; Sodium 139  02/03/2018: Cholesterol 287; HDL 85; LDL Cholesterol 136; Total CHOL/HDL Ratio 3.4; Triglycerides 329; VLDL 66   CrCl cannot be calculated (Patient's most recent lab result is older than the maximum 21 days allowed.).   Wt Readings from Last 3 Encounters:  02/28/18 157 lb 8 oz (71.4 kg)  02/04/18 155 lb 8 oz (  70.5 kg)  02/03/18 160 lb (72.6 kg)    Orthostatic vital signs: Lying: 128/85, 71 bpm Sitting: 124/79, 71 bpm Standing: 124/83, 80 bpm Standing x3 minutes: 132/87, 77 bpm   Other studies reviewed: Additional studies/records reviewed today include: summarized above  ASSESSMENT AND PLAN:  1. Atypical chest pain: Frequent ED evaluations and hospital admission for recurrent chest pain in the setting of undiagnosed thoracic mass that has possibly been felt to be neuroendocrine which certainly may be playing a role in her symptoms and fluctuating blood pressure.  Multiple sets of cardiac enzymes have been negative.  Recent outpatient cardiac testing including cardiac CTA showing nonobstructive disease as outlined above.  In this setting, no further ischemic cardiac testing is needed at this time.  Recommend to follow-up with [facility] oncology.  2. 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  Followed by oncology with upcoming appointment at [facility] for CT-guided biopsy.  Follow-up as directed.  3. Nonobstructive coronary artery disease: Coronary artery calcifications were noted on prior chest CT. follow-up cardiac CTA demonstrated nonobstructive disease.  Recent LDL noted to be 136.  It was recommended she be started on Crestor during recent hospital admission.  However, the patient prefers holistic medicine and did not start this.  She continues to decline statin initiation at this time.  Aggressive risk factor modification.  Recent nonischemic evaluation as outlined in the HPI.  4. Tachycardia: Outpatient cardiac monitoring pending given the patient's thoracic mass.  It is unclear why the patient was continued on Coreg and Lopressor at her recent hospital discharge, fortunately, patient has only been taking Coreg which will be continued at this time.  5. HTN: Blood pressure well controlled.  Continue current medication.  6. HLD: LDL noted to be 136 during recent admission.  Given coronary artery calcification it was recommended she be started on Crestor 20 mg daily.  However, as above patient prefers to defer this at this time.  Recommend repeat fasting lipid panel in follow-up.    7. Mild to moderate mitral regurgitation: Noted on echo in 01/2018.  Continue to monitor clinically with periodic echocardiogram.  Disposition: F/u with Dr. Rockey Situ or an APP in 6 months.  Current medicines are reviewed at length with the patient today.  The patient did not have any concerns regarding medicines.  Signed, Christell Faith, PA-C 02/28/2018 10:16 AM     [facility] - [address] [place], [place] [address] [phone]

## 2018-02-28 ENCOUNTER — Ambulatory Visit (INDEPENDENT_AMBULATORY_CARE_PROVIDER_SITE_OTHER): Payer: Medicare Other | Admitting: Physician Assistant

## 2018-02-28 ENCOUNTER — Encounter: Payer: Self-pay | Admitting: Physician Assistant

## 2018-02-28 VITALS — BP 122/70 | HR 75 | Ht 61.0 in | Wt 157.5 lb

## 2018-02-28 DIAGNOSIS — E782 Mixed hyperlipidemia: Secondary | ICD-10-CM

## 2018-02-28 DIAGNOSIS — I340 Nonrheumatic mitral (valve) insufficiency: Secondary | ICD-10-CM

## 2018-02-28 DIAGNOSIS — R0789 Other chest pain: Secondary | ICD-10-CM

## 2018-02-28 DIAGNOSIS — R002 Palpitations: Secondary | ICD-10-CM | POA: Diagnosis not present

## 2018-02-28 DIAGNOSIS — I2510 Atherosclerotic heart disease of native coronary artery without angina pectoris: Secondary | ICD-10-CM | POA: Diagnosis not present

## 2018-02-28 DIAGNOSIS — R222 Localized swelling, mass and lump, trunk: Secondary | ICD-10-CM

## 2018-02-28 DIAGNOSIS — I10 Essential (primary) hypertension: Secondary | ICD-10-CM

## 2018-02-28 NOTE — Patient Instructions (Signed)
Medication Instructions:  Your physician recommends that you continue on your current medications as directed. Please refer to the Current Medication list given to you today.  If you need a refill on your cardiac medications before your next appointment, please call your pharmacy.   Lab work: None ordered  If you have labs (blood work) drawn today and your tests are completely normal, you will receive your results only by: Marland Kitchen MyChart Message (if you have MyChart) OR . A paper copy in the mail If you have any lab test that is abnormal or we need to change your treatment, we will call you to review the results.  Testing/Procedures: None ordered   Follow-Up: At [facility], you and your health needs are our priority.  As part of our continuing mission to provide you with exceptional heart care, we have created designated Provider Care Teams.  These Care Teams include your primary Cardiologist (physician) and Advanced Practice Providers (APPs -  Physician Assistants and Nurse Practitioners) who all work together to provide you with the care you need, when you need it. You will need a follow up appointment in 6 months.  Please call our office 2 months in advance to schedule this appointment.  You may see Dr. Rockey Situ or Christell Faith, PA-C.

## 2018-03-03 ENCOUNTER — Inpatient Hospital Stay: Payer: Medicare Other | Admitting: Hematology and Oncology

## 2018-03-03 NOTE — Progress Notes (Deleted)
[facility] day:  03/03/2018  Chief Complaint: Carmen Grimes is a 58 y.o. female with a lingular and left lower lobe lesion who is seen for reassessment.  HPI:   The patient was last seen in the medical oncology clinic on 02/01/2018 for initial consultation.  She had a lingular and left lower lobe lesion.  She was presented at tumor board.  Recommendations were for close monitoring.  She was referred to [facility].  She was admitted to [facility] from 02/03/2018 - 02/04/2018 with recurrent chest pain.  Cardiac evaluation was negative.  Work-up for a possible carcinoid tumor was pursued. Chromogranin A was 85.5 (0-101.8).  24 hour urine for 5HIAA was 5.4 mg/24 hrs.  Normetanephrine 15 (0-145) and metanephrine < 10 (0-62).  TSH was 1.543.  She was seen by Dr. Aniceto Boss on 02/06/2018.  Normetanephrine was 43 (0-145) and metanephrine was < 10.  Paraneoplastic panel on 01/31/2018 was negative.  During the interim,   Past Medical History:  Diagnosis Date  . Asthma    controlled  . Dyspnea 12/2017   difficulty getting her breath at all times  . GERD (gastroesophageal reflux disease)   . H/O: GI bleed   . Hypertension   . Mitral valve prolapse   . Osteoarthritis    knees    Past Surgical History:  Procedure Laterality Date  . ABDOMINAL HYSTERECTOMY  2002  . ANTERIOR FUSION CERVICAL SPINE  2009  . COLONOSCOPY  2013   normal  . ELECTROMAGNETIC NAVIGATION BROCHOSCOPY Left 12/12/2017   Procedure: ELECTROMAGNETIC NAVIGATION BRONCHOSCOPY;  Surgeon: Flora Lipps, MD;  Location: [facility] ORS;  Service: Cardiopulmonary;  Laterality: Left;  . ESOPHAGOGASTRODUODENOSCOPY  1990   GI bleed  . FLEXIBLE BRONCHOSCOPY Left 12/12/2017   Procedure: FLEXIBLE BRONCHOSCOPY;  Surgeon: Flora Lipps, MD;  Location: [facility] ORS;  Service: Cardiopulmonary;  Laterality: Left;  . KNEE ARTHROSCOPY Right 2010   x3  . KNEE SURGERY Left    Arthroscopy x1  . LAPAROSCOPIC SUPRACERVICAL  HYSTERECTOMY  2002   with BSO for fibroids/ AUB/ pelvic pain  . LAPAROSCOPIC TUBAL LIGATION  1982  . SHOULDER SURGERY Right    rotator cuff repair  . SHOULDER SURGERY Right 2011   second rotator cuff repair  . tubal reanastamosis  1985    Family History  Problem Relation Age of Onset  . Diabetes Mother   . Dementia Mother        Alzheimers  . Hypertension Mother   . Glaucoma Mother   . Prostate cancer Father 91  . Stroke Father   . Arthritis Father   . Osteoporosis Father   . Prostate cancer Brother   . Breast cancer Neg Hx     Social History:  reports that she has quit smoking. She has a 13.00 pack-year smoking history. She has never used smokeless tobacco. She reports previous alcohol use. She reports that she does not use drugs.  Former 1 ppd smoker for 13 years. Former ETOH use when she was younger; none now. Lives in [place] since 2018; moved from [place].  Patient is a Building control surveyor.  She is planning on going to MRI school (she was to start in 01/2018). [employer]; adheres to strict vegan diet.  She has a dog and a cat.  The patient is alone today.  Allergies:  Allergies  Allergen Reactions  . Shellfish Allergy Anaphylaxis    Other reaction(s): anaphylaxis/angioedema Other reaction(s): anaphylaxis/angioedema  Betadine on skin  is okay  . Tramadol Other (See Comments)    "blacked out", found wandering in the street  . Ascorbic Acid Hives and Nausea And Vomiting    HIVES / VOMITING   . Celecoxib Other (See Comments)    GI bleed Other reaction(s): gi distress, Other (comments), Other (See Comments) GI BLEED      GI bleeding  GI BLEED  GI Bleeding   GI bleed Other reaction(s): gi distress, Other (comments), Other (See Comments) GI BLEED  . Gabapentin Other (See Comments)    Drowsiness. Like a zombie, not functional   . Hydrocodone Other (See Comments)    Hair falls out.  Marland Kitchen Hydrocodone-Acetaminophen     Other  reaction(s): other/intolerance HAIR FALLS OUT   . Ibuprofen Other (See Comments)    "Blood disorder", GI bleed. Other reaction(s): gi distress, Other (comments), Other (See Comments)   . Naproxen Other (See Comments)    GI bleed Other reaction(s): Other (comments), Other (See Comments)        . Prednisone Other (See Comments)    Tremors, severe  . Singulair [Montelukast] Other (See Comments)    Syncope..passed out  . Strawberry Extract Hives, Nausea And Vomiting and Nausea Only    Severe vomitting  . Codeine Itching and Nausea Only    Other reaction(s): mild rash/itching  . Doxycycline Other (See Comments)    Unknown reaction. Other reaction(s): Other (See Comments), Unknown, Unknown (comments) Pt does not remember   . Eggs Or Egg-Derived Products Rash and Other (See Comments)    rash  . Latex Rash and Itching    Other reaction(s): mild rash/itching  . Nsaids Nausea And Vomiting and Other (See Comments)    Gastric bleed        . Strawberry Flavor Hives  . Sulfamethoxazole-Trimethoprim Other (See Comments)    Other reaction(s): Other (See Comments), other/intolerance MAKES PAIN WORSE  Increases pain Increases pain     Current Medications: Current Outpatient Medications  Medication Sig Dispense Refill  . Biotin 10000 MCG TABS Take 1 tablet by mouth daily.    . carvedilol (COREG) 6.25 MG tablet Take 6.25 mg by mouth 2 (two) times daily.    . Cholecalciferol (VITAMIN D3) 50000 units CAPS TAKE ONE CAPSULE BY MOUTH ONE TIME PER WEEK  1  . estradiol (ESTRACE) 2 MG tablet Take 1 tablet (2 mg total) by mouth daily. 90 tablet 3  . metoprolol tartrate (LOPRESSOR) 50 MG tablet Take by mouth.    . Multiple Vitamin (MULTI VITAMIN DAILY PO) Take by mouth daily.    . VENTOLIN HFA 108 (90 Base) MCG/ACT inhaler Inhale 2 puffs into the lungs every 6 (six) hours as needed.   6   No current facility-administered medications for this visit.     Review of Systems:  GENERAL:   Feels"not like herself".  Feels "hot and cold".  Intermittent sweats.  No weight loss. PERFORMANCE STATUS (ECOG):  1 HEENT:  Saw ophthalmologist this morning.  No visual changes, runny nose, sore throat, mouth sores or tenderness. Lungs: No shortness of breath or cough.  No hemoptysis. Cardiac:  Intermittent chest pain/pressure (see HPI).  No palpitations, orthopnea, or PND. GI:  Poor appetite.  Constipation.  Daily nausea.  No vomiting, diarrhea, constipation, melena or hematochezia. GU:  No urgency, frequency, dysuria, or hematuria. Musculoskeletal:  Arthritis in knee.  Chronic arthralgias secondary to osteoarthritis.  No muscle tenderness. Extremities:  No pain or swelling. Skin:  No rashes or skin changes.  No after  bath itching. Neuro:   Headache.  Feels weak.  Dizzy at times (? Secondary to metoprolol.  No focal numbness or weakness, balance or coordination issues. Endocrine:  No diabetes, thyroid issues, hot flashes or night sweats. Psych:  No mood changes, depression or anxiety. Pain:  No focal pain. Review of systems:  All other systems reviewed and found to be negative.  Physical Exam: There were no vitals taken for this visit. GENERAL:  Well developed, well nourished, woman sitting comfortably in the exam room in no acute distress. MENTAL STATUS:  Alert and oriented to person, place and time. HEAD: Curly dark hair with graying.  Normocephalic, atraumatic, face symmetric, no Cushingoid features. EYES:  Brown eyes.  Slight left amblyopia.  Pupils dilated s/p eye appointment.  Pupils equal round.  No conjunctivitis or scleral icterus. ENT:  Oropharynx clear without lesion.  Tongue normal. Mucous membranes moist.  RESPIRATORY:  Clear to auscultation without rales, wheezes or rhonchi. CARDIOVASCULAR:  Regular rate and rhythm without murmur, rub or gallop. ABDOMEN:  Soft, non-tender, with active bowel sounds, and no hepatosplenomegaly.  No masses. SKIN:  No rashes, ulcers or  lesions. EXTREMITIES: No edema, no skin discoloration or tenderness.  No palpable cords. LYMPH NODES: No palpable cervical, supraclavicular, axillary or inguinal adenopathy  NEUROLOGICAL: Unremarkable. PSYCH:  Appropriate.   No visits with results within 3 Day(s) from this visit.  Latest known visit with results is:  Orders Only on 02/10/2018  Component Date Value Ref Range Status  . 5-HIAA, Ur 02/10/2018 3.4  Undefined mg/L Final   Comment: (NOTE) This test was developed and its performance characteristics determined by [facility]. It has not been cleared or approved by the Food and Drug Administration.   Marland Kitchen 5-HIAA,Quant.,24 Hr Urine 02/10/2018 5.4  0.0 - 14.9 mg/24 hr Final   Comment: (NOTE) Performed At: [facility] [place], [place] [id number] Rush Farmer MD [id number]   . Total Volume 02/10/2018 1,600   Final   Performed at [facility], [address], [place], [place] [address]    Assessment:  Jaylan Duggar is a 58 y.o. female with a lingular lesion and a left lower lobe lesion.  She is s/p electromagnetic navigation bronchoscopy (ENB) of the LUL on 12/12/2017.  Pathology was benign.  She had a pneumothorax after her biopsy.  CEA and LDH were normal on 02/01/2018.  Chest CT on 11/11/2017 revealed a 1.5 x 2.2 cm nodule within the lingula which was thought to represent primary pulmonary malignancy or potentially focal infectious process.  PET scan on 11/21/2017 revealed hypermetabolic lingular opacity similar in size to 11/11/2017.  There was an anterior left lower lobe hypermetabolic opacity, increased compared to 11/11/2017. This short-term progression, as well as morphology, may represent progressive infection.  Neoplasia, especially in the lingula, cannot be excluded.    Chest CT angiogram at [facility] on 01/10/2018 revealed no pulmonary embolism.  There was a 3.2 x 1.7 cm soft tissue mass adjacent to the pericardium by the left  ventricle concerning for malignancy. There was left lingular atelectasis possibly with some mucoid impaction.  Chest CT without contrast at [facility] on 01/20/2018 revealed a 2.5 x 1.0 more flattened lingular nodule (compared to 2.2 x 1.5 cm) on the CT dated 11/11/2017.  The nodule in the anterior aspect of the LEFT lower lobe had increased in size to 3.2 x 1.7 cm (previously 2.9 x 1.9 cm) on PET-CT 11/22/2017.  There were no pathologically enlarged mediastinal or hilar lymph  nodes.  Symptomatically, she feels weak, has intermittent chest pain, diaphoresis, and feels hot and cold.  Exam is unremarkable.  Plan: 1.  Labs today:  LDH, uric acid, CEA. 2.  Lingular and left lower lobe lesion  Review imaging with patient.  Suspect left lower lobe lesion originates in lung rather than pericardium.  If lung cancer T4 lesion.  Discuss obtaining tissue for diagnosis and treatment.  Present at tumor board on 02/02/2018.  Follow-up with Dr Mortimer Fries on 02/03/2018 as planned.    Contact Dr. Mortimer Fries and Dr Genevive Bi- done.  Anticipate biopsy (? CT guided). 3.  Contact patient after tumor board regarding tumor board discussions and plans for biopsy.   Lequita Asal, MD  03/03/2018, 5:15 AM   I saw and evaluated the patient, participating in the key portions of the service and reviewing pertinent diagnostic studies and records.  I reviewed the nurse practitioner's note and agree with the findings and the plan.  The assessment and plan were discussed with the patient.  Multiple questions were asked by the patient and answered.   Nolon Stalls, MD 03/03/2018,5:15 AM

## 2018-03-09 DIAGNOSIS — Z87891 Personal history of nicotine dependence: Secondary | ICD-10-CM

## 2018-03-09 HISTORY — DX: Personal history of nicotine dependence: Z87.891

## 2018-03-12 NOTE — Progress Notes (Signed)
[facility] day:  03/13/2018  Chief Complaint: Carmen Grimes is a 58 y.o. female with a lingular and left lower lobe lesion who is seen for reassessment.  HPI:   The patient was last seen in the medical oncology clinic on 02/01/2018 for initial consultation.  She had a lingular and left lower lobe lesion.  She was presented at tumor board.  Recommendations were for close monitoring.  She was referred to [facility].  She was admitted to [facility] from 02/03/2018 - 02/04/2018 with recurrent chest pain.  Cardiac evaluation was negative.  Work-up for a possible carcinoid tumor was pursued. Chromogranin A was 85.5 (0-101.8).  24 hour urine for 5HIAA was 5.4 mg/24 hrs.  Normetanephrine 15 (0-145) and metanephrine < 10 (0-62).  TSH was 1.543.  She was seen by Dr. Aniceto Boss on 02/06/2018.  Normetanephrine was 43 (0-145) and metanephrine was < 10.  Paraneoplastic panel on 01/31/2018 was negative.  Cardiac MRI on 03/03/2018 revealed a 2.5 x 0.9 x 2.0 cm soft tissue mass adjacent to basal-mid inferolateral wall of the LV. The mass contacted the pericardium, but appeared separate from the heart without obvious invasion (suggesting that it was based in the lung tissue).  There was a second soft tissue mass 1.3 x 0.7 x cm x 2.1 cm) that adjacent to the apical anterolateral wall. The mass appeared to contacts the pericardium, but appeared separate from the heart without obvious invasion. The mass appeared to extend into the inferior segment of the ligula. The mass also appeared separate from the pericardium or the heart.  The patient was seen by Dr. Carlye Grippe of medical oncology.  The lesions appeared vascular.  The patient was presented at the thoracic multidisciplinary conference on 03/08/2018.  Consensus recommendation was for surgical excision of these lesions. She is scheduled for surgery on 03/28/2018.  During the interim, patient continues to experience episodes of  tachycardia and issues with her blood pressure. She notes dizziness and issues with being lightheaded "all of the time".  She is fatigued and short of breath at times. Patient notes that she has been using her MDI more frequently. Patient is very anxious about her upcoming surgery later this month. Patient denies that she has experienced any B symptoms. She denies any interval infections. Patient has been intermittently on an OTC herbal preparation (Wellness formula) since 2007 to prevent infection/illness.   Patient advises that she maintains an adequate appetite. She is eating well. Weight today is 156 lb 13.7 oz (71.2 kg), which compared to her last visit to the clinic, represents a 3 pound decrease.    Patient denies pain in the clinic today.   Past Medical History:  Diagnosis Date  . Asthma    controlled  . Dyspnea 12/2017   difficulty getting her breath at all times  . GERD (gastroesophageal reflux disease)   . H/O: GI bleed   . Hypertension   . Mitral valve prolapse   . Osteoarthritis    knees    Past Surgical History:  Procedure Laterality Date  . ABDOMINAL HYSTERECTOMY  2002  . ANTERIOR FUSION CERVICAL SPINE  2009  . COLONOSCOPY  2013   normal  . ELECTROMAGNETIC NAVIGATION BROCHOSCOPY Left 12/12/2017   Procedure: ELECTROMAGNETIC NAVIGATION BRONCHOSCOPY;  Surgeon: Flora Lipps, MD;  Location: [facility] ORS;  Service: Cardiopulmonary;  Laterality: Left;  . ESOPHAGOGASTRODUODENOSCOPY  1990   GI bleed  . FLEXIBLE BRONCHOSCOPY Left 12/12/2017   Procedure: FLEXIBLE BRONCHOSCOPY;  Surgeon: Mortimer Fries,  Maretta Bees, MD;  Location: [facility] ORS;  Service: Cardiopulmonary;  Laterality: Left;  . KNEE ARTHROSCOPY Right 2010   x3  . KNEE SURGERY Left    Arthroscopy x1  . LAPAROSCOPIC SUPRACERVICAL HYSTERECTOMY  2002   with BSO for fibroids/ AUB/ pelvic pain  . LAPAROSCOPIC TUBAL LIGATION  1982  . SHOULDER SURGERY Right    rotator cuff repair  . SHOULDER SURGERY Right 2011   second rotator cuff repair   . tubal reanastamosis  1985    Family History  Problem Relation Age of Onset  . Diabetes Mother   . Dementia Mother        Alzheimers  . Hypertension Mother   . Glaucoma Mother   . Prostate cancer Father 25  . Stroke Father   . Arthritis Father   . Osteoporosis Father   . Prostate cancer Brother   . Breast cancer Neg Hx     Social History:  reports that she has quit smoking. She has a 13.00 pack-year smoking history. She has never used smokeless tobacco. She reports previous alcohol use. She reports that she does not use drugs.  Former 1 ppd smoker for 13 years. Former ETOH use when she was younger; none now. Lives in [place] since 2018; moved from [place].  Patient is a Building control surveyor.  She is planning on going to MRI school (she was to start in 01/2018). [employer]; adheres to strict vegan diet.  She has a dog and a cat.  The patient is alone today.  Allergies:  Allergies  Allergen Reactions  . Shellfish Allergy Anaphylaxis    Other reaction(s): anaphylaxis/angioedema Other reaction(s): anaphylaxis/angioedema  Betadine on skin is okay  . Tramadol Other (See Comments)    "blacked out", found wandering in the street  . Celecoxib Other (See Comments)    GI bleed Other reaction(s): gi distress, Other (comments), Other (See Comments) GI BLEED      GI bleeding  GI BLEED  GI Bleeding   GI bleed Other reaction(s): gi distress, Other (comments), Other (See Comments) GI BLEED  . Gabapentin Other (See Comments)    Drowsiness. Like a zombie, not functional   . Hydrocodone Other (See Comments)    Hair falls out.  Marland Kitchen Hydrocodone-Acetaminophen     Other reaction(s): other/intolerance HAIR FALLS OUT   . Ibuprofen Other (See Comments)    "Blood disorder", GI bleed. Other reaction(s): gi distress, Other (comments), Other (See Comments)   . Naproxen Other (See Comments)    GI bleed Other reaction(s): Other (comments), Other  (See Comments)        . Prednisone Other (See Comments)    Tremors, severe  . Singulair [Montelukast] Other (See Comments)    Syncope..passed out  . Strawberry Extract Hives, Nausea And Vomiting and Nausea Only    Severe vomitting  . Codeine Itching and Nausea Only    Other reaction(s): mild rash/itching  . Doxycycline Other (See Comments)    Unknown reaction. Other reaction(s): Other (See Comments), Unknown, Unknown (comments) Pt does not remember   . Eggs Or Egg-Derived Products Rash and Other (See Comments)    rash  . Latex Rash and Itching    Other reaction(s): mild rash/itching  . Nsaids Nausea And Vomiting and Other (See Comments)    Gastric bleed        . Strawberry Flavor Hives  . Sulfamethoxazole-Trimethoprim Other (See Comments)    Other reaction(s): Other (See Comments), other/intolerance MAKES PAIN WORSE  Increases pain  Increases pain     Current Medications: Current Outpatient Medications  Medication Sig Dispense Refill  . Biotin 10000 MCG TABS Take 1 tablet by mouth daily.    . carvedilol (COREG) 6.25 MG tablet Take 6.25 mg by mouth 2 (two) times daily.    Marland Kitchen estradiol (ESTRACE) 2 MG tablet Take 1 tablet (2 mg total) by mouth daily. 90 tablet 3  . Multiple Vitamin (MULTI VITAMIN DAILY PO) Take by mouth daily.    . VENTOLIN HFA 108 (90 Base) MCG/ACT inhaler Inhale 2 puffs into the lungs every 6 (six) hours as needed.   6   No current facility-administered medications for this visit.     Review of Systems:  GENERAL:  Feels "fatigued at times".  No fevers, sweats.  Weight down 3 pounds. PERFORMANCE STATUS (ECOG):  1 HEENT:  No visual changes, runny nose, sore throat, mouth sores or tenderness. Lungs:  Shortness of breath at times.  No cough.  No hemoptysis. Cardiac:  Episodes of tachycardia.  Issues with blood pressure.  No chest pain, orthopnea, or PND. GI:  Occasional nausea.  No vomiting, diarrhea, constipation, melena or hematochezia. GU:  No  urgency, frequency, dysuria, or hematuria. Musculoskeletal:  No back pain.  Arthritis in knee.  No muscle tenderness. Extremities:  No pain or swelling. Skin:  No rashes or skin changes. Neuro:  "Dizzy and lightheaded at times".  No headache, numbness or weakness, balance or coordination issues. Endocrine:  No diabetes, thyroid issues, hot flashes or night sweats. Psych:  Nervous about upcoming surgery.  No depression. Pain:  No focal pain. Review of systems:  All other systems reviewed and found to be negative.   Physical Exam: Blood pressure 135/88, pulse 84, temperature 97.8 F (36.6 C), resp. rate 16, weight 156 lb 13.7 oz (71.2 kg), SpO2 100 %. GENERAL:  Well developed, well nourished, woman sitting comfortably in the exam room in no acute distress. MENTAL STATUS:  Alert and oriented to person, place and time. HEAD:  Wearing a head wrap.  Normocephalic, atraumatic, face symmetric, no Cushingoid features. EYES:  Brown eyes.  Slight left amblyopia.  Pupils equal round and reactive to light and accomodation.  No conjunctivitis or scleral icterus. ENT:  Oropharynx clear without lesion.  Tongue normal. Mucous membranes moist.  RESPIRATORY:  Clear to auscultation without rales, wheezes or rhonchi. CARDIOVASCULAR:  Regular rate and rhythm without murmur, rub or gallop. ABDOMEN:  Soft, non-tender, with active bowel sounds, and no hepatosplenomegaly.  No masses. SKIN:  No rashes, ulcers or lesions. EXTREMITIES: No edema, no skin discoloration or tenderness.  No palpable cords. LYMPH NODES: No palpable cervical, supraclavicular, axillary or inguinal adenopathy  NEUROLOGICAL: Unremarkable. PSYCH:  Appropriate.  Smiling.   No visits with results within 3 Day(s) from this visit.  Latest known visit with results is:  Orders Only on 02/10/2018  Component Date Value Ref Range Status  . 5-HIAA, Ur 02/10/2018 3.4  Undefined mg/L Final   Comment: (NOTE) This test was developed and its performance  characteristics determined by [facility]. It has not been cleared or approved by the Food and Drug Administration.   Marland Kitchen 5-HIAA,Quant.,24 Hr Urine 02/10/2018 5.4  0.0 - 14.9 mg/24 hr Final   Comment: (NOTE) Performed At: [facility], [place] [id number] Rush Farmer MD AS:[id number]   . Total Volume 02/10/2018 1,600   Final   Performed at [facility], [address], [place], [place] [address]    Assessment:  Carmen  Grimes is a 57 y.o. female with a lingular lesion and a left lower lobe lesion.  She is s/p electromagnetic navigation bronchoscopy (ENB) of the LUL on 12/12/2017.  Pathology was benign.  She had a pneumothorax after her biopsy.  CEA and LDH were normal on 02/01/2018.  Chest CT on 11/11/2017 revealed a 1.5 x 2.2 cm nodule within the lingula which was thought to represent primary pulmonary malignancy or potentially focal infectious process.  PET scan on 11/21/2017 revealed hypermetabolic lingular opacity similar in size to 11/11/2017.  There was an anterior left lower lobe hypermetabolic opacity, increased compared to 11/11/2017. This short-term progression, as well as morphology, may represent progressive infection.  Neoplasia, especially in the lingula, cannot be excluded.    Chest CT angiogram at [facility] on 01/10/2018 revealed no pulmonary embolism.  There was a 3.2 x 1.7 cm soft tissue mass adjacent to the pericardium by the left ventricle concerning for malignancy. There was left lingular atelectasis possibly with some mucoid impaction.  Chest CT without contrast at [facility] on 01/20/2018 revealed a 2.5 x 1.0 more flattened lingular nodule (compared to 2.2 x 1.5 cm) on the CT dated 11/11/2017.  The nodule in the anterior aspect of the LEFT lower lobe had increased in size to 3.2 x 1.7 cm (previously 2.9 x 1.9 cm) on PET-CT 11/22/2017.  There were no pathologically enlarged mediastinal or hilar lymph nodes.  Cardiac MRI on  03/03/2018 revealed a 2.5 x 0.9 x 2.0 cm soft tissue mass adjacent to basal-mid inferolateral wall of the LV. The mass contacted the pericardium, but appeared separate from the heart without obvious invasion (suggesting that it was based in the lung tissue).  There was a second soft tissue mass 1.3 x 0.7 x cm x 2.1 cm) that adjacent to the apical anterolateral wall. The mass appeared to contacts the pericardium, but appeared separate from the heart without obvious invasion. The mass appeared to extend into the inferior segment of the ligula. The mass also appeared separate from the pericardium or the heart.  Chromogranin A was 85.5 (0-101.8).  24 hour urine for 5HIAA was 5.4 mg/24 hrs.  Normetanephrine was 15 (0-145) and metanephrine < 10 (0-62) at [facility].  TSH was 1.543.  Normetanephrine was 43 (0-145) and metanephrine was < 10 at [facility].  Paraneoplastic panel on 01/31/2018 was negative.  She is scheduled for surgery at [facility] on 03/28/2018.  Symptomatically, she has intermittent tachycardia, shortness of breath, and lightheadedness.  Exam is unremarkable.  Plan: 1.  Lingular and left lower lobe lesion  Discuss evaluation at [facility].  Interval cardiac MRI reveals 2 lesions separate from the heart and originating in the lung.  Patient scheduled for thoracic surgery on 03/28/2018 for removal of these lesions.  Patient concerned about recurrent pneumothorax.  Discuss temporary chest tube placement after surgery.  Await pathology regarding direction of therapy after surgery.  Discuss plans to follow-up after pathology returns. 2.  RTC in 2 -3 weeks after surgery for MD assessment, review of pathology, and discussion regarding direction of therapy.  Patient to call.Honor Loh, NP  03/13/2018, 9:13 AM   I saw and evaluated the patient, participating in the key portions of the service and reviewing pertinent diagnostic studies and records.  I reviewed the nurse practitioner's note and agree with the  findings and the plan.  The assessment and plan were discussed with the patient.  Several questions were asked by the patient and answered.   Nolon Stalls, MD  03/13/2018,9:13 AM

## 2018-03-13 ENCOUNTER — Telehealth: Payer: Self-pay | Admitting: *Deleted

## 2018-03-13 ENCOUNTER — Inpatient Hospital Stay: Payer: Medicare Other | Attending: Hematology and Oncology | Admitting: Hematology and Oncology

## 2018-03-13 ENCOUNTER — Encounter: Payer: Self-pay | Admitting: Hematology and Oncology

## 2018-03-13 VITALS — BP 148/90 | HR 84 | Temp 97.8°F | Resp 16 | Wt 156.9 lb

## 2018-03-13 DIAGNOSIS — R911 Solitary pulmonary nodule: Secondary | ICD-10-CM | POA: Insufficient documentation

## 2018-03-13 DIAGNOSIS — Z87891 Personal history of nicotine dependence: Secondary | ICD-10-CM

## 2018-03-13 DIAGNOSIS — R918 Other nonspecific abnormal finding of lung field: Secondary | ICD-10-CM

## 2018-03-13 NOTE — Progress Notes (Signed)
Pt here for follow up. Denies any concerns at this time.  

## 2018-03-13 NOTE — Telephone Encounter (Signed)
-----   Message from Minna Merritts, MD sent at 03/12/2018  9:21 PM EDT ----- Rare short runs of tachycardia Normal rhythm otherwise Patient triggered events were not associated withy significant arrhythmia

## 2018-03-13 NOTE — Telephone Encounter (Signed)
Left voicemail message to call back for results.  

## 2018-03-13 NOTE — Telephone Encounter (Signed)
Patient calling to discuss recent testing results  ° °Please call  ° °

## 2018-03-14 NOTE — Telephone Encounter (Signed)
Patient  Made aware of cardiac monitor results with verbalized understanding.

## 2018-03-19 ENCOUNTER — Other Ambulatory Visit: Payer: Self-pay

## 2018-03-19 ENCOUNTER — Encounter: Payer: Self-pay | Admitting: Emergency Medicine

## 2018-03-19 ENCOUNTER — Emergency Department
Admission: EM | Admit: 2018-03-19 | Discharge: 2018-03-19 | Disposition: A | Discharge status: Home / Self Care | Payer: Medicare Other | Attending: Emergency Medicine | Admitting: Emergency Medicine

## 2018-03-19 DIAGNOSIS — R55 Syncope and collapse: Secondary | ICD-10-CM | POA: Diagnosis present

## 2018-03-19 DIAGNOSIS — I10 Essential (primary) hypertension: Secondary | ICD-10-CM | POA: Diagnosis not present

## 2018-03-19 DIAGNOSIS — Z87891 Personal history of nicotine dependence: Secondary | ICD-10-CM | POA: Diagnosis not present

## 2018-03-19 DIAGNOSIS — Z79899 Other long term (current) drug therapy: Secondary | ICD-10-CM | POA: Diagnosis not present

## 2018-03-19 DIAGNOSIS — J45909 Unspecified asthma, uncomplicated: Secondary | ICD-10-CM | POA: Diagnosis not present

## 2018-03-19 DIAGNOSIS — Z91040 Latex allergy status: Secondary | ICD-10-CM | POA: Diagnosis not present

## 2018-03-19 LAB — CBC
HCT: 37.9 % (ref 36.0–46.0)
Hemoglobin: 12.4 g/dL (ref 12.0–15.0)
MCH: 30.4 pg (ref 26.0–34.0)
MCHC: 32.7 g/dL (ref 30.0–36.0)
MCV: 92.9 fL (ref 80.0–100.0)
Platelets: 307 10*3/uL (ref 150–400)
RBC: 4.08 MIL/uL (ref 3.87–5.11)
RDW: 12 % (ref 11.5–15.5)
WBC: 10.1 10*3/uL (ref 4.0–10.5)
nRBC: 0 % (ref 0.0–0.2)

## 2018-03-19 LAB — COMPREHENSIVE METABOLIC PANEL WITH GFR
ALT: 13 U/L (ref 0–44)
AST: 22 U/L (ref 15–41)
Albumin: 4.2 g/dL (ref 3.5–5.0)
Alkaline Phosphatase: 58 U/L (ref 38–126)
Anion gap: 8 (ref 5–15)
BUN: 10 mg/dL (ref 6–20)
CO2: 28 mmol/L (ref 22–32)
Calcium: 9 mg/dL (ref 8.9–10.3)
Chloride: 100 mmol/L (ref 98–111)
Creatinine, Ser: 0.8 mg/dL (ref 0.44–1.00)
GFR calc Af Amer: >60 mL/min
GFR calc non Af Amer: >60 mL/min
Glucose, Bld: 108 mg/dL — ABNORMAL HIGH (ref 70–99)
Potassium: 3.8 mmol/L (ref 3.5–5.1)
Sodium: 136 mmol/L (ref 135–145)
Total Bilirubin: 0.2 mg/dL — ABNORMAL LOW (ref 0.3–1.2)
Total Protein: 7.3 g/dL (ref 6.5–8.1)

## 2018-03-19 LAB — TROPONIN I
Troponin I: <0.03 ng/mL
Troponin I: <0.03 ng/mL

## 2018-03-19 MED ORDER — SODIUM CHLORIDE 0.9 % IV BOLUS
1000.0000 mL | Freq: Once | INTRAVENOUS | Status: AC
  Administered 2018-03-19: 1000 mL via INTRAVENOUS

## 2018-03-19 NOTE — ED Triage Notes (Signed)
Pt reports she was praying and all of a sudden got dizzy, hot, sweaty and felt like was going to pass out.  She reports took blood pressure at that time and bp was 61/48. Pt still feels shaky but reports does not feel like going to pass out anymore.  No pain.  VSS,

## 2018-03-19 NOTE — ED Provider Notes (Signed)
[facility] Emergency Department Provider Note  Time seen: 8:57 AM  I have reviewed the triage vital signs and the nursing notes.   HISTORY  Chief Complaint Near Syncope    HPI Carmen Grimes is a 58 y.o. female with a past medical history of asthma, gastric reflux, hypertension, presents to the emergency department for near syncope.  According to the patient this morning she states she was praying when she all of a sudden became very nauseated lightheaded and sweaty.  Patient states she went to the kitchen and made herself a smoothie to see if it make her feel better but it did not.  Patient states initially when she began feeling unwell she took her blood pressure and it was in the 25K systolic.  States after she ate a smoothie she checked it again it was 130/90 which concerned her as well because it was too high.  Patient came to the emergency department for evaluation.  Denies any chest pain or shortness of breath.   Past Medical History:  Diagnosis Date  . Asthma    controlled  . Dyspnea 12/2017   difficulty getting her breath at all times  . GERD (gastroesophageal reflux disease)   . H/O: GI bleed   . Hypertension   . Mitral valve prolapse   . Osteoarthritis    knees    Patient Active Problem List   Diagnosis Date Noted  . H/O chest tube placement   . Pneumothorax 12/12/2017  . Lung nodule   . Degeneration of lumbar intervertebral disc 12/05/2017  . Cervical spondylosis without myelopathy 06/26/2017  . 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 Synovitis of right hand 02/01/2017  . H/O: GI bleed   . Osteoarthritis   . GERD (gastroesophageal reflux disease)   . Asthma   . Vitamin D deficiency 07/16/2016  . Anxiety 07/12/2016  . Acute pain of left shoulder 03/17/2016  . Impingement syndrome of left shoulder 03/17/2016  . Right hip pain 12/19/2015  . Primary osteoarthritis of right knee 12/05/2015  . Lichen planus 27/06/2374  . Chest  pain 01/13/2015  . Left forearm pain 06/08/2014  . Acute bilateral low back pain with bilateral sciatica 05/16/2014  . Breast pain, left 05/16/2014  . Cervical radiculopathy 05/16/2014  . Abnormal weight gain 02/22/2014  . Cervical pain (neck) 02/22/2014  . Fatigue 02/22/2014  . Hair loss 02/22/2014  . Muscle spasm 02/22/2014  . Mild intermittent asthma 11/12/2013    Past Surgical History:  Procedure Laterality Date  . ABDOMINAL HYSTERECTOMY  2002  . ANTERIOR FUSION CERVICAL SPINE  2009  . COLONOSCOPY  2013   normal  . ELECTROMAGNETIC NAVIGATION BROCHOSCOPY Left 12/12/2017   Procedure: ELECTROMAGNETIC NAVIGATION BRONCHOSCOPY;  Surgeon: Flora Lipps, MD;  Location: [facility] ORS;  Service: Cardiopulmonary;  Laterality: Left;  . ESOPHAGOGASTRODUODENOSCOPY  1990   GI bleed  . FLEXIBLE BRONCHOSCOPY Left 12/12/2017   Procedure: FLEXIBLE BRONCHOSCOPY;  Surgeon: Flora Lipps, MD;  Location: [facility] ORS;  Service: Cardiopulmonary;  Laterality: Left;  . KNEE ARTHROSCOPY Right 2010   x3  . KNEE SURGERY Left    Arthroscopy x1  . LAPAROSCOPIC SUPRACERVICAL HYSTERECTOMY  2002   with BSO for fibroids/ AUB/ pelvic pain  . LAPAROSCOPIC TUBAL LIGATION  1982  . SHOULDER SURGERY Right    rotator cuff repair  . SHOULDER SURGERY Right 2011   second rotator cuff repair  . tubal reanastamosis  1985    Prior to Admission medications   Medication Sig Start Date End  Date Taking? Authorizing Provider  Biotin 10000 MCG TABS Take 1 tablet by mouth daily.    [provider]  carvedilol (COREG) 6.25 MG tablet Take 6.25 mg by mouth 2 (two) times daily. 02/01/18   [provider]  estradiol (ESTRACE) 2 MG tablet Take 1 tablet (2 mg total) by mouth daily. 02/17/17   Dalia Heading, CNM  Multiple Vitamin (MULTI VITAMIN DAILY PO) Take by mouth daily.    [provider]  VENTOLIN HFA 108 (90 Base) MCG/ACT inhaler Inhale 2 puffs into the lungs every 6 (six) hours as needed.  11/14/17    [provider]    Allergies  Allergen Reactions  . Shellfish Allergy Anaphylaxis    Other reaction(s): anaphylaxis/angioedema Other reaction(s): anaphylaxis/angioedema  Betadine on skin is okay  . Tramadol Other (See Comments)    "blacked out", found wandering in the street  . Celecoxib Other (See Comments)    GI bleed Other reaction(s): gi distress, Other (comments), Other (See Comments) GI BLEED      GI bleeding  GI BLEED  GI Bleeding   GI bleed Other reaction(s): gi distress, Other (comments), Other (See Comments) GI BLEED  . Gabapentin Other (See Comments)    Drowsiness. Like a zombie, not functional   . Hydrocodone Other (See Comments)    Hair falls out.  Marland Kitchen Hydrocodone-Acetaminophen     Other reaction(s): other/intolerance HAIR FALLS OUT   . Ibuprofen Other (See Comments)    "Blood disorder", GI bleed. Other reaction(s): gi distress, Other (comments), Other (See Comments)   . Naproxen Other (See Comments)    GI bleed Other reaction(s): Other (comments), Other (See Comments)        . Prednisone Other (See Comments)    Tremors, severe  . Singulair [Montelukast] Other (See Comments)    Syncope..passed out  . Strawberry Extract Hives, Nausea And Vomiting and Nausea Only    Severe vomitting  . Codeine Itching and Nausea Only    Other reaction(s): mild rash/itching  . Doxycycline Other (See Comments)    Unknown reaction. Other reaction(s): Other (See Comments), Unknown, Unknown (comments) Pt does not remember   . Eggs Or Egg-Derived Products Rash and Other (See Comments)    rash  . Latex Rash and Itching    Other reaction(s): mild rash/itching  . Nsaids Nausea And Vomiting and Other (See Comments)    Gastric bleed        . Strawberry Flavor Hives  . Sulfamethoxazole-Trimethoprim Other (See Comments)    Other reaction(s): Other (See Comments), other/intolerance MAKES PAIN WORSE  Increases pain Increases pain     Family  History  Problem Relation Age of Onset  . Diabetes Mother   . Dementia Mother        Alzheimers  . Hypertension Mother   . Glaucoma Mother   . Prostate cancer Father 55  . Stroke Father   . Arthritis Father   . Osteoporosis Father   . Prostate cancer Brother   . Breast cancer Neg Hx     Social History Social History   Tobacco Use  . Smoking status: Former Smoker    Packs/day: 1.00    Years: 13.00    Pack years: 13.00  . Smokeless tobacco: Never Used  Substance Use Topics  . Alcohol use: Not Currently  . Drug use: No    Review of Systems Constitutional: Positive for lightheadedness. Cardiovascular: Negative for chest pain. Respiratory: Negative for shortness of breath. Gastrointestinal: Negative for abdominal pain, vomiting Genitourinary:  Negative for urinary compaints Musculoskeletal: Negative for musculoskeletal complaints Skin: Positive for diaphoresis, now resolved. Neurological: Negative for headache All other ROS negative  ____________________________________________   PHYSICAL EXAM:  VITAL SIGNS: ED Triage Vitals [03/19/18 0809]  Enc Vitals Group     BP (!) 155/96     Pulse Rate 99     Resp 18     Temp 97.7 F (36.5 C)     Temp Source Oral     SpO2 99 %     Weight 156 lb 13.7 oz (71.2 kg)     Height _0  (1.549 m)     Head Circumference      Peak Flow      Pain Score 0     Pain Loc      Pain Edu?      Excl. in [place]?    Constitutional: Alert and oriented. Well appearing and in no distress. Eyes: Normal exam ENT   Head: Normocephalic and atraumatic.   Mouth/Throat: Mucous membranes are moist. Cardiovascular: Normal rate, regular rhythm. No murmur Respiratory: Normal respiratory effort without tachypnea nor retractions. Breath sounds are clear  Gastrointestinal: Soft and nontender. No distention.   Musculoskeletal: Nontender with normal range of motion in all extremities.  Neurologic:  Normal speech and language. No gross focal  neurologic deficits Skin:  Skin is warm, dry and intact.  Psychiatric: Mood and affect are normal.   ____________________________________________    EKG  EKG viewed and interpreted by myself shows a normal sinus rhythm 89 bpm with a narrow QRS, normal axis, normal intervals, no concerning ST changes.  ____________________________________________   INITIAL IMPRESSION / ASSESSMENT AND PLAN / ED COURSE  Pertinent labs & imaging results that were available during my care of the patient were reviewed by me and considered in my medical decision making (see chart for details).  Patient presents to the emergency department for near syncope.  Patient states it started with a sensation of nausea and then she became lightheaded and diaphoretic.  Patient states it felt like she was going to vomit but did not.  Differential at this time would include vasovagal episode, near syncope, ACS, arrhythmia, metabolic abnormality, dehydration/hypovolemia.  We will check labs, IV hydrate and continue to closely monitor.  Patient's labs are reassuring.  We will repeat a troponin and continue to closely monitor.  Blood pressure is normal 129/71.  Repeat troponin is negative.  Patient is feeling much better.  Highly suspect vasovagal event this morning.  We will discharge with PCP follow-up.  ____________________________________________   FINAL CLINICAL IMPRESSION(S) / ED DIAGNOSES  Near syncope   Harvest Dark, MD 03/19/18 1332

## 2018-03-21 ENCOUNTER — Telehealth: Payer: Self-pay

## 2018-03-21 NOTE — Telephone Encounter (Signed)
Pt calling for refill of estradiol 2mg  to be sent to [facility] [address].  [phone]

## 2018-03-22 ENCOUNTER — Other Ambulatory Visit: Payer: Self-pay | Admitting: Certified Nurse Midwife

## 2018-03-24 ENCOUNTER — Other Ambulatory Visit: Payer: Self-pay | Admitting: Certified Nurse Midwife

## 2018-03-27 NOTE — Telephone Encounter (Signed)
Called and left voicemail to call back to reschedule

## 2018-03-29 DIAGNOSIS — Z483 Aftercare following surgery for neoplasm: Secondary | ICD-10-CM | POA: Insufficient documentation

## 2018-03-30 DIAGNOSIS — Z9889 Other specified postprocedural states: Secondary | ICD-10-CM

## 2018-03-30 HISTORY — DX: Other specified postprocedural states: Z98.890

## 2018-03-31 ENCOUNTER — Encounter: Payer: Self-pay | Admitting: Emergency Medicine

## 2018-03-31 ENCOUNTER — Emergency Department
Admission: EM | Admit: 2018-03-31 | Discharge: 2018-03-31 | Disposition: A | Discharge status: Home / Self Care | Payer: Medicare Other | Attending: Emergency Medicine | Admitting: Emergency Medicine

## 2018-03-31 ENCOUNTER — Other Ambulatory Visit: Payer: Self-pay

## 2018-03-31 DIAGNOSIS — Z79899 Other long term (current) drug therapy: Secondary | ICD-10-CM | POA: Diagnosis not present

## 2018-03-31 DIAGNOSIS — I10 Essential (primary) hypertension: Secondary | ICD-10-CM | POA: Diagnosis not present

## 2018-03-31 DIAGNOSIS — J45909 Unspecified asthma, uncomplicated: Secondary | ICD-10-CM | POA: Diagnosis not present

## 2018-03-31 DIAGNOSIS — Z87891 Personal history of nicotine dependence: Secondary | ICD-10-CM | POA: Insufficient documentation

## 2018-03-31 DIAGNOSIS — R42 Dizziness and giddiness: Secondary | ICD-10-CM | POA: Diagnosis present

## 2018-03-31 DIAGNOSIS — Z91040 Latex allergy status: Secondary | ICD-10-CM | POA: Insufficient documentation

## 2018-03-31 LAB — BASIC METABOLIC PANEL
Anion gap: 6 (ref 5–15)
BUN: 6 mg/dL (ref 6–20)
CALCIUM: 9 mg/dL (ref 8.9–10.3)
CO2: 29 mmol/L (ref 22–32)
Chloride: 101 mmol/L (ref 98–111)
Creatinine, Ser: 0.72 mg/dL (ref 0.44–1.00)
GFR calc Af Amer: >60 mL/min (ref >60)
GFR calc non Af Amer: >60 mL/min (ref >60)
Glucose, Bld: 103 mg/dL — ABNORMAL HIGH (ref 70–99)
Potassium: 4.7 mmol/L (ref 3.5–5.1)
Sodium: 136 mmol/L (ref 135–145)

## 2018-03-31 LAB — CBC
HCT: 33.7 % — ABNORMAL LOW (ref 36.0–46.0)
Hemoglobin: 10.9 g/dL — ABNORMAL LOW (ref 12.0–15.0)
MCH: 30.2 pg (ref 26.0–34.0)
MCHC: 32.3 g/dL (ref 30.0–36.0)
MCV: 93.4 fL (ref 80.0–100.0)
Platelets: 278 10*3/uL (ref 150–400)
RBC: 3.61 MIL/uL — ABNORMAL LOW (ref 3.87–5.11)
RDW: 11.9 % (ref 11.5–15.5)
WBC: 11.7 10*3/uL — ABNORMAL HIGH (ref 4.0–10.5)
nRBC: 0 % (ref 0.0–0.2)

## 2018-03-31 LAB — TROPONIN I: Troponin I: <0.03 ng/mL (ref <0.03)

## 2018-03-31 MED ORDER — ONDANSETRON HCL 4 MG/2ML IJ SOLN
4.00 | INTRAMUSCULAR | Status: DC
Start: ? — End: 2018-03-31

## 2018-03-31 MED ORDER — ACETAMINOPHEN 325 MG PO TABS
650.00 | ORAL_TABLET | ORAL | Status: DC
Start: 2018-03-30 — End: 2018-03-31

## 2018-03-31 MED ORDER — MELATONIN 3 MG PO TABS
3.00 | ORAL_TABLET | ORAL | Status: DC
Start: 2018-03-30 — End: 2018-03-31

## 2018-03-31 MED ORDER — CARVEDILOL 3.125 MG PO TABS
3.13 | ORAL_TABLET | ORAL | Status: DC
Start: 2018-03-30 — End: 2018-03-31

## 2018-03-31 MED ORDER — BISACODYL 10 MG RE SUPP
10.00 | RECTAL | Status: DC
Start: ? — End: 2018-03-31

## 2018-03-31 MED ORDER — GENERIC EXTERNAL MEDICATION
Status: DC
Start: ? — End: 2018-03-31

## 2018-03-31 MED ORDER — POLYETHYLENE GLYCOL 3350 17 G PO PACK
17.00 | PACK | ORAL | Status: DC
Start: ? — End: 2018-03-31

## 2018-03-31 MED ORDER — SIMETHICONE 80 MG PO CHEW
80.00 | CHEWABLE_TABLET | ORAL | Status: DC
Start: ? — End: 2018-03-31

## 2018-03-31 MED ORDER — SENNOSIDES-DOCUSATE SODIUM 8.6-50 MG PO TABS
2.00 | ORAL_TABLET | ORAL | Status: DC
Start: 2018-03-30 — End: 2018-03-31

## 2018-03-31 MED ORDER — HEPARIN SODIUM (PORCINE) 5000 UNIT/ML IJ SOLN
5000.00 | INTRAMUSCULAR | Status: DC
Start: 2018-03-30 — End: 2018-03-31

## 2018-03-31 MED ORDER — GLUCAGON HCL RDNA (DIAGNOSTIC) 1 MG IJ SOLR
1.00 | INTRAMUSCULAR | Status: DC
Start: ? — End: 2018-03-31

## 2018-03-31 MED ORDER — DEXTROSE 50 % IV SOLN
12.50 | INTRAVENOUS | Status: DC
Start: ? — End: 2018-03-31

## 2018-03-31 MED ORDER — OXYCODONE HCL 5 MG PO TABS
5.00 | ORAL_TABLET | ORAL | Status: DC
Start: ? — End: 2018-03-31

## 2018-03-31 MED ORDER — MAGNESIUM HYDROXIDE 400 MG/5ML PO SUSP
30.00 | ORAL | Status: DC
Start: ? — End: 2018-03-31

## 2018-03-31 NOTE — ED Triage Notes (Signed)
Pt presents to ED from home, states she had an episode today of feeling dizzy and hot/sweaty. Pt states she was discharged yesterday after L lower lung wedge resection for unknown nodule. Pt states she checked her vitals at home and had HR in 120s and BP 161/94. Pt took carvedilol and amlodipine after taking VS (missed morning doses).

## 2018-03-31 NOTE — ED Notes (Signed)
Pt stating she did not take her normal BP medications this morning and noticed her HR was in the 120s. Pt stating she took her medication about a hour ago and is feeling better but she was just concerned.

## 2018-03-31 NOTE — ED Provider Notes (Signed)
[facility] Emergency Department Provider Note  Time seen: 4:43 PM  I have reviewed the triage vital signs and the nursing notes.   HISTORY  Chief Complaint Dizziness   HPI Carmen Grimes is a 58 y.o. female with a past medical history of gastric reflux, hypertension, recent lung resection for lung mass 03/28/2018, presents to the emergency department for dizziness and high blood pressure.  According to the patient while she was in the hospital at [facility] she was not getting her blood pressure medication.  Patient states today she felt dizzy so she took her blood pressure and it was 182 systolic over the 99B on the diastolic.  Patient became very concerned and came to the emergency department for evaluation.  Prior to coming to the emergency department the patient took her carvedilol and amlodipine, which she had not been taking, because she states while in the hospital her blood pressure was controlled without medication and they told her to discontinue these medications.  Patient denies any headache, no weakness or numbness or chest pain over baseline.  Patient is having mild left-sided chest discomfort where they did the resection, but states it has been well controlled with oxycodone and Tylenol at home.  During my evaluation the patient continues to states she is fine and does not think she needs any work-up now that her blood pressures come down.   Past Medical History:  Diagnosis Date  . Asthma    controlled  . Dyspnea 12/2017   difficulty getting her breath at all times  . GERD (gastroesophageal reflux disease)   . H/O: GI bleed   . Hypertension   . Mitral valve prolapse   . Osteoarthritis    knees    Patient Active Problem List   Diagnosis Date Noted  . H/O chest tube placement   . Pneumothorax 12/12/2017  . Lung nodule   . Degeneration of lumbar intervertebral disc 12/05/2017  . Cervical spondylosis without myelopathy 06/26/2017  . Diffuse  cervicobrachial syndrome 06/26/2017  . Synovitis of right hand 02/01/2017  . H/O: GI bleed   . Osteoarthritis   . GERD (gastroesophageal reflux disease)   . Asthma   . Vitamin D deficiency 07/16/2016  . Anxiety 07/12/2016  . Acute pain of left shoulder 03/17/2016  . Impingement syndrome of left shoulder 03/17/2016  . Right hip pain 12/19/2015  . Primary osteoarthritis of right knee 12/05/2015  . Lichen planus 71/69/6789  . Chest pain 01/13/2015  . Left forearm pain 06/08/2014  . Acute bilateral low back pain with bilateral sciatica 05/16/2014  . Breast pain, left 05/16/2014  . Cervical radiculopathy 05/16/2014  . Abnormal weight gain 02/22/2014  . Cervical pain (neck) 02/22/2014  . Fatigue 02/22/2014  . Hair loss 02/22/2014  . Muscle spasm 02/22/2014  . Mild intermittent asthma 11/12/2013    Past Surgical History:  Procedure Laterality Date  . ABDOMINAL HYSTERECTOMY  2002  . ANTERIOR FUSION CERVICAL SPINE  2009  . COLONOSCOPY  2013   normal  . ELECTROMAGNETIC NAVIGATION BROCHOSCOPY Left 12/12/2017   Procedure: ELECTROMAGNETIC NAVIGATION BRONCHOSCOPY;  Surgeon: Flora Lipps, MD;  Location: [facility] ORS;  Service: Cardiopulmonary;  Laterality: Left;  . ESOPHAGOGASTRODUODENOSCOPY  1990   GI bleed  . FLEXIBLE BRONCHOSCOPY Left 12/12/2017   Procedure: FLEXIBLE BRONCHOSCOPY;  Surgeon: Flora Lipps, MD;  Location: [facility] ORS;  Service: Cardiopulmonary;  Laterality: Left;  . KNEE ARTHROSCOPY Right 2010   x3  . KNEE SURGERY Left    Arthroscopy x1  .  LAPAROSCOPIC SUPRACERVICAL HYSTERECTOMY  2002   with BSO for fibroids/ AUB/ pelvic pain  . LAPAROSCOPIC TUBAL LIGATION  1982  . SHOULDER SURGERY Right    rotator cuff repair  . SHOULDER SURGERY Right 2011   second rotator cuff repair  . tubal reanastamosis  1985    Prior to Admission medications   Medication Sig Start Date End Date Taking? Authorizing Provider  amLODipine (NORVASC) 5 MG tablet Take 5 mg by mouth daily.    [provider]  Biotin 10000 MCG TABS Take 1 tablet by mouth daily.    [provider]  carvedilol (COREG) 6.25 MG tablet Take 6.25 mg by mouth 2 (two) times daily. 02/01/18   [provider]  chlorpheniramine-HYDROcodone (TUSSIONEX PENNKINETIC ER) 10-8 MG/5ML SUER Take 5 mLs by mouth every 12 (twelve) hours as needed for cough.    [provider]  estradiol (ESTRACE) 2 MG tablet Take 1 tablet (2 mg total) by mouth daily. 02/17/17   Dalia Heading, CNM  Multiple Vitamin (MULTI VITAMIN DAILY PO) Take 1 tablet by mouth daily.     [provider]  VENTOLIN HFA 108 (90 Base) MCG/ACT inhaler Inhale 2 puffs into the lungs every 6 (six) hours as needed for wheezing or shortness of breath.  11/14/17   [provider]    Allergies  Allergen Reactions  . Shellfish Allergy Anaphylaxis    Other reaction(s): anaphylaxis/angioedema Other reaction(s): anaphylaxis/angioedema  Betadine on skin is okay  . Tramadol Other (See Comments)    "blacked out", found wandering in the street  . Celecoxib Other (See Comments)    GI bleed Other reaction(s): gi distress, Other (comments), Other (See Comments) GI BLEED      GI bleeding  GI BLEED  GI Bleeding   GI bleed Other reaction(s): gi distress, Other (comments), Other (See Comments) GI BLEED  . Gabapentin Other (See Comments)    Drowsiness. Like a zombie, not functional   . Hydrocodone Other (See Comments)    Hair falls out.  Marland Kitchen Hydrocodone-Acetaminophen     Other reaction(s): other/intolerance HAIR FALLS OUT   . Ibuprofen Other (See Comments)    "Blood disorder", GI bleed. Other reaction(s): gi distress, Other (comments), Other (See Comments)   . Naproxen Other (See Comments)    GI bleed Other reaction(s): Other (comments), Other (See Comments)        . Prednisone Other (See Comments)    Tremors, severe  . Singulair [Montelukast] Other (See Comments)    Syncope..passed out  .  Strawberry Extract Hives, Nausea And Vomiting and Nausea Only    Severe vomitting  . Codeine Itching and Nausea Only    Other reaction(s): mild rash/itching  . Doxycycline Other (See Comments)    Unknown reaction. Other reaction(s): Other (See Comments), Unknown, Unknown (comments) Pt does not remember   . Eggs Or Egg-Derived Products Rash and Other (See Comments)    rash  . Latex Rash and Itching    Other reaction(s): mild rash/itching  . Nsaids Nausea And Vomiting and Other (See Comments)    Gastric bleed        . Strawberry Flavor Hives  . Sulfamethoxazole-Trimethoprim Other (See Comments)    Other reaction(s): Other (See Comments), other/intolerance MAKES PAIN WORSE  Increases pain Increases pain     Family History  Problem Relation Age of Onset  . Diabetes Mother   . Dementia Mother        Alzheimers  . Hypertension Mother   . Glaucoma  Mother   . Prostate cancer Father 8  . Stroke Father   . Arthritis Father   . Osteoporosis Father   . Prostate cancer Brother   . Breast cancer Neg Hx     Social History Social History   Tobacco Use  . Smoking status: Former Smoker    Packs/day: 1.00    Years: 13.00    Pack years: 13.00  . Smokeless tobacco: Never Used  Substance Use Topics  . Alcohol use: Not Currently  . Drug use: No    Review of Systems Constitutional: Negative for fever. Cardiovascular: Left-sided chest pain since her surgery, improving per patient. Respiratory: Negative for shortness of breath. Gastrointestinal: Negative for abdominal pain, vomiting  Musculoskeletal: Negative for musculoskeletal complaints Skin: Negative for skin complaints  Neurological: Negative for headache All other ROS negative  ____________________________________________   PHYSICAL EXAM:  VITAL SIGNS: ED Triage Vitals  Enc Vitals Group     BP 03/31/18 1548 (!) 148/86     Pulse Rate 03/31/18 1548 (!) 105     Resp 03/31/18 1548 18     Temp 03/31/18 1548  98.4 F (36.9 C)     Temp src --      SpO2 03/31/18 1548 95 %     Weight 03/31/18 1554 156 lb 8.4 oz (71 kg)     Height 03/31/18 1554 _0  (1.549 m)     Head Circumference --      Peak Flow --      Pain Score 03/31/18 1553 0     Pain Loc --      Pain Edu? --      Excl. in [place]? --    Constitutional: Alert and oriented. Well appearing and in no distress. Eyes: Normal exam ENT   Head: Normocephalic and atraumatic.   Mouth/Throat: Mucous membranes are moist. Cardiovascular: Normal rate, regular rhythm.  Respiratory: Normal respiratory effort without tachypnea nor retractions. Breath sounds are clear.  Moderate left chest wall tenderness around incision/chest tube site, well-healing site. Gastrointestinal: Soft and nontender. No distention.   Musculoskeletal: Nontender with normal range of motion in all extremities.  Neurologic:  Normal speech and language. No gross focal neurologic deficits Skin:  Skin is warm, dry and intact.  Psychiatric: Mood and affect are normal.   ____________________________________________    EKG  EKG viewed and interpreted by myself shows a normal sinus rhythm at 95 bpm with a narrow QRS, mild left axis deviation, largely normal intervals, no concerning ST changes.  ____________________________________________   INITIAL IMPRESSION / ASSESSMENT AND PLAN / ED COURSE  Pertinent labs & imaging results that were available during my care of the patient were reviewed by me and considered in my medical decision making (see chart for details).  Patient presents to the emergency department for dizziness and high blood pressure.  Differential would include hypertension now resolved, dizziness, ACS, metabolic abnormality.  Patient appears very well currently, she is agreeable to basic labs as a precaution.  We will check labs to continue to closely monitor.  EKG is reassuring.  Blood pressure currently 131/78.  Patient's lab work is reassuring.  Patient  continues to appear well.  We will discharge patient with PCP follow-up.  Patient agreeable to plan of care.  ____________________________________________   FINAL CLINICAL IMPRESSION(S) / ED DIAGNOSES  Hypertension Dizziness   Harvest Dark, MD 03/31/18 1710

## 2018-04-02 ENCOUNTER — Emergency Department
Admission: EM | Admit: 2018-04-02 | Discharge: 2018-04-02 | Disposition: A | Discharge status: Home / Self Care | Payer: Medicare Other | Attending: Emergency Medicine | Admitting: Emergency Medicine

## 2018-04-02 ENCOUNTER — Other Ambulatory Visit: Payer: Self-pay

## 2018-04-02 ENCOUNTER — Emergency Department: Payer: Medicare Other

## 2018-04-02 DIAGNOSIS — J45909 Unspecified asthma, uncomplicated: Secondary | ICD-10-CM | POA: Insufficient documentation

## 2018-04-02 DIAGNOSIS — I10 Essential (primary) hypertension: Secondary | ICD-10-CM | POA: Insufficient documentation

## 2018-04-02 DIAGNOSIS — Z79899 Other long term (current) drug therapy: Secondary | ICD-10-CM | POA: Diagnosis not present

## 2018-04-02 DIAGNOSIS — R002 Palpitations: Secondary | ICD-10-CM | POA: Diagnosis present

## 2018-04-02 DIAGNOSIS — Z87891 Personal history of nicotine dependence: Secondary | ICD-10-CM | POA: Insufficient documentation

## 2018-04-02 DIAGNOSIS — R000 Tachycardia, unspecified: Secondary | ICD-10-CM | POA: Insufficient documentation

## 2018-04-02 NOTE — ED Provider Notes (Signed)
[facility] Emergency Department Provider Note   ____________________________________________    I have reviewed the triage vital signs and the nursing notes.   HISTORY  Chief Complaint Tachycardia     HPI Carmen Grimes is a 58 y.o. female with history as detailed below who presents with complaints of brief episode of palpitations at home.  Patient reports now her symptoms have resolved.  Recent left lung lobectomy, has been to the emergency department twice recently as well.  She denies chest pain.  She reports her breathing is gradually improving.  She denies fevers or chills.  No new pleurisy leg swelling.  Reports compliance with her medications  Past Medical History:  Diagnosis Date  . Asthma    controlled  . Dyspnea 12/2017   difficulty getting her breath at all times  . GERD (gastroesophageal reflux disease)   . H/O: GI bleed   . Hypertension   . Mitral valve prolapse   . Osteoarthritis    knees    Patient Active Problem List   Diagnosis Date Noted  . H/O chest tube placement   . Pneumothorax 12/12/2017  . Lung nodule   . Degeneration of lumbar intervertebral disc 12/05/2017  . Cervical spondylosis without myelopathy 06/26/2017  . 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 Synovitis of right hand 02/01/2017  . H/O: GI bleed   . Osteoarthritis   . GERD (gastroesophageal reflux disease)   . Asthma   . Vitamin D deficiency 07/16/2016  . Anxiety 07/12/2016  . Acute pain of left shoulder 03/17/2016  . Impingement syndrome of left shoulder 03/17/2016  . Right hip pain 12/19/2015  . Primary osteoarthritis of right knee 12/05/2015  . Lichen planus 01/12/3233  . Chest pain 01/13/2015  . Left forearm pain 06/08/2014  . Acute bilateral low back pain with bilateral sciatica 05/16/2014  . Breast pain, left 05/16/2014  . Cervical radiculopathy 05/16/2014  . Abnormal weight gain 02/22/2014  . Cervical pain (neck) 02/22/2014  .  Fatigue 02/22/2014  . Hair loss 02/22/2014  . Muscle spasm 02/22/2014  . Mild intermittent asthma 11/12/2013    Past Surgical History:  Procedure Laterality Date  . ABDOMINAL HYSTERECTOMY  2002  . ANTERIOR FUSION CERVICAL SPINE  2009  . COLONOSCOPY  2013   normal  . ELECTROMAGNETIC NAVIGATION BROCHOSCOPY Left 12/12/2017   Procedure: ELECTROMAGNETIC NAVIGATION BRONCHOSCOPY;  Surgeon: Flora Lipps, MD;  Location: [facility] ORS;  Service: Cardiopulmonary;  Laterality: Left;  . ESOPHAGOGASTRODUODENOSCOPY  1990   GI bleed  . FLEXIBLE BRONCHOSCOPY Left 12/12/2017   Procedure: FLEXIBLE BRONCHOSCOPY;  Surgeon: Flora Lipps, MD;  Location: [facility] ORS;  Service: Cardiopulmonary;  Laterality: Left;  . KNEE ARTHROSCOPY Right 2010   x3  . KNEE SURGERY Left    Arthroscopy x1  . LAPAROSCOPIC SUPRACERVICAL HYSTERECTOMY  2002   with BSO for fibroids/ AUB/ pelvic pain  . LAPAROSCOPIC TUBAL LIGATION  1982  . SHOULDER SURGERY Right    rotator cuff repair  . SHOULDER SURGERY Right 2011   second rotator cuff repair  . tubal reanastamosis  1985    Prior to Admission medications   Medication Sig Start Date End Date Taking? Authorizing Provider  amLODipine (NORVASC) 5 MG tablet Take 5 mg by mouth daily.    [provider]  Biotin 10000 MCG TABS Take 1 tablet by mouth daily.    [provider]  carvedilol (COREG) 6.25 MG tablet Take 6.25 mg by mouth 2 (two) times daily. 02/01/18   [provider]  chlorpheniramine-HYDROcodone (TUSSIONEX PENNKINETIC ER) 10-8 MG/5ML SUER Take 5 mLs by mouth every 12 (twelve) hours as needed for cough.    [provider]  estradiol (ESTRACE) 2 MG tablet Take 1 tablet (2 mg total) by mouth daily. 02/17/17   Dalia Heading, CNM  Multiple Vitamin (MULTI VITAMIN DAILY PO) Take 1 tablet by mouth daily.     [provider]  VENTOLIN HFA 108 (90 Base) MCG/ACT inhaler Inhale 2 puffs into the lungs every 6 (six) hours as needed for wheezing or  shortness of breath.  11/14/17   [provider]     Allergies Shellfish allergy; Tramadol; Celecoxib; Gabapentin; Hydrocodone; Hydrocodone-acetaminophen; Ibuprofen; Naproxen; Prednisone; Singulair [montelukast]; Strawberry extract; Codeine; Doxycycline; Eggs or egg-derived products; Latex; Nsaids; Strawberry flavor; and Sulfamethoxazole-trimethoprim  Family History  Problem Relation Age of Onset  . Diabetes Mother   . Dementia Mother        Alzheimers  . Hypertension Mother   . Glaucoma Mother   . Prostate cancer Father 62  . Stroke Father   . Arthritis Father   . Osteoporosis Father   . Prostate cancer Brother   . Breast cancer Neg Hx     Social History Social History   Tobacco Use  . Smoking status: Former Smoker    Packs/day: 1.00    Years: 13.00    Pack years: 13.00  . Smokeless tobacco: Never Used  Substance Use Topics  . Alcohol use: Not Currently  . Drug use: No    Review of Systems  Constitutional: No fever/chills Eyes: No visual changes.  ENT: No sore throat. Cardiovascular: As above Respiratory: As above Gastrointestinal: No abdominal pain.   Genitourinary: Negative for dysuria. Musculoskeletal: Negative for back pain. Skin: Negative for rash. Neurological: Negative for headaches   ____________________________________________   PHYSICAL EXAM:  VITAL SIGNS: ED Triage Vitals  Enc Vitals Group     BP 04/02/18 1600 133/83     Pulse Rate 04/02/18 1558 (!) 106     Resp 04/02/18 1558 19     Temp 04/02/18 1558 99.2 F (37.3 C)     Temp Source 04/02/18 1558 Oral     SpO2 04/02/18 1558 97 %     Weight 04/02/18 1559 69.9 kg (154 lb)     Height --      Head Circumference --      Peak Flow --      Pain Score 04/02/18 1559 0     Pain Loc --      Pain Edu? --      Excl. in [place]? --     Constitutional: Alert and oriented. No acute distress. Eyes: Conjunctivae are normal.   Nose: No congestion/rhinnorhea. Mouth/Throat: Mucous membranes  are moist.    Cardiovascular: Minimal tachycardia, regular rhythm. Grossly normal heart sounds.  Good peripheral circulation.  Well-healing surgical incision Respiratory: Normal respiratory effort.  No retractions. Lungs CTAB. Gastrointestinal: Soft and nontender. No distention.   Musculoskeletal: No lower extremity tenderness nor edema.  Warm and well perfused Neurologic:  Normal speech and language. No gross focal neurologic deficits are appreciated.  Skin:  Skin is warm, dry and intact. No rash noted. Psychiatric: Somewhat anxious. Speech and behavior are normal.  ____________________________________________   LABS (all labs ordered are listed, but only abnormal results are displayed)  Labs Reviewed - No data to display ____________________________________________  EKG  ED ECG REPORT I, Lavonia Drafts, the attending physician, personally viewed and interpreted this ECG.  Date: 04/02/2018  Rhythm: Sinus tachycardia QRS Axis: normal Intervals: normal ST/T Wave abnormalities: normal Narrative Interpretation: no evidence of acute ischemia  ____________________________________________  RADIOLOGY  Chest x-ray overall unremarkable ____________________________________________   PROCEDURES  Procedure(s) performed: No  Procedures   Critical Care performed: No ____________________________________________   INITIAL IMPRESSION / ASSESSMENT AND PLAN / ED COURSE  Pertinent labs & imaging results that were available during my care of the patient were reviewed by me and considered in my medical decision making (see chart for details).  Patient overall well-appearing and in no acute distress, mild tachycardia noted, suspect this may be related to recent lobectomy.  No concern for infection afebrile blood pressure normal.  Had labs yesterday which were unremarkable, will add chest x-ray  Discussed chest x-ray results with patient, her heart rate is normalized, she feels quite  well and is reassured.  Appropriate for follow-up with her surgeon    ____________________________________________   FINAL CLINICAL IMPRESSION(S) / ED DIAGNOSES  Final diagnoses:  Tachycardia        Note:  This document was prepared using Dragon voice recognition software and may include unintentional dictation errors.   Lavonia Drafts, MD 04/02/18 [phone]

## 2018-04-02 NOTE — ED Triage Notes (Signed)
Pt presents via POV c/o SOB and tachycardia. Reports felt hear racing at rest. Seen here yesterday for the same symptoms. Reports lobectomy this week. Using MDI inhaler for SOB.

## 2018-04-02 NOTE — ED Notes (Signed)
Answered pts call bell. Pt requesting to use the restroom. Assisted pt to toilet.

## 2018-04-03 ENCOUNTER — Other Ambulatory Visit: Payer: Self-pay

## 2018-04-03 ENCOUNTER — Telehealth: Payer: Self-pay | Admitting: Physician Assistant

## 2018-04-03 MED ORDER — ESTRADIOL 2 MG PO TABS: 2.0000 mg | ORAL_TABLET | Freq: Every day | ORAL | 3 refills | Status: DC

## 2018-04-03 NOTE — Telephone Encounter (Signed)
Returned the pt call. Pt sts that shhas a left lung lumpectomy performed at [facility] last week. Over the weekend she developed palpitations and tachycardia. She was seen the ED for evaluations. Labs were normal. EKG demonstrated sinus tach HR 102bpm. She was d/c and instructed to f/u with her thoracic surgeon and cardiologist. She contacted [facility] and was told that her heart was working harder to produce oxygen sine she only has one lung. They adv her to contact her cardiologist, to adjust her Coreg dosage if needed.   Pt sts that she is doing ok this morning. She has taken her morning Coreg and Amlodipine around 7am. I asked her to check her BP and HR while I held on the line. Pt reports 123/77 78bpm. Adv the pt that her heart is working to compensate. Adv her to avoid the ED if possible because of COVID-19, she is at higher risk for infection due to recent sx, and high risk due to lung dx. Adv the pt that I will fwd an update to [employer], [place] and call back his recommendation. She is to call back in the interim if symptoms reoccur. Pt agreeable with the plan and verbalized understanding.

## 2018-04-03 NOTE — Telephone Encounter (Signed)
Pt was at ED this past weekend, due to tachycardia, and thinks her medicine needs to be changed. Please call to advise.

## 2018-04-04 MED ORDER — CARVEDILOL 12.5 MG PO TABS: 12.5000 mg | ORAL_TABLET | Freq: Two times a day (BID) | ORAL | 3 refills | Status: DC

## 2018-04-04 NOTE — Telephone Encounter (Signed)
Reviewed ED note ans studies. We can increase her Coreg to 12.5 mg bid. If she continues to note tachy-palpitations, we can change her to metoprolol for added AV nodal effect. ED vitals showed a pulse ox of 97% with a heart rate of 102 bpm on EKG, making PE less likely, though if there is concern for this, it would need to be evaluated. If symptoms persist, could also consider CBC to ensure stable HGB as this was not checked at her ED visit on 3/29.

## 2018-04-04 NOTE — Telephone Encounter (Signed)
Called patient. Reviewed Ryan's recommendations and assessment. She verbalized understanding to increase carvedilol to 12.5 mg two times a day. Rx sent to pharmacy. She will take 2 - 6.25 mg tablets two times a day until she runs out and then pick up the new prescription. She was concerned about her BP dropping too low with the increase of the medication. Advised her to check BP about 2 hours after taking the carvedilol in the mornings to see the BP. If she sees anything concerning to give [place] a call. She was appreciative.

## 2018-04-05 ENCOUNTER — Telehealth: Payer: Self-pay | Admitting: Physician Assistant

## 2018-04-05 NOTE — Telephone Encounter (Signed)
Pt c/o BP issue: STAT if pt c/o blurred vision, one-sided weakness or slurred speech  1. What are your last 5 BP readings?  This morning 115/72 HR 82  2. Are you having any other symptoms (ex. Dizziness, headache, blurred vision, passed out)? No symptoms  3. What is your BP issue? Pt asks if she should take her BP medication today? Please call and advise.

## 2018-04-05 NOTE — Telephone Encounter (Signed)
Returned call to patient. She reported that at 7:30 AM this morning she had BP reading of 115/73, HR 72 and is questioning if she should take her am meds including 12.5 mg Coreg and 5 mg Amlodipine.   She rechecked BP when I was on the phone with her and was 124/82, HR 88. Recent med changes were made after ED visit 3/29 for tachycardia/palpitations.   I suggested that she take BP once daily 1-2 hours after medication so that we can see trends in values. If she is having dizziness or other concerning symptoms or systolic < 446 then she should call us back. She verbalized understanding and will continue current medications and POC. She agreed to keep track of daily Bps and will call us back as needed.   She denied any sx of chest pain, SOB, dizziness or fevers.   Call routed to [facility], [place] to further review.

## 2018-04-05 NOTE — Telephone Encounter (Signed)
With the patient's Coreg recently being titrated to 12.5 mg twice daily in the setting of tachypalpitations, we may be able to discontinue her amlodipine.  Please have her call back if her blood pressure is running in the low 938H systolic or lower and we will plan to stop amlodipine at that time.

## 2018-04-05 NOTE — Telephone Encounter (Signed)
Spoke with the pt and made her aware of Ryan's recommendation below. Pt is agreeable with the plan and verbalized understanding.

## 2018-04-10 ENCOUNTER — Other Ambulatory Visit: Payer: Self-pay

## 2018-04-10 ENCOUNTER — Ambulatory Visit: Payer: Medicare Other | Admitting: *Deleted

## 2018-04-10 ENCOUNTER — Telehealth: Payer: Self-pay | Admitting: Cardiovascular Disease

## 2018-04-10 DIAGNOSIS — R222 Localized swelling, mass and lump, trunk: Secondary | ICD-10-CM

## 2018-04-10 NOTE — Telephone Encounter (Signed)
[facility] calling  Patient had stitch left over from thoracic surgery Would like to know if our office will be able to help remove, limitations at [place] it difficult for patient to come back Transferred to [facility] for more details Adding patient to nurse visit

## 2018-04-11 NOTE — Progress Notes (Signed)
Call received from surgery center and they requested if we could remove one stitch from patient. Patient came into office and was able to remove that one stitch with no problems. Site was clean and dry with no redness. She was appreciative with no further questions.

## 2018-04-24 ENCOUNTER — Telehealth: Payer: Self-pay | Admitting: Cardiovascular Disease

## 2018-04-24 MED ORDER — NEBIVOLOL HCL 5 MG PO TABS: 5.0000 mg | ORAL_TABLET | Freq: Every day | ORAL | 4 refills | Status: DC

## 2018-04-24 NOTE — Telephone Encounter (Signed)
Spoke with the pt. Pt sts that she has been having extreme fatigue and dizziness since she was started on Amlodipine and Carvedilol.She denies palpitations, sob, cp, tachycardia.She sts that the fatigue is limiting her ADLs. She does not have the energy do the things that she enjoys. She held her meds yesterday (she VS below). She also complains of a tremor and mild LE edema that she relates to the medications. Adv the pt that I will fwd an update to [employer], [place] and we will call back with his recommendation.

## 2018-04-24 NOTE — Telephone Encounter (Signed)
Vitals are well controlled. Fatigue could be from the Coreg. LE swelling could be from the amlodipine. We can see if she tolerates Bystolic in place of Coreg. Please stop Coreg and send in Bystolic 5 mg daily with instructions to increase to 10 mg daily if tolerates and BP is > 085 systolic. Stop amlodipine.

## 2018-04-24 NOTE — Telephone Encounter (Signed)
Returned the call to the patient. She stated that she was under the impression that she would be on blood pressure medication temporarily until after her surgery.  She stated that she wants to try and not take any blood pressure medication for a few weeks and see how her blood pressure does. She has read the side effects of the blood pressure medicines online and does not want to take them. She has been advised some of those side effects are rare but she still does not want to take any.  The Bystolic will be sent in for her per her request. She stated that she will track her blood pressure and if she finds that it is staying elevate then she will try the Bystolic 5 mg.

## 2018-04-24 NOTE — Telephone Encounter (Signed)
Pt is calling back with her BP readings 4/2- 116/77 2 hours after medication, HR 78 4/3- 127/77 , HR 79 4/4- 120/77, HR 82 4/5- 117/75, HR 77 4/6- 117/80, HR 76 4/7- 106/73, HR 76 4/9- 107/69, HR 71 4/10- 119/74, HR 80 4/11- 104/72, HR 78 4/12- 109/75, HR 85 4/13- 106/69, HR 71 4/14- 108/73, HR 75 4/15- 109/75, HR 73 4/16- 113/75, HR 73 4/17- 116/79, HR 78 4/18- 110/72, HR 70 Pt states daily she is dizzy and extremely tired, and has no energy and is weak. Pt thinks this is her medication. Amlodipine and Carvedilol  Yesterday pt did not take any medicaion BP was 114/72 HR 68 Today 107/67, HR 72 Pt states she feel a little better, states she has tremors from these medications.

## 2018-04-24 NOTE — Telephone Encounter (Signed)
Noted  

## 2018-04-30 DIAGNOSIS — I2510 Atherosclerotic heart disease of native coronary artery without angina pectoris: Secondary | ICD-10-CM | POA: Insufficient documentation

## 2018-05-04 ENCOUNTER — Encounter: Payer: Self-pay | Admitting: Emergency Medicine

## 2018-05-04 ENCOUNTER — Emergency Department
Admission: EM | Admit: 2018-05-04 | Discharge: 2018-05-04 | Disposition: A | Discharge status: Home / Self Care | Payer: Medicare Other | Attending: Emergency Medicine | Admitting: Emergency Medicine

## 2018-05-04 ENCOUNTER — Emergency Department: Payer: Medicare Other

## 2018-05-04 ENCOUNTER — Telehealth: Payer: Self-pay | Admitting: Internal Medicine

## 2018-05-04 ENCOUNTER — Other Ambulatory Visit: Payer: Self-pay

## 2018-05-04 DIAGNOSIS — Z87891 Personal history of nicotine dependence: Secondary | ICD-10-CM | POA: Insufficient documentation

## 2018-05-04 DIAGNOSIS — J45901 Unspecified asthma with (acute) exacerbation: Secondary | ICD-10-CM | POA: Insufficient documentation

## 2018-05-04 DIAGNOSIS — I10 Essential (primary) hypertension: Secondary | ICD-10-CM | POA: Insufficient documentation

## 2018-05-04 DIAGNOSIS — R0602 Shortness of breath: Secondary | ICD-10-CM | POA: Diagnosis present

## 2018-05-04 HISTORY — DX: Other nonspecific abnormal finding of lung field: R91.8

## 2018-05-04 LAB — CBC WITH DIFFERENTIAL/PLATELET
Abs Immature Granulocytes: 0.03 10*3/uL (ref 0.00–0.07)
Basophils Absolute: 0.1 10*3/uL (ref 0.0–0.1)
Basophils Relative: 1 %
Eosinophils Absolute: 0.8 10*3/uL — ABNORMAL HIGH (ref 0.0–0.5)
Eosinophils Relative: 8 %
HCT: 39.3 % (ref 36.0–46.0)
Hemoglobin: 13 g/dL (ref 12.0–15.0)
Immature Granulocytes: 0 %
Lymphocytes Relative: 18 %
Lymphs Abs: 1.9 10*3/uL (ref 0.7–4.0)
MCH: 30.2 pg (ref 26.0–34.0)
MCHC: 33.1 g/dL (ref 30.0–36.0)
MCV: 91.4 fL (ref 80.0–100.0)
Monocytes Absolute: 0.5 10*3/uL (ref 0.1–1.0)
Monocytes Relative: 5 %
Neutro Abs: 7.1 10*3/uL (ref 1.7–7.7)
Neutrophils Relative %: 68 %
Platelets: 314 10*3/uL (ref 150–400)
RBC: 4.3 MIL/uL (ref 3.87–5.11)
RDW: 12.6 % (ref 11.5–15.5)
WBC: 10.4 10*3/uL (ref 4.0–10.5)
nRBC: 0 % (ref 0.0–0.2)

## 2018-05-04 LAB — COMPREHENSIVE METABOLIC PANEL
ALT: 12 U/L (ref 0–44)
AST: 22 U/L (ref 15–41)
Albumin: 4.3 g/dL (ref 3.5–5.0)
Alkaline Phosphatase: 65 U/L (ref 38–126)
Anion gap: 10 (ref 5–15)
BUN: 7 mg/dL (ref 6–20)
CO2: 25 mmol/L (ref 22–32)
Calcium: 10 mg/dL (ref 8.9–10.3)
Chloride: 103 mmol/L (ref 98–111)
Creatinine, Ser: 0.75 mg/dL (ref 0.44–1.00)
GFR calc Af Amer: >60 mL/min (ref >60)
GFR calc non Af Amer: >60 mL/min (ref >60)
Glucose, Bld: 103 mg/dL — ABNORMAL HIGH (ref 70–99)
Potassium: 4.2 mmol/L (ref 3.5–5.1)
Sodium: 138 mmol/L (ref 135–145)
Total Bilirubin: 0.7 mg/dL (ref 0.3–1.2)
Total Protein: 7.4 g/dL (ref 6.5–8.1)

## 2018-05-04 LAB — BRAIN NATRIURETIC PEPTIDE: B Natriuretic Peptide: 41 pg/mL (ref 0.0–100.0)

## 2018-05-04 LAB — TROPONIN I: Troponin I: <0.03 ng/mL (ref <0.03)

## 2018-05-04 MED ORDER — PREDNISONE 20 MG PO TABS: 40.0000 mg | ORAL_TABLET | Freq: Every day | ORAL | 0 refills | Status: AC

## 2018-05-04 MED ORDER — METHYLPREDNISOLONE SODIUM SUCC 125 MG IJ SOLR
125.0000 mg | Freq: Once | INTRAMUSCULAR | Status: AC
  Administered 2018-05-04: 14:00:00 125 mg via INTRAVENOUS
  Filled 2018-05-04: qty 2

## 2018-05-04 MED ORDER — IPRATROPIUM-ALBUTEROL 0.5-2.5 (3) MG/3ML IN SOLN
3.0000 mL | Freq: Once | RESPIRATORY_TRACT | Status: AC
  Administered 2018-05-04: 14:00:00 3 mL via RESPIRATORY_TRACT
  Filled 2018-05-04: qty 3

## 2018-05-04 NOTE — ED Notes (Signed)
Patient ambulatory to restroom  ?

## 2018-05-04 NOTE — Telephone Encounter (Signed)
Spoke to patient, let her know we are sending in rx for prednisone 40mg  for 5 days. Set up for phone visit tomorrow as she does not have a smartphone.

## 2018-05-04 NOTE — ED Provider Notes (Signed)
[facility] Emergency Department Provider Note   ____________________________________________    I have reviewed the triage vital signs and the nursing notes.   HISTORY  Chief Complaint Shortness of Breath     HPI Carmen Grimes is a 58 y.o. female who presents with complaints of shortness of breath and chest tightness.  Patient reports she feels like her chest is tight and she has been wheezing.  She has been using her nebulizers at home with relief but it is short-lived.  Denies fevers or chills.  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  She reports her pulmonologist recently started her on prednisone 40 mg but she has not taken any yet because it was just prescribed today.  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   Synovitis of right hand 02/01/2017   H/O: GI bleed    Osteoarthritis    GERD (gastroesophageal reflux disease)    Asthma    Vitamin D deficiency 07/16/2016   Anxiety 07/12/2016   Acute pain of left shoulder 03/17/2016   Impingement syndrome of left shoulder 03/17/2016   Right hip pain 12/19/2015   Primary osteoarthritis of right knee 28/36/6294   Lichen planus 76/54/6503   Chest pain 01/13/2015   Left forearm pain 06/08/2014   Acute bilateral low back  pain with bilateral sciatica 05/16/2014   Breast pain, left 05/16/2014   Cervical radiculopathy 05/16/2014   Abnormal weight gain 02/22/2014   Cervical pain (neck) 02/22/2014   Fatigue 02/22/2014   Hair loss 02/22/2014   Muscle spasm 02/22/2014   Mild intermittent asthma 11/12/2013    Past Surgical History:  Procedure Laterality Date   ABDOMINAL HYSTERECTOMY  2002   ANTERIOR FUSION CERVICAL SPINE  2009   COLONOSCOPY  2013   normal   ELECTROMAGNETIC NAVIGATION BROCHOSCOPY Left 12/12/2017   Procedure: ELECTROMAGNETIC NAVIGATION BRONCHOSCOPY;  Surgeon: Flora Lipps, MD;  Location: [facility] ORS;  Service: Cardiopulmonary;  Laterality: Left;   ESOPHAGOGASTRODUODENOSCOPY  1990   GI bleed   FLEXIBLE BRONCHOSCOPY Left 12/12/2017   Procedure: FLEXIBLE BRONCHOSCOPY;  Surgeon: Flora Lipps, MD;  Location: [facility] ORS;  Service: Cardiopulmonary;  Laterality: Left;   KNEE ARTHROSCOPY Right 2010   x3   KNEE SURGERY Left    Arthroscopy x1   LAPAROSCOPIC SUPRACERVICAL HYSTERECTOMY  2002   with BSO for fibroids/ AUB/ pelvic pain   LAPAROSCOPIC TUBAL LIGATION  1982   SHOULDER SURGERY Right    rotator cuff repair   SHOULDER SURGERY Right 2011   second rotator cuff repair   tubal reanastamosis  1985    Prior to Admission medications   Medication Sig Start Date End Date Taking? Authorizing Provider  Biotin 10000 MCG TABS Take 1 tablet by  mouth daily.    [provider]  chlorpheniramine-HYDROcodone (TUSSIONEX PENNKINETIC ER) 10-8 MG/5ML SUER Take 5 mLs by mouth every 12 (twelve) hours as needed for cough.    [provider]  estradiol (ESTRACE) 2 MG tablet Take 1 tablet (2 mg total) by mouth daily. 04/03/18   Dalia Heading, CNM  Multiple Vitamin (MULTI VITAMIN DAILY PO) Take 1 tablet by mouth daily.     [provider]  nebivolol (BYSTOLIC) 5 MG tablet Take 1 tablet (5 mg total) by mouth daily. Can increase to 10 mg daily if systolic blood pressure is  greater than 130. 04/24/18   Dunn, Areta Haber, PA-C  predniSONE (DELTASONE) 20 MG tablet Take 2 tablets (40 mg total) by mouth daily with breakfast for 5 days. 05/04/18 05/09/18  Flora Lipps, MD  VENTOLIN HFA 108 (90 Base) MCG/ACT inhaler Inhale 2 puffs into the lungs every 6 (six) hours as needed for wheezing or shortness of breath.  11/14/17   [provider]     Allergies Shellfish allergy; Tramadol; Celecoxib; Gabapentin; Hydrocodone; Hydrocodone-acetaminophen; Ibuprofen; Naproxen; Prednisone; Singulair [montelukast]; Strawberry extract; Codeine; Doxycycline; Eggs or egg-derived products; Latex; Nsaids; Strawberry flavor; and Sulfamethoxazole-trimethoprim  Family History  Problem Relation Age of Onset   Diabetes Mother    Dementia Mother        Alzheimers   Hypertension Mother    Glaucoma Mother    Prostate cancer Father 3   Stroke Father    Arthritis Father    Osteoporosis Father    Prostate cancer Brother    Breast cancer Neg Hx     Social History Social History   Tobacco Use   Smoking status: Former Smoker    Packs/day: 1.00    Years: 13.00    Pack years: 13.00   Smokeless tobacco: Never Used  Substance Use Topics   Alcohol use: Not Currently   Drug use: No    Review of Systems  Constitutional: No fever/chills Eyes: No visual changes.  ENT: No sore throat. Cardiovascular: As above Respiratory: As above Gastrointestinal: No abdominal pain.  No nausea, no vomiting.   Genitourinary: Negative for dysuria. Musculoskeletal: Negative for back pain. Skin: Negative for rash. Neurological: Negative for headaches or weakness   ____________________________________________   PHYSICAL EXAM:  VITAL SIGNS: ED Triage Vitals  Enc Vitals Group     BP 05/04/18 1307 (!) 146/94     Pulse Rate 05/04/18 1307 92     Resp 05/04/18 1307 16     Temp 05/04/18 1307 98.4 F (36.9 C)     Temp Source 05/04/18 1307 Oral     SpO2 05/04/18 1307 97 %     Weight  05/04/18 1304 66.7 kg (147 lb)     Height 05/04/18 1304 1.549 m (_0 )     Head Circumference --      Peak Flow --      Pain Score 05/04/18 1304 0     Pain Loc --      Pain Edu? --      Excl. in [place]? --     Constitutional: Alert and oriented.  Eyes: Conjunctivae are normal.  Nose: No congestion/rhinnorhea. Mouth/Throat: Mucous membranes are moist.    Cardiovascular: Normal rate, regular rhythm. Grossly normal heart sounds.  Good peripheral circulation. Respiratory: Normal respiratory effort.  No retractions.  Scattered wheezes Gastrointestinal: Soft and nontender. No distention.    Musculoskeletal: No lower extremity tenderness nor edema.  Warm and well perfused Neurologic:  Normal speech and  language. No gross focal neurologic deficits are appreciated.  Skin:  Skin is warm, dry and intact. No rash noted. Psychiatric: Mood and affect are normal. Speech and behavior are normal.  ____________________________________________   LABS (all labs ordered are listed, but only abnormal results are displayed)  Labs Reviewed  CBC WITH DIFFERENTIAL/PLATELET - Abnormal; Notable for the following components:      Result Value   Eosinophils Absolute 0.8 (*)    All other components within normal limits  COMPREHENSIVE METABOLIC PANEL - Abnormal; Notable for the following components:   Glucose, Bld 103 (*)    All other components within normal limits  TROPONIN I  BRAIN NATRIURETIC PEPTIDE   ____________________________________________  EKG  ED ECG REPORT I, Lavonia Drafts, the attending physician, personally viewed and interpreted this ECG.  Date: 05/04/2018  Rhythm: normal sinus rhythm QRS Axis: normal Intervals: normal ST/T Wave abnormalities: normal Narrative Interpretation: no evidence of acute ischemia  ____________________________________________  RADIOLOGY  Chest x-ray with left basilar opacity consistent with  scar ____________________________________________   PROCEDURES  Procedure(s) performed: No  Procedures   Critical Care performed: No ____________________________________________   INITIAL IMPRESSION / ASSESSMENT AND PLAN / ED COURSE  Pertinent labs & imaging results that were available during my care of the patient were reviewed by me and considered in my medical decision making (see chart for details).  Patient well-appearing in no acute distress, scattered wheezes on exam most consistent with asthma exacerbation.  Will give IV Solu-Medrol, DuoNeb, obtain labs, chest x-ray and reevaluate  Lab work is overall quite reassuring, chest x-ray is unremarkable.  No fevers or chills she feels much better after IV Solu-Medrol.  She will continue prednisone at home, she has nebulizers at home, return precautions discussed  Providencia Hottenstein was evaluated in Emergency Department on 05/04/2018 for the symptoms described in the history of present illness. She was evaluated in the context of the global COVID-19 pandemic, which necessitated consideration that the patient might be at risk for infection with the SARS-CoV-2 virus that causes COVID-19. Institutional protocols and algorithms that pertain to the evaluation of patients at risk for COVID-19 are in a state of rapid change based on information released by regulatory bodies including the CDC and federal and state organizations. These policies and algorithms were followed during the patient's care in the ED.     ____________________________________________   FINAL CLINICAL IMPRESSION(S) / ED DIAGNOSES  Final diagnoses:  Exacerbation of asthma, unspecified asthma severity, unspecified whether persistent        Note:  This document was prepared using Dragon voice recognition software and may include unintentional dictation errors.   Lavonia Drafts, MD 05/04/18 1455

## 2018-05-04 NOTE — Telephone Encounter (Signed)
Want to see if she can get a different inhaler  instead of albuterol and different meds for nebulizer that wont give her tachycardia and tremors. They do help breathing but comes with those side effects. They have been going on for a while but it's getting worse and the inhaler is causing her to cough up quite a bit of mucus.

## 2018-05-04 NOTE — Telephone Encounter (Signed)
Pt also does not want to come into the office for a visit due to health reasons.

## 2018-05-04 NOTE — Telephone Encounter (Signed)
Had thoracoscopy last month, primary care ordered nebulizer and she breaths better, then a few hours later feels heaviness in her chest. Using her Duoneb 4 times a day, using albuterol inhaler about 3 times a day. She is able to cough up a lot of mucus afterwards. Heart rate increasing, has been up to 112. Symptoms have been increasing but gotten worse this week.   DK please advise.

## 2018-05-04 NOTE — ED Triage Notes (Signed)
Pt reports increased SOB. Pt states has asthma and had a left lung lumpectomy in March. Pt denies fevers.

## 2018-05-04 NOTE — ED Notes (Signed)
Patient discharged home in good condition, ambulatory to POV. Patient verbalized understanding of discharge instructions and has a telehealth visit tomorrow with her PCP.

## 2018-05-04 NOTE — Telephone Encounter (Signed)
Prednisone 40 mg daily  Needs to set up televisit tomorrow

## 2018-05-05 ENCOUNTER — Emergency Department
Admission: EM | Admit: 2018-05-05 | Discharge: 2018-05-05 | Disposition: A | Discharge status: Home / Self Care | Payer: Medicare Other | Attending: Emergency Medicine | Admitting: Emergency Medicine

## 2018-05-05 ENCOUNTER — Emergency Department: Payer: Medicare Other

## 2018-05-05 ENCOUNTER — Ambulatory Visit (INDEPENDENT_AMBULATORY_CARE_PROVIDER_SITE_OTHER): Payer: Medicare Other | Admitting: Internal Medicine

## 2018-05-05 ENCOUNTER — Other Ambulatory Visit: Payer: Self-pay

## 2018-05-05 ENCOUNTER — Encounter: Payer: Self-pay | Admitting: Internal Medicine

## 2018-05-05 DIAGNOSIS — R002 Palpitations: Secondary | ICD-10-CM

## 2018-05-05 DIAGNOSIS — I10 Essential (primary) hypertension: Secondary | ICD-10-CM | POA: Diagnosis not present

## 2018-05-05 DIAGNOSIS — Z87891 Personal history of nicotine dependence: Secondary | ICD-10-CM | POA: Diagnosis not present

## 2018-05-05 DIAGNOSIS — J45909 Unspecified asthma, uncomplicated: Secondary | ICD-10-CM | POA: Diagnosis not present

## 2018-05-05 DIAGNOSIS — J4521 Mild intermittent asthma with (acute) exacerbation: Secondary | ICD-10-CM | POA: Diagnosis not present

## 2018-05-05 DIAGNOSIS — R000 Tachycardia, unspecified: Secondary | ICD-10-CM | POA: Diagnosis present

## 2018-05-05 LAB — COMPREHENSIVE METABOLIC PANEL
ALT: 12 U/L (ref 0–44)
AST: 23 U/L (ref 15–41)
Albumin: 4.2 g/dL (ref 3.5–5.0)
Alkaline Phosphatase: 71 U/L (ref 38–126)
Anion gap: 10 (ref 5–15)
BUN: 11 mg/dL (ref 6–20)
CO2: 25 mmol/L (ref 22–32)
Calcium: 9.9 mg/dL (ref 8.9–10.3)
Chloride: 104 mmol/L (ref 98–111)
Creatinine, Ser: 0.83 mg/dL (ref 0.44–1.00)
GFR calc Af Amer: >60 mL/min (ref >60)
GFR calc non Af Amer: >60 mL/min (ref >60)
Glucose, Bld: 117 mg/dL — ABNORMAL HIGH (ref 70–99)
Potassium: 4.3 mmol/L (ref 3.5–5.1)
Sodium: 139 mmol/L (ref 135–145)
Total Bilirubin: 0.6 mg/dL (ref 0.3–1.2)
Total Protein: 7.8 g/dL (ref 6.5–8.1)

## 2018-05-05 LAB — CBC
HCT: 38.6 % (ref 36.0–46.0)
Hemoglobin: 12.8 g/dL (ref 12.0–15.0)
MCH: 30.6 pg (ref 26.0–34.0)
MCHC: 33.2 g/dL (ref 30.0–36.0)
MCV: 92.3 fL (ref 80.0–100.0)
Platelets: 332 10*3/uL (ref 150–400)
RBC: 4.18 MIL/uL (ref 3.87–5.11)
RDW: 12.7 % (ref 11.5–15.5)
WBC: 23.3 10*3/uL — ABNORMAL HIGH (ref 4.0–10.5)
nRBC: 0 % (ref 0.0–0.2)

## 2018-05-05 LAB — LIPASE, BLOOD: Lipase: 21 U/L (ref 11–51)

## 2018-05-05 LAB — TROPONIN I: Troponin I: <0.03 ng/mL (ref <0.03)

## 2018-05-05 LAB — FIBRIN DERIVATIVES D-DIMER (ARMC ONLY): Fibrin derivatives D-dimer (ARMC): 493.17 ng/mL (FEU) (ref 0.00–499.00)

## 2018-05-05 NOTE — ED Notes (Signed)
patient resting comfortably, labs resulted. Md at bedside to discuss results and discharge plans.

## 2018-05-05 NOTE — ED Triage Notes (Signed)
States was feeling her heart racing at home. As per ems rate of 136 upon ems arrival.

## 2018-05-05 NOTE — ED Notes (Signed)
Peripheral IV discontinued. Catheter intact. No signs of infiltration or redness. Gauze applied to IV site.   Discharge instructions reviewed with patient. Questions fielded by this RN. Patient verbalizes understanding of instructions. Patient discharged home in stable condition per quale. No acute distress noted at time of discharge.

## 2018-05-05 NOTE — ED Provider Notes (Signed)
[facility] Emergency Department Provider Note   ____________________________________________   First MD Initiated Contact with Patient 05/05/18 1655     (approximate)  I have reviewed the triage vital signs and the nursing notes.   HISTORY  Chief Complaint Tachycardia    HPI Carmen Grimes is a 58 y.o. female reports she has a history of asthma with being treated with steroids starting yesterday.  Also has a history of lung masses that were removed in the left lung of over a month ago.  Patient reports she is doing well today, her asthma seems to be improving and she is not wheezing.  She been taking prednisone and inhalers.  She started developing a feeling like her heart was racing as she went to lay down.  This went away about the same time paramedics arrived her heart rate was initially 130.  She reports has been evaluated for this previously, and was told that this may be due to anxiety.  She had no chest pain.  No leg swelling.  No trouble breathing.  No wheezing.  Her asthma symptoms are actually getting quite a lot better.  No abdominal pain.  She reports she is feeling fine now and that the racing heart went away on its own.  She does take Cardizem for "tachycardia" reports she took that today   Past Medical History:  Diagnosis Date  . Asthma    controlled  . Dyspnea 12/2017   difficulty getting her breath at all times  . GERD (gastroesophageal reflux disease)   . H/O: GI bleed   . Hypertension   . Lung mass   . Mitral valve prolapse   . Osteoarthritis    knees    Patient Active Problem List   Diagnosis Date Noted  . H/O chest tube placement   . Pneumothorax 12/12/2017  . Lung nodule   . Degeneration of lumbar intervertebral disc 12/05/2017  . Cervical spondylosis without myelopathy 06/26/2017  . 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 Synovitis of right hand 02/01/2017  . H/O: GI bleed   . Osteoarthritis   . GERD  (gastroesophageal reflux disease)   . Asthma   . Vitamin D deficiency 07/16/2016  . Anxiety 07/12/2016  . Acute pain of left shoulder 03/17/2016  . Impingement syndrome of left shoulder 03/17/2016  . Right hip pain 12/19/2015  . Primary osteoarthritis of right knee 12/05/2015  . Lichen planus 17/00/1749  . Chest pain 01/13/2015  . Left forearm pain 06/08/2014  . Acute bilateral low back pain with bilateral sciatica 05/16/2014  . Breast pain, left 05/16/2014  . Cervical radiculopathy 05/16/2014  . Abnormal weight gain 02/22/2014  . Cervical pain (neck) 02/22/2014  . Fatigue 02/22/2014  . Hair loss 02/22/2014  . Muscle spasm 02/22/2014  . Mild intermittent asthma 11/12/2013    Past Surgical History:  Procedure Laterality Date  . ABDOMINAL HYSTERECTOMY  2002  . ANTERIOR FUSION CERVICAL SPINE  2009  . COLONOSCOPY  2013   normal  . ELECTROMAGNETIC NAVIGATION BROCHOSCOPY Left 12/12/2017   Procedure: ELECTROMAGNETIC NAVIGATION BRONCHOSCOPY;  Surgeon: Flora Lipps, MD;  Location: [facility] ORS;  Service: Cardiopulmonary;  Laterality: Left;  . ESOPHAGOGASTRODUODENOSCOPY  1990   GI bleed  . FLEXIBLE BRONCHOSCOPY Left 12/12/2017   Procedure: FLEXIBLE BRONCHOSCOPY;  Surgeon: Flora Lipps, MD;  Location: [facility] ORS;  Service: Cardiopulmonary;  Laterality: Left;  . KNEE ARTHROSCOPY Right 2010   x3  . KNEE SURGERY Left    Arthroscopy x1  . LAPAROSCOPIC SUPRACERVICAL  HYSTERECTOMY  2002   with BSO for fibroids/ AUB/ pelvic pain  . LAPAROSCOPIC TUBAL LIGATION  1982  . SHOULDER SURGERY Right    rotator cuff repair  . SHOULDER SURGERY Right 2011   second rotator cuff repair  . tubal reanastamosis  1985    Prior to Admission medications   Medication Sig Start Date End Date Taking? Authorizing Provider  Biotin 10000 MCG TABS Take 1 tablet by mouth daily.    [provider]  chlorpheniramine-HYDROcodone (TUSSIONEX PENNKINETIC ER) 10-8 MG/5ML SUER Take 5 mLs by mouth every 12 (twelve)  hours as needed for cough.    [provider]  estradiol (ESTRACE) 2 MG tablet Take 1 tablet (2 mg total) by mouth daily. 04/03/18   Dalia Heading, CNM  Multiple Vitamin (MULTI VITAMIN DAILY PO) Take 1 tablet by mouth daily.     [provider]  nebivolol (BYSTOLIC) 5 MG tablet Take 1 tablet (5 mg total) by mouth daily. Can increase to 10 mg daily if systolic blood pressure is greater than 130. 04/24/18   Dunn, Areta Haber, PA-C  predniSONE (DELTASONE) 20 MG tablet Take 2 tablets (40 mg total) by mouth daily with breakfast for 5 days. 05/04/18 05/09/18  Flora Lipps, MD  VENTOLIN HFA 108 (90 Base) MCG/ACT inhaler Inhale 2 puffs into the lungs every 6 (six) hours as needed for wheezing or shortness of breath.  11/14/17   [provider]    Allergies Shellfish allergy; Tramadol; Celecoxib; Gabapentin; Hydrocodone; Hydrocodone-acetaminophen; Ibuprofen; Naproxen; Prednisone; Singulair [montelukast]; Strawberry extract; Codeine; Doxycycline; Eggs or egg-derived products; Latex; Nsaids; Strawberry flavor; and Sulfamethoxazole-trimethoprim  Family History  Problem Relation Age of Onset  . Diabetes Mother   . Dementia Mother        Alzheimers  . Hypertension Mother   . Glaucoma Mother   . Prostate cancer Father 65  . Stroke Father   . Arthritis Father   . Osteoporosis Father   . Prostate cancer Brother   . Breast cancer Neg Hx     Social History Social History   Tobacco Use  . Smoking status: Former Smoker    Packs/day: 1.00    Years: 13.00    Pack years: 13.00  . Smokeless tobacco: Never Used  Substance Use Topics  . Alcohol use: Not Currently  . Drug use: No    Review of Systems Constitutional: No fever/chills Eyes: No visual changes. ENT: No sore throat. Cardiovascular: Denies chest pain.  Never had any chest pain.  She felt like her heart was racing it lasted maybe 30 minutes Respiratory: Denies shortness of breath.  She had "asthma" flareup on her  yesterday, but reports the symptoms are getting quite it better. Gastrointestinal: No abdominal pain.   Genitourinary: Negative for dysuria. Musculoskeletal: Negative for back pain. Skin: Negative for rash. Neurological: Negative for headaches, areas of focal weakness or numbness.    ____________________________________________   PHYSICAL EXAM:  VITAL SIGNS: ED Triage Vitals  Enc Vitals Group     BP 05/05/18 1640 (!) 155/80     Pulse Rate 05/05/18 1640 (!) 112     Resp 05/05/18 1640 18     Temp 05/05/18 1640 98.3 F (36.8 C)     Temp Source 05/05/18 1640 Oral     SpO2 05/05/18 1640 100 %     Weight 05/05/18 1643 147 lb (66.7 kg)     Height 05/05/18 1643 _0  (1.549 m)     Head Circumference --  Peak Flow --      Pain Score 05/05/18 1643 0     Pain Loc --      Pain Edu? --      Excl. in [place]? --     Constitutional: Alert and oriented. Well appearing and in no acute distress. Eyes: Conjunctivae are normal. Head: Atraumatic. Nose: No congestion/rhinnorhea. Mouth/Throat: Mucous membranes are moist. Neck: No stridor.  Cardiovascular: Just minimally elevated at a rate of 105, regular rhythm. Grossly normal heart sounds.  Good peripheral circulation. Respiratory: Normal respiratory effort.  No retractions. Lungs CTAB sent for slightly diminished with a little bit of slight dry crackling sound in the left lower base. Gastrointestinal: Soft and nontender. No distention. Musculoskeletal: No lower extremity tenderness nor edema.  No thigh swelling.  No venous cords or congestion. Neurologic:  Normal speech and language. No gross focal neurologic deficits are appreciated.  Skin:  Skin is warm, dry and intact. No rash noted. Psychiatric: Mood and affect are normal. Speech and behavior are normal.  ____________________________________________   LABS (all labs ordered are listed, but only abnormal results are displayed)  Labs Reviewed  CBC - Abnormal; Notable for the  following components:      Result Value   WBC 23.3 (*)    All other components within normal limits  COMPREHENSIVE METABOLIC PANEL - Abnormal; Notable for the following components:   Glucose, Bld 117 (*)    All other components within normal limits  TROPONIN I  LIPASE, BLOOD  FIBRIN DERIVATIVES D-DIMER ([facility] ONLY)   ____________________________________________  EKG  Reviewed interpreted me at 1650 Heart rate 110 QRS 80 QTc 400 Sinus tachycardia, no evidence of ischemia. ____________________________________________  RADIOLOGY  Dg Chest Port 1 View  Result Date: 05/05/2018 CLINICAL DATA:  Palpitations EXAM: PORTABLE CHEST 1 VIEW COMPARISON:  05/04/2018 FINDINGS: Patchy left lower lobe airspace disease is unchanged. Small left effusion unchanged. Right lung remains clear. Heart size and vascularity normal. IMPRESSION: Mild left lower lobe airspace disease is unchanged. Electronically Signed   By: Franchot Gallo M.D.   On: 05/05/2018 16:49     ____________________________________________   PROCEDURES  Procedure(s) performed: None  Procedures  Critical Care performed: No  ____________________________________________   INITIAL IMPRESSION / ASSESSMENT AND PLAN / ED COURSE  Pertinent labs & imaging results that were available during my care of the patient were reviewed by me and considered in my medical decision making (see chart for details).   Patient comes for evaluation of palpitations.  He is gone away and she denies shortness of breath.  Reports her asthma symptoms are improving on the prednisone.  She overall appears well, never had any chest pain with it and reports she is had palpitations like this intermittently in the past and a history of "tachycardia".  Very reassuring exam at this time, just slightly tachycardic but also appears slightly anxious tell me she is at similar in the past.  No obvious risk factors for PE but she did have surgery over a month ago.   Clinical Course as of May 04 1857  Fri May 05, 2018  1728 Leukocytosis   [MQ]    Clinical Course User Index [MQ] Delman Kitten, MD  Leukocytosis is noted, patient reports pulmonary symptoms improving and the fact that she is on prednisone to me highly suggest that her leukocytosis is likely secondary to steroids.  She does not have any overt infectious symptoms to today's evaluation and her examination is quite reassuring, some slight dry crackles in  the left lower lobe which I would anticipate is secondary to her surgery.  Do not see anything that would definitively be concerning for bacterial infection  No hemoptysis.  No pleuritic chest pain.  No shortness of breath.  No leg swelling.  No history of blood clots.  Non-smoker.  Carmen Grimes was evaluated in Emergency Department on 05/05/2018 for the symptoms described in the history of present illness. She was evaluated in the context of the global COVID-19 pandemic, which necessitated consideration that the patient might be at risk for infection with the SARS-CoV-2 virus that causes COVID-19. Institutional protocols and algorithms that pertain to the evaluation of patients at risk for COVID-19 are in a state of rapid change based on information released by regulatory bodies including the CDC and federal and state organizations. These policies and algorithms were followed during the patient's care in the ED.  ----------------------------------------- 6:58 PM on 05/05/2018 -----------------------------------------  Patient vital signs normalized including heart rate.  Resting comfortably asymptomatic.  Discussed with patient, she is very comfortable with plan for discharge.  D-dimer less than 500. ____________________________________________   FINAL CLINICAL IMPRESSION(S) / ED DIAGNOSES  Final diagnoses:  Palpitations        Note:  This document was prepared using Dragon voice recognition software and may include unintentional dictation  errors       Delman Kitten, MD 05/05/18 1859

## 2018-05-05 NOTE — Patient Instructions (Signed)
Continue steroids as prescribed  Inhalers as prescribed  AVOID CATS

## 2018-05-05 NOTE — Progress Notes (Addendum)
     VIDEO/TELEPHONE VISIT    In the setting of the current Covid19 crisis, you are scheduled for a  visit with me on 05/05/2018  Just as we do with many in-office visits, in order for you to participate in this visit, we must obtain consent.   I can obtain your verbal consent now.  PATIENT AGREES AND CONFIRMS -YES This Visit has Audio and Visual Capabilities for optimal patient care experience   Evaluation Performed:  Follow-up visit  This visit type was conducted due to national recommendations for restrictions regarding the COVID-19 Pandemic (e.g. social distancing).  This format is felt to be most appropriate for this patient at this time.  All issues noted in this document were discussed and addressed.     Virtual Visit via Telephone Note     I connected with patient on 05/05/2018  by telephone and verified that I am speaking with the correct person using two identifiers.   I discussed the limitations, risks, security and privacy concerns of performing an evaluation and management service by telephone and the availability of in person appointments. I also discussed with the patient that there may be a patient responsible charge related to this service. The patient expressed understanding and agreed to proceed.   Location of the patient: Home Location of provider: Home Participating persons: Patient and provider only    Telephone Visit Patient agrees to telephone consent  Patient was identified with 2 patient identifiers  Due to COVID restrictions, I am unable to examine patient and patients understands the risks and benefits.  CC Asthma exacerbation  HPI Patient with acute ASTHMA exacerbation Was seen in ER yesterday and was given IV steroid and started on Oral prednisone which I prescribed 2 days ago.  No signs  of distress at this time She feels much better with Steroids and breathing treatment   ROS +wheezing +SOB All other ROS negative.   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   Follow up in 3 months.  Total time spent 25 minutes    COVID-19 Education: The signs and symptoms of COVID-19 were discussed with the patient and how to seek care for testing (follow up with PCP or arrange E-visit).  The importance of social distancing was discussed today.   Patient satisfied with Plan of action and management. All questions answered  Corrin Parker, M.D.  [facility]  Medical Director [place] Director [facility] Cardio-Pulmonary Department

## 2018-05-05 NOTE — ED Notes (Signed)
Md at bedside to eval. Labs xray and r/o PE plan of care.

## 2018-05-06 ENCOUNTER — Other Ambulatory Visit: Payer: Self-pay

## 2018-05-06 ENCOUNTER — Emergency Department: Payer: Medicare Other

## 2018-05-06 ENCOUNTER — Emergency Department
Admission: EM | Admit: 2018-05-06 | Discharge: 2018-05-06 | Disposition: A | Discharge status: Home / Self Care | Payer: Medicare Other | Attending: Emergency Medicine | Admitting: Emergency Medicine

## 2018-05-06 DIAGNOSIS — J45909 Unspecified asthma, uncomplicated: Secondary | ICD-10-CM | POA: Diagnosis not present

## 2018-05-06 DIAGNOSIS — J189 Pneumonia, unspecified organism: Secondary | ICD-10-CM

## 2018-05-06 DIAGNOSIS — Z87891 Personal history of nicotine dependence: Secondary | ICD-10-CM | POA: Diagnosis not present

## 2018-05-06 DIAGNOSIS — J181 Lobar pneumonia, unspecified organism: Secondary | ICD-10-CM | POA: Insufficient documentation

## 2018-05-06 DIAGNOSIS — I10 Essential (primary) hypertension: Secondary | ICD-10-CM | POA: Diagnosis present

## 2018-05-06 LAB — CBC WITH DIFFERENTIAL/PLATELET
Abs Immature Granulocytes: 0.09 10*3/uL — ABNORMAL HIGH (ref 0.00–0.07)
Basophils Absolute: 0 10*3/uL (ref 0.0–0.1)
Basophils Relative: 0 %
Eosinophils Absolute: 0 10*3/uL (ref 0.0–0.5)
Eosinophils Relative: 0 %
HCT: 38 % (ref 36.0–46.0)
Hemoglobin: 12.5 g/dL (ref 12.0–15.0)
Immature Granulocytes: 1 %
Lymphocytes Relative: 18 %
Lymphs Abs: 3.5 10*3/uL (ref 0.7–4.0)
MCH: 30.5 pg (ref 26.0–34.0)
MCHC: 32.9 g/dL (ref 30.0–36.0)
MCV: 92.7 fL (ref 80.0–100.0)
Monocytes Absolute: 1 10*3/uL (ref 0.1–1.0)
Monocytes Relative: 5 %
Neutro Abs: 14.8 10*3/uL — ABNORMAL HIGH (ref 1.7–7.7)
Neutrophils Relative %: 76 %
Platelets: 330 10*3/uL (ref 150–400)
RBC: 4.1 MIL/uL (ref 3.87–5.11)
RDW: 12.6 % (ref 11.5–15.5)
WBC: 19.4 10*3/uL — ABNORMAL HIGH (ref 4.0–10.5)
nRBC: 0 % (ref 0.0–0.2)

## 2018-05-06 LAB — BASIC METABOLIC PANEL
Anion gap: 9 (ref 5–15)
BUN: 11 mg/dL (ref 6–20)
CO2: 26 mmol/L (ref 22–32)
Calcium: 9 mg/dL (ref 8.9–10.3)
Chloride: 103 mmol/L (ref 98–111)
Creatinine, Ser: 0.7 mg/dL (ref 0.44–1.00)
GFR calc Af Amer: >60 mL/min (ref >60)
GFR calc non Af Amer: >60 mL/min (ref >60)
Glucose, Bld: 96 mg/dL (ref 70–99)
Potassium: 4.2 mmol/L (ref 3.5–5.1)
Sodium: 138 mmol/L (ref 135–145)

## 2018-05-06 LAB — TROPONIN I: Troponin I: <0.03 ng/mL (ref <0.03)

## 2018-05-06 LAB — LACTIC ACID, PLASMA: Lactic Acid, Venous: 1.2 mmol/L (ref 0.5–1.9)

## 2018-05-06 MED ORDER — SODIUM CHLORIDE 0.9 % IV SOLN
1.0000 g | Freq: Once | INTRAVENOUS | Status: AC
  Administered 2018-05-06: 1 g via INTRAVENOUS
  Filled 2018-05-06: qty 10

## 2018-05-06 MED ORDER — AZITHROMYCIN 500 MG PO TABS
500.0000 mg | ORAL_TABLET | Freq: Once | ORAL | Status: AC
  Administered 2018-05-06: 500 mg via ORAL
  Filled 2018-05-06: qty 1

## 2018-05-06 MED ORDER — AMOXICILLIN-POT CLAVULANATE 875-125 MG PO TABS: 1.0000 | ORAL_TABLET | Freq: Two times a day (BID) | ORAL | 0 refills | Status: AC

## 2018-05-06 MED ORDER — AZITHROMYCIN 250 MG PO TABS: ORAL_TABLET | ORAL | 0 refills | Status: DC

## 2018-05-06 NOTE — ED Triage Notes (Signed)
Patient reports she felt tight in her upper chest earlier, took her prednisone and her neb treatment the way she was supposed to.  Then her face felt flushed and her blood pressure was elevated.

## 2018-05-06 NOTE — ED Notes (Signed)
Pt placed on cardiac monitor per MD; given warm blanket for comfort; no complaints at this time

## 2018-05-06 NOTE — ED Notes (Signed)
Spoke with Dr. Joni Fears regarding patient.  No labs at this time, just EKG.

## 2018-05-06 NOTE — ED Provider Notes (Signed)
[facility] Emergency Department Provider Note  ____________________________________________  Time seen: Approximately 10:00 PM  I have reviewed the triage vital signs and the nursing notes.   HISTORY  Chief Complaint Hypertension   HPI Blaike Newburn is a 58 y.o. female with a history of asthma currently on prednisone for an asthma exacerbation who presents for evaluation of hypertension.  Patient was seen here yesterday after being started on prednisone with palpitations.  Had essentially benign work-up and was discharged home.  Today she reports that she felt flushed in the face and when she check her blood pressure was in the 160s.  She denies any history of hypertension.  She was concerned and came to the emergency room for further evaluation.  She is currently on 80 mg of daily prednisone and using her nebulizer machine for an asthma exacerbation.  She denies chest pain or shortness of breath, cough, fever, headache.  Past Medical History:  Diagnosis Date  . Asthma    controlled  . Dyspnea 12/2017   difficulty getting her breath at all times  . GERD (gastroesophageal reflux disease)   . H/O: GI bleed   . Hypertension   . Lung mass   . Mitral valve prolapse   . Osteoarthritis    knees    Patient Active Problem List   Diagnosis Date Noted  . H/O chest tube placement   . Pneumothorax 12/12/2017  . Lung nodule   . Degeneration of lumbar intervertebral disc 12/05/2017  . Cervical spondylosis without myelopathy 06/26/2017  . 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 Synovitis of right hand 02/01/2017  . H/O: GI bleed   . Osteoarthritis   . GERD (gastroesophageal reflux disease)   . Asthma   . Vitamin D deficiency 07/16/2016  . Anxiety 07/12/2016  . Acute pain of left shoulder 03/17/2016  . Impingement syndrome of left shoulder 03/17/2016  . Right hip pain 12/19/2015  . Primary osteoarthritis of right knee 12/05/2015  . Lichen planus  00/37/0488  . Chest pain 01/13/2015  . Left forearm pain 06/08/2014  . Acute bilateral low back pain with bilateral sciatica 05/16/2014  . Breast pain, left 05/16/2014  . Cervical radiculopathy 05/16/2014  . Abnormal weight gain 02/22/2014  . Cervical pain (neck) 02/22/2014  . Fatigue 02/22/2014  . Hair loss 02/22/2014  . Muscle spasm 02/22/2014  . Mild intermittent asthma 11/12/2013    Past Surgical History:  Procedure Laterality Date  . ABDOMINAL HYSTERECTOMY  2002  . ANTERIOR FUSION CERVICAL SPINE  2009  . COLONOSCOPY  2013   normal  . ELECTROMAGNETIC NAVIGATION BROCHOSCOPY Left 12/12/2017   Procedure: ELECTROMAGNETIC NAVIGATION BRONCHOSCOPY;  Surgeon: Flora Lipps, MD;  Location: [facility] ORS;  Service: Cardiopulmonary;  Laterality: Left;  . ESOPHAGOGASTRODUODENOSCOPY  1990   GI bleed  . FLEXIBLE BRONCHOSCOPY Left 12/12/2017   Procedure: FLEXIBLE BRONCHOSCOPY;  Surgeon: Flora Lipps, MD;  Location: [facility] ORS;  Service: Cardiopulmonary;  Laterality: Left;  . KNEE ARTHROSCOPY Right 2010   x3  . KNEE SURGERY Left    Arthroscopy x1  . LAPAROSCOPIC SUPRACERVICAL HYSTERECTOMY  2002   with BSO for fibroids/ AUB/ pelvic pain  . LAPAROSCOPIC TUBAL LIGATION  1982  . SHOULDER SURGERY Right    rotator cuff repair  . SHOULDER SURGERY Right 2011   second rotator cuff repair  . tubal reanastamosis  1985    Prior to Admission medications   Medication Sig Start Date End Date Taking? Authorizing Provider  amoxicillin-clavulanate (AUGMENTIN) 875-125 MG tablet Take  1 tablet by mouth 2 (two) times daily for 10 days. 05/06/18 05/16/18  Rudene Re, MD  azithromycin [facility]) 250 MG tablet Take 1 a day for 4 days 05/06/18   Alfred Levins, [place], MD  Biotin 10000 MCG TABS Take 1 tablet by mouth daily.    [provider]  chlorpheniramine-HYDROcodone (TUSSIONEX PENNKINETIC ER) 10-8 MG/5ML SUER Take 5 mLs by mouth every 12 (twelve) hours as needed for cough.    [provider]   estradiol (ESTRACE) 2 MG tablet Take 1 tablet (2 mg total) by mouth daily. 04/03/18   Dalia Heading, CNM  Multiple Vitamin (MULTI VITAMIN DAILY PO) Take 1 tablet by mouth daily.     [provider]  nebivolol (BYSTOLIC) 5 MG tablet Take 1 tablet (5 mg total) by mouth daily. Can increase to 10 mg daily if systolic blood pressure is greater than 130. 04/24/18   Dunn, Areta Haber, PA-C  predniSONE (DELTASONE) 20 MG tablet Take 2 tablets (40 mg total) by mouth daily with breakfast for 5 days. 05/04/18 05/09/18  Flora Lipps, MD  VENTOLIN HFA 108 (90 Base) MCG/ACT inhaler Inhale 2 puffs into the lungs every 6 (six) hours as needed for wheezing or shortness of breath.  11/14/17   [provider]    Allergies Shellfish allergy; Tramadol; Celecoxib; Gabapentin; Hydrocodone; Hydrocodone-acetaminophen; Ibuprofen; Naproxen; Prednisone; Singulair [montelukast]; Strawberry extract; Codeine; Doxycycline; Eggs or egg-derived products; Latex; Nsaids; Strawberry flavor; and Sulfamethoxazole-trimethoprim  Family History  Problem Relation Age of Onset  . Diabetes Mother   . Dementia Mother        Alzheimers  . Hypertension Mother   . Glaucoma Mother   . Prostate cancer Father 68  . Stroke Father   . Arthritis Father   . Osteoporosis Father   . Prostate cancer Brother   . Breast cancer Neg Hx     Social History Social History   Tobacco Use  . Smoking status: Former Smoker    Packs/day: 1.00    Years: 13.00    Pack years: 13.00  . Smokeless tobacco: Never Used  Substance Use Topics  . Alcohol use: Not Currently  . Drug use: No    Review of Systems  Constitutional: Negative for fever. Eyes: Negative for visual changes. ENT: Negative for sore throat. Neck: No neck pain  Cardiovascular: Negative for chest pain. Respiratory: Negative for shortness of breath. Gastrointestinal: Negative for abdominal pain, vomiting or diarrhea. Genitourinary: Negative for dysuria. Musculoskeletal:  Negative for back pain. Skin: Negative for rash. Neurological: Negative for headaches, weakness or numbness. Psych: No SI or HI  ____________________________________________   PHYSICAL EXAM:  VITAL SIGNS: ED Triage Vitals [05/06/18 1932]  Enc Vitals Group     BP (!) 167/90     Pulse Rate 86     Resp 18     Temp 98.2 F (36.8 C)     Temp Source Oral     SpO2 99 %     Weight      Height      Head Circumference      Peak Flow      Pain Score 0     Pain Loc      Pain Edu?      Excl. in [place]?     Constitutional: Alert and oriented. Well appearing and in no apparent distress. HEENT:      Head: Normocephalic and atraumatic.         Eyes: Conjunctivae are normal. Sclera is non-icteric.  Mouth/Throat: Mucous membranes are moist.       Neck: Supple with no signs of meningismus. Cardiovascular: Regular rate and rhythm. No murmurs, gallops, or rubs. 2+ symmetrical distal pulses are present in all extremities. No JVD. Respiratory: Normal respiratory effort. Lungs are clear to auscultation bilaterally. No wheezes, crackles, or rhonchi.  Gastrointestinal: Soft, non tender, and non distended with positive bowel sounds. No rebound or guarding. Musculoskeletal: Nontender with normal range of motion in all extremities. No edema, cyanosis, or erythema of extremities. Neurologic: Normal speech and language. Face is symmetric. Moving all extremities. No gross focal neurologic deficits are appreciated. Skin: Skin is warm, dry and intact. No rash noted. Psychiatric: Mood and affect are normal. Speech and behavior are normal.  ____________________________________________   LABS (all labs ordered are listed, but only abnormal results are displayed)  Labs Reviewed  CBC WITH DIFFERENTIAL/PLATELET - Abnormal; Notable for the following components:      Result Value   WBC 19.4 (*)    Neutro Abs 14.8 (*)    Abs Immature Granulocytes 0.09 (*)    All other components within normal limits   BASIC METABOLIC PANEL  LACTIC ACID, PLASMA  TROPONIN I   ____________________________________________  EKG  ED ECG REPORT I, Rudene Re, the attending physician, personally viewed and interpreted this ECG.  Normal sinus rhythm, rate of 81, normal intervals, normal axis, no ST elevations or depressions.  Unchanged from prior. ____________________________________________  RADIOLOGY  I have personally reviewed the images performed during this visit and I agree with the Radiologist's read.   Interpretation by Radiologist:  Dg Chest 2 View  Result Date: 05/06/2018 CLINICAL DATA:  Cough EXAM: CHEST - 2 VIEW COMPARISON:  05/05/2018, remote studies dating back to 01/08/2018 FINDINGS: There is increasing lingular opacity along the left heart border. This is concerning for infiltrate/pneumonia. Right lung clear. Heart is normal size. No effusions or acute bony abnormality. IMPRESSION: Increasing lingular infiltrate along the left heart border at the left lung base concerning for pneumonia. Electronically Signed   By: Rolm Baptise M.D.   On: 05/06/2018 22:46      ____________________________________________   PROCEDURES  Procedure(s) performed: None Procedures Critical Care performed:  None ____________________________________________   INITIAL IMPRESSION / ASSESSMENT AND PLAN / ED COURSE  58 y.o. female with a history of asthma currently on prednisone for an asthma exacerbation who presents for evaluation of hypertension in the setting of recently being started on prednisone.  Review of epic shows that patient is supposed to be on 40 mg daily.  Patient tells me that she is taking 80 mg daily which is most likely the cause of her symptoms.  She was here yesterday for palpitations in the setting of prednisone.  She was found to have leukocytosis which was attributed to prednisone, normal troponin and d-dimer.  Her CXR was concerning for left lower lobe airspace disease.  Unclear if  this is acute pneumonia or just scarring from prior surgery where patient had a benign mass removed from her lungs.  She denies fever.  I will repeat labs at this time and will start her on antibiotics due to elevated white count of 23 seen yesterday.  Her biggest concern is her blood pressure which is slightly elevated but she is asymptomatic from it.  Most likely due to prednisone.  Will not change it at this time.    _________________________ 10:53 PM on 05/06/2018 -----------------------------------------  White count is elevated but trending down from 23 yesterday to 19.  Chest x-ray shows progression of left lower lobe infiltrate.  Lactic is normal, no temp, no tachycardia, no tachypnea, no hypoxia, therefore no evidence of sepsis.  Patient was given Rocephin and azithromycin.  Should be discharged home on Augmentin and azithromycin.  Recommended taking 40 mg of prednisone daily.  Discussed close follow-up with PCP and standard return precautions   As part of my medical decision making, I reviewed the following data within the [place] notes reviewed and incorporated, Labs reviewed , EKG interpreted , Old EKG reviewed, Old chart reviewed, Radiograph reviewed , Notes from prior ED visits and [place] Controlled Substance Database    Pertinent labs & imaging results that were available during my care of the patient were reviewed by me and considered in my medical decision making (see chart for details).    ____________________________________________   FINAL CLINICAL IMPRESSION(S) / ED DIAGNOSES  Final diagnoses:  Community acquired pneumonia of left lower lobe of lung ([place])      NEW MEDICATIONS STARTED DURING THIS VISIT:  ED Discharge Orders         Ordered    amoxicillin-clavulanate (AUGMENTIN) 875-125 MG tablet  2 times daily     05/06/18 2252    azithromycin (ZITHROMAX) 250 MG tablet     05/06/18 2252           Note:  This document was prepared  using Dragon voice recognition software and may include unintentional dictation errors.    Alfred Levins, [place], MD 05/06/18 2255

## 2018-05-08 ENCOUNTER — Telehealth: Payer: Self-pay | Admitting: Internal Medicine

## 2018-05-08 NOTE — Telephone Encounter (Signed)
Called and spoke to pt. Pt states that she was seen at [facility] on 05/06/18 and was dx with PNA. Pt was given amoxicillin 875mg  and zithro 250.  During this visit she was also instructed to decrease 80mg  of prednisone to 20mg , due to 80mg  causing palpitations. Rx for prednisone prescribed on 05/04/18 was for 40mg . Pt was under the impression that she was taking 80mg  (2 tabs of 40mg ), however she is taking 2 tabs of 20mg  (40mg  total). I clarified this with the pt, who stated that her Rx was in fact for 40mg  daily.    Pt stated that she then was then see at [place] on 05/07/2018 for continuous rattling on left side with deep breathing. Pt stated that she was told at [place] that's he does not have PNA afte a CXR and CT, and was instructed to stop abx.  I have spoken to Dr. Mortimer Fries verbally, who recommends continuing prednisone and abx and to make pt aware that rattling can be present for 6-8 weeks. Also advised pt to avoid cats. Pt stated that she gave her cat away on Saturday. Pt voiced her understanding and had no further questions.  Nothing further is needed.

## 2018-05-08 NOTE — Telephone Encounter (Signed)
Pt stated that she had an astma attack and Dr. Mortimer Fries put her on prednisone and then she later had to be rushed to ER over weekend where Dr. Michela Pitcher that the prednisone was way too high. Went to [facility] and they diagnosed her with pnuemonia but then another doctor said she doesnt have pnuemonia so she wants to get Dr. Zoila Shutter opinion and wants him to look over her test to see what he says.

## 2018-05-09 ENCOUNTER — Emergency Department
Admission: EM | Admit: 2018-05-09 | Discharge: 2018-05-09 | Disposition: A | Discharge status: Home / Self Care | Payer: Medicare Other | Attending: Emergency Medicine | Admitting: Emergency Medicine

## 2018-05-09 ENCOUNTER — Other Ambulatory Visit: Payer: Self-pay

## 2018-05-09 DIAGNOSIS — Z91040 Latex allergy status: Secondary | ICD-10-CM | POA: Diagnosis not present

## 2018-05-09 DIAGNOSIS — J45909 Unspecified asthma, uncomplicated: Secondary | ICD-10-CM | POA: Insufficient documentation

## 2018-05-09 DIAGNOSIS — Z87891 Personal history of nicotine dependence: Secondary | ICD-10-CM | POA: Insufficient documentation

## 2018-05-09 DIAGNOSIS — R002 Palpitations: Secondary | ICD-10-CM | POA: Insufficient documentation

## 2018-05-09 DIAGNOSIS — R000 Tachycardia, unspecified: Secondary | ICD-10-CM | POA: Diagnosis present

## 2018-05-09 DIAGNOSIS — I10 Essential (primary) hypertension: Secondary | ICD-10-CM | POA: Insufficient documentation

## 2018-05-09 DIAGNOSIS — Z79899 Other long term (current) drug therapy: Secondary | ICD-10-CM | POA: Insufficient documentation

## 2018-05-09 NOTE — Discharge Instructions (Signed)
The sensation of your heart racing is most likely due to the albuterol nebulizer.  As long as her heart rate is not above 130, you do not have dizziness, chest pain, or shortness of breath it is okay to stay home and wait for it to go away.  Follow-up with your primary care doctor.  Continue to take your inhaler and antibiotics as prescribed.

## 2018-05-09 NOTE — ED Provider Notes (Signed)
[facility] Emergency Department Provider Note  ____________________________________________  Time seen: Approximately 8:46 AM  I have reviewed the triage vital signs and the nursing notes.   HISTORY  Chief Complaint Tachycardia   HPI Carmen Grimes is a 58 y.o. female with a history of asthma who presents for evaluation of tachycardia.  Patient was seen here 3 days ago for the same.  At that time she was diagnosed with pneumonia.  She had been started on prednisone by her pulmonologist and albuterol nebulizer at home.  I started her on Augmentin and Z-Pak for her pneumonia.  She reports that she was doing well when she woke up this morning.  She took her prednisone, 1 hour later she took an albuterol nebulizer treatment.  She was then praying and felt that her heart was racing really fast.  Because she lives alone she was worried and decided to come to the emergency room.  She reports feeling back to normal at this time.  She had no chest pain, no shortness of breath, no fever, no dizziness.  Patient reports that she keeps having the same sensation every time she uses her nebulizer and her prednisone together.  Past Medical History:  Diagnosis Date  . Asthma    controlled  . Dyspnea 12/2017   difficulty getting her breath at all times  . GERD (gastroesophageal reflux disease)   . H/O: GI bleed   . Hypertension   . Lung mass   . Mitral valve prolapse   . Osteoarthritis    knees    Patient Active Problem List   Diagnosis Date Noted  . H/O chest tube placement   . Pneumothorax 12/12/2017  . Lung nodule   . Degeneration of lumbar intervertebral disc 12/05/2017  . Cervical spondylosis without myelopathy 06/26/2017  . 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 Synovitis of right hand 02/01/2017  . H/O: GI bleed   . Osteoarthritis   . GERD (gastroesophageal reflux disease)   . Asthma   . Vitamin D deficiency 07/16/2016  . Anxiety 07/12/2016  .  Acute pain of left shoulder 03/17/2016  . Impingement syndrome of left shoulder 03/17/2016  . Right hip pain 12/19/2015  . Primary osteoarthritis of right knee 12/05/2015  . Lichen planus 16/10/9602  . Chest pain 01/13/2015  . Left forearm pain 06/08/2014  . Acute bilateral low back pain with bilateral sciatica 05/16/2014  . Breast pain, left 05/16/2014  . Cervical radiculopathy 05/16/2014  . Abnormal weight gain 02/22/2014  . Cervical pain (neck) 02/22/2014  . Fatigue 02/22/2014  . Hair loss 02/22/2014  . Muscle spasm 02/22/2014  . Mild intermittent asthma 11/12/2013    Past Surgical History:  Procedure Laterality Date  . ABDOMINAL HYSTERECTOMY  2002  . ANTERIOR FUSION CERVICAL SPINE  2009  . COLONOSCOPY  2013   normal  . ELECTROMAGNETIC NAVIGATION BROCHOSCOPY Left 12/12/2017   Procedure: ELECTROMAGNETIC NAVIGATION BRONCHOSCOPY;  Surgeon: Flora Lipps, MD;  Location: [facility] ORS;  Service: Cardiopulmonary;  Laterality: Left;  . ESOPHAGOGASTRODUODENOSCOPY  1990   GI bleed  . FLEXIBLE BRONCHOSCOPY Left 12/12/2017   Procedure: FLEXIBLE BRONCHOSCOPY;  Surgeon: Flora Lipps, MD;  Location: [facility] ORS;  Service: Cardiopulmonary;  Laterality: Left;  . KNEE ARTHROSCOPY Right 2010   x3  . KNEE SURGERY Left    Arthroscopy x1  . LAPAROSCOPIC SUPRACERVICAL HYSTERECTOMY  2002   with BSO for fibroids/ AUB/ pelvic pain  . LAPAROSCOPIC TUBAL LIGATION  1982  . SHOULDER SURGERY Right  rotator cuff repair  . SHOULDER SURGERY Right 2011   second rotator cuff repair  . tubal reanastamosis  1985    Prior to Admission medications   Medication Sig Start Date End Date Taking? Authorizing Provider  amoxicillin-clavulanate (AUGMENTIN) 875-125 MG tablet Take 1 tablet by mouth 2 (two) times daily for 10 days. 05/06/18 05/16/18  Rudene Re, MD  azithromycin [facility]) 250 MG tablet Take 1 a day for 4 days 05/06/18   Alfred Levins, [place], MD  Biotin 10000 MCG TABS Take 1 tablet by mouth daily.     [provider]  chlorpheniramine-HYDROcodone (TUSSIONEX PENNKINETIC ER) 10-8 MG/5ML SUER Take 5 mLs by mouth every 12 (twelve) hours as needed for cough.    [provider]  estradiol (ESTRACE) 2 MG tablet Take 1 tablet (2 mg total) by mouth daily. 04/03/18   Dalia Heading, CNM  Multiple Vitamin (MULTI VITAMIN DAILY PO) Take 1 tablet by mouth daily.     [provider]  nebivolol (BYSTOLIC) 5 MG tablet Take 1 tablet (5 mg total) by mouth daily. Can increase to 10 mg daily if systolic blood pressure is greater than 130. 04/24/18   Dunn, Areta Haber, PA-C  predniSONE (DELTASONE) 20 MG tablet Take 2 tablets (40 mg total) by mouth daily with breakfast for 5 days. 05/04/18 05/09/18  Flora Lipps, MD  VENTOLIN HFA 108 (90 Base) MCG/ACT inhaler Inhale 2 puffs into the lungs every 6 (six) hours as needed for wheezing or shortness of breath.  11/14/17   [provider]    Allergies Shellfish allergy; Tramadol; Celecoxib; Gabapentin; Hydrocodone; Hydrocodone-acetaminophen; Ibuprofen; Naproxen; Prednisone; Singulair [montelukast]; Strawberry extract; Codeine; Doxycycline; Eggs or egg-derived products; Latex; Nsaids; Strawberry flavor; and Sulfamethoxazole-trimethoprim  Family History  Problem Relation Age of Onset  . Diabetes Mother   . Dementia Mother        Alzheimers  . Hypertension Mother   . Glaucoma Mother   . Prostate cancer Father 7  . Stroke Father   . Arthritis Father   . Osteoporosis Father   . Prostate cancer Brother   . Breast cancer Neg Hx     Social History Social History   Tobacco Use  . Smoking status: Former Smoker    Packs/day: 1.00    Years: 13.00    Pack years: 13.00  . Smokeless tobacco: Never Used  Substance Use Topics  . Alcohol use: Not Currently  . Drug use: No    Review of Systems  Constitutional: Negative for fever. Eyes: Negative for visual changes. ENT: Negative for sore throat. Neck: No neck pain  Cardiovascular:  Negative for chest pain. + palpitations Respiratory: Negative for shortness of breath. Gastrointestinal: Negative for abdominal pain, vomiting or diarrhea. Genitourinary: Negative for dysuria. Musculoskeletal: Negative for back pain. Skin: Negative for rash. Neurological: Negative for headaches, weakness or numbness. Psych: No SI or HI  ____________________________________________   PHYSICAL EXAM:  VITAL SIGNS: ED Triage Vitals  Enc Vitals Group     BP 05/09/18 0827 137/85     Pulse Rate 05/09/18 0827 (!) 107     Resp 05/09/18 0827 18     Temp 05/09/18 0827 98.4 F (36.9 C)     Temp Source 05/09/18 0827 Oral     SpO2 05/09/18 0827 97 %     Weight 05/09/18 0827 147 lb (66.7 kg)     Height 05/09/18 0827 _0  (1.549 m)     Head Circumference --      Peak Flow --  Pain Score 05/09/18 0830 2     Pain Loc --      Pain Edu? --      Excl. in [place]? --     Constitutional: Alert and oriented. Well appearing and in no apparent distress. HEENT:      Head: Normocephalic and atraumatic.         Eyes: Conjunctivae are normal. Sclera is non-icteric.       Mouth/Throat: Mucous membranes are moist.       Neck: Supple with no signs of meningismus. Cardiovascular: Tachycardic with regular rhythm. No murmurs, gallops, or rubs. 2+ symmetrical distal pulses are present in all extremities. No JVD. Respiratory: Normal respiratory effort. Lungs are clear to auscultation bilaterally. No wheezes, crackles, or rhonchi.  Gastrointestinal: Soft, non tender, and non distended with positive bowel sounds. No rebound or guarding. Musculoskeletal: Nontender with normal range of motion in all extremities. No edema, cyanosis, or erythema of extremities. Neurologic: Normal speech and language. Face is symmetric. Moving all extremities. No gross focal neurologic deficits are appreciated. Skin: Skin is warm, dry and intact. No rash noted. Psychiatric: Mood and affect are normal. Speech and behavior are  normal.  ____________________________________________   LABS (all labs ordered are listed, but only abnormal results are displayed)  Labs Reviewed - No data to display ____________________________________________  EKG  ED ECG REPORT I, Rudene Re, the attending physician, personally viewed and interpreted this ECG.  Sinus tachycardia, rate of 106, normal intervals, normal axis, T wave inversions in inferior lateral leads, no ST elevations.  Unchanged from prior. ____________________________________________  RADIOLOGY  none  ____________________________________________   PROCEDURES  Procedure(s) performed: None Procedures Critical Care performed:  None ____________________________________________   INITIAL IMPRESSION / ASSESSMENT AND PLAN / ED COURSE   58 y.o. female with a history of asthma who presents for evaluation of brief episode of palpitations after taking prednisone and albuterol nebulizer this am. Patient is well appearing and in no distress, heart rate is between 98 and 100 upon my evaluation.  EKG showing sinus tachycardia with no evidence of dysrhythmias.  Will monitor on telemetry.  This is patient's second visit in the emergency room for similar, usually happens after she uses her albuterol nebulizer.  Most likely just normal physiological response to the albuterol.  Patient has no chest pain or shortness of breath, no tachypnea, no hypoxia, no risk factors for PE otherwise.  No signs of sepsis with no fever.  She reports that her cough and the rattling in her chest have significant improved after being started on antibiotics.  I do feel patient needs repeat labs at this time.  Will monitor on telemetry for 1 hour to ensure no evidence of dysrhythmias.     _________________________ 9:22 AM on 05/09/2018 -----------------------------------------   Vitals:   05/09/18 0900 05/09/18 0917  BP: 111/68   Pulse: 86 76  Resp: 15 18  Temp:    SpO2: 97% 100%     Patient monitored for an hour with no evidence of dysrhythmias.  Her heart rate now is 77.  Clinically patient looks extremely well-appearing with normal work of breathing.  Again presentation is most likely concern for normal physiological response to albuterol.  Patient was again reassured.  Discussed my standard return precautions and close follow-up with her PCP.   As part of my medical decision making, I reviewed the following data within the [place] notes reviewed and incorporated, EKG interpreted  Old chart reviewed, Notes from prior ED  visits and [place] Controlled Substance Database    Pertinent labs & imaging results that were available during my care of the patient were reviewed by me and considered in my medical decision making (see chart for details).    ____________________________________________   FINAL CLINICAL IMPRESSION(S) / ED DIAGNOSES  Final diagnoses:  Palpitations      NEW MEDICATIONS STARTED DURING THIS VISIT:  ED Discharge Orders    None       Note:  This document was prepared using Dragon voice recognition software and may include unintentional dictation errors.    Alfred Levins, [place], MD 05/09/18 [phone]

## 2018-05-09 NOTE — ED Triage Notes (Addendum)
Pt c/o feeling like her heart is racing states she is currently taking prednisone and inhaler for lung infection,

## 2018-05-11 ENCOUNTER — Telehealth: Payer: Self-pay | Admitting: Physician Assistant

## 2018-05-11 NOTE — Telephone Encounter (Signed)
Patient states he had an EKG performed at [facility] ED and has some questions regarding it. Please call to discuss.

## 2018-05-11 NOTE — Telephone Encounter (Signed)
Per review of EMR: Carmen Faith, PA-C instructed her to stop the amlodipine and Coreg and started her on Bystolic 5 mg daily with instructions to increase to 10 mg daily if tolerated and blood pressure is above 567 systolic. Please instruct her start taking the prescribed Bystolic by Carmen Faith, PA-C.  Did she fill this medication?    It sounds as if there is some baseline confusion regarding her conditions and medical plan. Per review of Carmen Grimes's note, he had advised her to discontinue these medications due to fatigue (possibly 2/2 Coreg) and LEE (possibly 2/2 to amlodipine).  It sounds as if she has never stopped the medications and/or is confused regarding the reason she stopped them.  We will need to ensure that knows to stop Coreg and amlodipine, as she should not take these medications at the same time as Bystolic.  Please encourage her to record daily heart rate and blood pressure readings, if she has a blood pressure cuff at home.   Per review of EMR, she was seen by Carmen Grimes with recommendation for outpatient echo, stress test, and cardiac CT through his office.  Discharge summary indicates that the stress test was normal and she was seen as low risk.  It was felt that her chest pain was atypical in the setting of thoracic mass but that she follow-up with Carmen Grimes pulmonology and oncology. Per review of previous cardiology notes, medical management was recommended to reduce her risk of heart disease with a statin therapy, Crestor 20 mg daily, for elevated LDL/lipids. She did not wish to take this medication.  If she wishes to start statin therapy, we can certainly obtain the labs needed and prescribe that medication for her.  Aggressive risk factor modification is advised at this time.  Left ventricular hypertrophy is often seen with uncontrolled blood pressure, and is a very common finding on an EKG.  This is the reason for aggressive risk factor modifications, such as controlling her blood pressure with the  Bystolic.  Based on the blood pressure readings documented in today's note, it sounds as if her blood pressure and heart rate are well controlled at this time.  It sounds as if she is very anxious.  Is she scheduled for follow-up with Carmen Grimes or an app?  This may help her anxiety.

## 2018-05-11 NOTE — Telephone Encounter (Signed)
I spoke with the patient. She called today with concerns of an EKG from [place] on 5/3 stating she has left ventricular hypertrophy/ abnormal EKG.  Patient seen in the ER at [facility] on 5/2 after being started on prednisone for an asthma attack on 5/1. CC- HTN Chest x-ray done & per the ER MD:  "Her CXR was concerning for left lower lobe airspace disease.  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  She denies fever.  I will repeat labs at this time and will start her on antibiotics due to elevated white count of 23 seen yesterday"  "White count is elevated but trending down from 23 yesterday to 19.  Chest x-ray shows progression of left lower lobe infiltrate.  Lactic is normal, no temp, no tachycardia, no tachypnea, no hypoxia, therefore no evidence of sepsis.  Patient was given Rocephin and azithromycin.  Should be discharged home on Augmentin and azithromycin.  Recommended taking 40 mg of prednisone daily.  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  Patient seen in the ER at [facility] on 5/5  - seen for tachycardia- HR in the 140's per her report - she had taken prednisone & an albuterol inhaler - she was monitored on telemetry for an hour and her HR came back down to 77.   The patient's main concern today is the readings from her EKG at [facility] w/ left ventricular hypertrophy/ abnormal EKG. I advised the patient we cannot see these tracings from [place]. She is asking if Thurmond Butts can look at her EKG readings from 5/2 & 5/5 at [facility] and give his interpretation on those readings.  The patient is concerned she is going to  have a heart attack. I advised her this should be low risk. She has been confused as on hospital system said she had pneumonia and the other didn't. She states she is completely assymptomatic at this time. She had been on coreg 12.5 mg BID & amlodipine 5 mg daily prior to her surgery. She states she was told these would be short term meds as the mass in her lung was secreting a horomone close to her heart causing her to have elevated HR's/ BP's. She took her last dose of both these meds on 4/19- BP (HR) at home have been 107-123/60-70 (60-70) up until she started prednisone.  She did not tell me directly she had been on an inhaler as well. She decided to go back on coreg 12.5 mg BID & amlodipine 5 mg QD until her last dose of prednisone was completed today.   Her chart states she is on bystolic- she denis this- I will remove from her med list.  She is aware I will forward this message to Thurmond Butts to ask him to look at her EKG's from [facility] on 5/2 & 5/5. She is aware if she wants him to review the EKG from [place] on 5/3 she will need to call [facility] and have this fax this to [place].  Fax # given.   The patient is doing well today- no  chest pain / sob / fever/ tachycardia/ hypertension. BP (HR)- last night at 8:50 am- 108/65 (70) & this morning at 5:06 am 123/76 (72).  She is aware we will call her back once reviewed by Thurmond Butts, [place]. She voices understanding and is agreeable.

## 2018-05-11 NOTE — Telephone Encounter (Signed)
Carmen Grimes,  She did stop coreg & amlodipine mid April- only restarted them with her tachycardia/ for the duration of prednisone. She is not taking bystolic, I asked her directly.  Since her lobectomy and off coreg/ amlodipine, her pressures are running, 107-123/60-70 & HR 60-70's (off meds).   She is not due to see Dr. Rockey Situ until August.

## 2018-05-11 NOTE — Telephone Encounter (Signed)
Does she have any additional concerns?  Or, is she comfortable with waiting until her next visit.

## 2018-05-12 NOTE — Telephone Encounter (Signed)
Returned call to patient.   She reports that yesterday was the last day she took prednisone. She has not been taking bystolic, she says the sx of fatigue were unbearable. She has been taking amlodipine and coreg that she had left over. Today BP 128/81 HR 76 upon waking.   Pt reports PCP is following cholesterol. She felt it was high d/t taking taking coconut oil in the past.   Pt is set for recall in August.   Routed to provider as Juluis Rainier.

## 2018-05-12 NOTE — Telephone Encounter (Signed)
Agree with Carmen Grimes's recommendations. Her elevated BP and tachycardia were likely in the setting of prednisone and albuterol. Since stopping those medications, it appears both her heart rates and BP have improved.

## 2018-06-07 ENCOUNTER — Other Ambulatory Visit: Payer: Self-pay

## 2018-06-07 ENCOUNTER — Encounter: Payer: Self-pay | Admitting: Emergency Medicine

## 2018-06-07 ENCOUNTER — Emergency Department: Payer: Medicare Other

## 2018-06-07 ENCOUNTER — Emergency Department
Admission: EM | Admit: 2018-06-07 | Discharge: 2018-06-08 | Disposition: A | Discharge status: Home / Self Care | Payer: Medicare Other | Attending: Emergency Medicine | Admitting: Emergency Medicine

## 2018-06-07 DIAGNOSIS — R030 Elevated blood-pressure reading, without diagnosis of hypertension: Secondary | ICD-10-CM | POA: Diagnosis present

## 2018-06-07 DIAGNOSIS — Z91040 Latex allergy status: Secondary | ICD-10-CM | POA: Diagnosis not present

## 2018-06-07 DIAGNOSIS — J45909 Unspecified asthma, uncomplicated: Secondary | ICD-10-CM | POA: Insufficient documentation

## 2018-06-07 DIAGNOSIS — Z87891 Personal history of nicotine dependence: Secondary | ICD-10-CM | POA: Diagnosis not present

## 2018-06-07 DIAGNOSIS — Z79899 Other long term (current) drug therapy: Secondary | ICD-10-CM | POA: Diagnosis not present

## 2018-06-07 DIAGNOSIS — R079 Chest pain, unspecified: Secondary | ICD-10-CM | POA: Diagnosis not present

## 2018-06-07 DIAGNOSIS — I10 Essential (primary) hypertension: Secondary | ICD-10-CM | POA: Diagnosis not present

## 2018-06-07 LAB — BASIC METABOLIC PANEL
Anion gap: 10 (ref 5–15)
BUN: 7 mg/dL (ref 6–20)
CO2: 23 mmol/L (ref 22–32)
Calcium: 9.1 mg/dL (ref 8.9–10.3)
Chloride: 102 mmol/L (ref 98–111)
Creatinine, Ser: 0.69 mg/dL (ref 0.44–1.00)
GFR calc Af Amer: >60 mL/min (ref >60)
GFR calc non Af Amer: >60 mL/min (ref >60)
Glucose, Bld: 89 mg/dL (ref 70–99)
Potassium: 4.1 mmol/L (ref 3.5–5.1)
Sodium: 135 mmol/L (ref 135–145)

## 2018-06-07 LAB — TROPONIN I
Troponin I: <0.03 ng/mL (ref <0.03)
Troponin I: <0.03 ng/mL (ref <0.03)

## 2018-06-07 LAB — CBC
HCT: 37.2 % (ref 36.0–46.0)
Hemoglobin: 12.3 g/dL (ref 12.0–15.0)
MCH: 30.2 pg (ref 26.0–34.0)
MCHC: 33.1 g/dL (ref 30.0–36.0)
MCV: 91.4 fL (ref 80.0–100.0)
Platelets: 338 10*3/uL (ref 150–400)
RBC: 4.07 MIL/uL (ref 3.87–5.11)
RDW: 12.9 % (ref 11.5–15.5)
WBC: 8.6 10*3/uL (ref 4.0–10.5)
nRBC: 0 % (ref 0.0–0.2)

## 2018-06-07 LAB — FIBRIN DERIVATIVES D-DIMER (ARMC ONLY): Fibrin derivatives D-dimer (ARMC): 419.93 ng/mL (FEU) (ref 0.00–499.00)

## 2018-06-07 NOTE — ED Triage Notes (Signed)
Pt presents to ED via POV c/o mid-sternal chest pain 4/10 starting today. Pt states she took her vitals at home and had BP 130/80 and HR120s. Pt directed by home health nurse to take her labetalol which reduced HR to 101.

## 2018-06-07 NOTE — ED Provider Notes (Signed)
[facility] Emergency Department Provider Note  ____________________________________________   I have reviewed the triage vital signs and the nursing notes.   HISTORY  Chief Complaint Chest Pain   History limited by: Not Limited   HPI Carmen Grimes is a 58 y.o. female who presents to the emergency department today because of concerns for high blood pressure and fast heart rate.  Patient states that she had a lobectomy in March.  Since that time she has occasional chest discomfort.  Located primarily in the right chest.  She had this discomfort again today and then noticed that her blood pressure was elevated.  She also noted a fast heart rate.  She tried taking a blood pressure medication without any significant improvement of her symptoms. Denies any fevers.   Records reviewed. Per medical record review patient has a history of asthma, dyspnea.   Past Medical History:  Diagnosis Date  . Asthma    controlled  . Dyspnea 12/2017   difficulty getting her breath at all times  . GERD (gastroesophageal reflux disease)   . H/O: GI bleed   . Hypertension   . Lung mass   . Mitral valve prolapse   . Osteoarthritis    knees    Patient Active Problem List   Diagnosis Date Noted  . H/O chest tube placement   . Pneumothorax 12/12/2017  . Lung nodule   . Degeneration of lumbar intervertebral disc 12/05/2017  . Cervical spondylosis without myelopathy 06/26/2017  . 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 Synovitis of right hand 02/01/2017  . H/O: GI bleed   . Osteoarthritis   . GERD (gastroesophageal reflux disease)   . Asthma   . Vitamin D deficiency 07/16/2016  . Anxiety 07/12/2016  . Acute pain of left shoulder 03/17/2016  . Impingement syndrome of left shoulder 03/17/2016  . Right hip pain 12/19/2015  . Primary osteoarthritis of right knee 12/05/2015  . Lichen planus 70/26/3785  . Chest pain 01/13/2015  . Left forearm pain 06/08/2014  .  Acute bilateral low back pain with bilateral sciatica 05/16/2014  . Breast pain, left 05/16/2014  . Cervical radiculopathy 05/16/2014  . Abnormal weight gain 02/22/2014  . Cervical pain (neck) 02/22/2014  . Fatigue 02/22/2014  . Hair loss 02/22/2014  . Muscle spasm 02/22/2014  . Mild intermittent asthma 11/12/2013    Past Surgical History:  Procedure Laterality Date  . ABDOMINAL HYSTERECTOMY  2002  . ANTERIOR FUSION CERVICAL SPINE  2009  . COLONOSCOPY  2013   normal  . ELECTROMAGNETIC NAVIGATION BROCHOSCOPY Left 12/12/2017   Procedure: ELECTROMAGNETIC NAVIGATION BRONCHOSCOPY;  Surgeon: Flora Lipps, MD;  Location: [facility] ORS;  Service: Cardiopulmonary;  Laterality: Left;  . ESOPHAGOGASTRODUODENOSCOPY  1990   GI bleed  . FLEXIBLE BRONCHOSCOPY Left 12/12/2017   Procedure: FLEXIBLE BRONCHOSCOPY;  Surgeon: Flora Lipps, MD;  Location: [facility] ORS;  Service: Cardiopulmonary;  Laterality: Left;  . KNEE ARTHROSCOPY Right 2010   x3  . KNEE SURGERY Left    Arthroscopy x1  . LAPAROSCOPIC SUPRACERVICAL HYSTERECTOMY  2002   with BSO for fibroids/ AUB/ pelvic pain  . LAPAROSCOPIC TUBAL LIGATION  1982  . SHOULDER SURGERY Right    rotator cuff repair  . SHOULDER SURGERY Right 2011   second rotator cuff repair  . tubal reanastamosis  1985    Prior to Admission medications   Medication Sig Start Date End Date Taking? Authorizing Provider  azithromycin (ZITHROMAX) 250 MG tablet Take 1 a day for 4 days 05/06/18  Alfred Levins, [place], MD  Biotin 10000 MCG TABS Take 1 tablet by mouth daily.    [provider]  chlorpheniramine-HYDROcodone (TUSSIONEX PENNKINETIC ER) 10-8 MG/5ML SUER Take 5 mLs by mouth every 12 (twelve) hours as needed for cough.    [provider]  estradiol (ESTRACE) 2 MG tablet Take 1 tablet (2 mg total) by mouth daily. 04/03/18   Dalia Heading, CNM  Multiple Vitamin (MULTI VITAMIN DAILY PO) Take 1 tablet by mouth daily.     [provider]  nebivolol  (BYSTOLIC) 5 MG tablet Take 1 tablet (5 mg total) by mouth daily. Can increase to 10 mg daily if systolic blood pressure is greater than 130. 04/24/18   Dunn, Ryan M, PA-C  VENTOLIN HFA 108 (90 Base) MCG/ACT inhaler Inhale 2 puffs into the lungs every 6 (six) hours as needed for wheezing or shortness of breath.  11/14/17   [provider]    Allergies Shellfish allergy; Tramadol; Celecoxib; Gabapentin; Hydrocodone; Hydrocodone-acetaminophen; Ibuprofen; Naproxen; Prednisone; Singulair [montelukast]; Strawberry extract; Codeine; Doxycycline; Eggs or egg-derived products; Latex; Nsaids; Strawberry flavor; and Sulfamethoxazole-trimethoprim  Family History  Problem Relation Age of Onset  . Diabetes Mother   . Dementia Mother        Alzheimers  . Hypertension Mother   . Glaucoma Mother   . Prostate cancer Father 45  . Stroke Father   . Arthritis Father   . Osteoporosis Father   . Prostate cancer Brother   . Breast cancer Neg Hx     Social History Social History   Tobacco Use  . Smoking status: Former Smoker    Packs/day: 1.00    Years: 13.00    Pack years: 13.00  . Smokeless tobacco: Never Used  Substance Use Topics  . Alcohol use: Not Currently  . Drug use: No    Review of Systems Constitutional: No fever/chills Eyes: No visual changes. ENT: No sore throat. Cardiovascular: Positive for chest pain. Positive for fast heart rate.  Respiratory: Denies shortness of breath. Gastrointestinal: No abdominal pain.  No nausea, no vomiting.  No diarrhea.   Genitourinary: Negative for dysuria. Musculoskeletal: Negative for back pain. Skin: Negative for rash. Neurological: Negative for headaches, focal weakness or numbness.  ____________________________________________   PHYSICAL EXAM:  VITAL SIGNS: ED Triage Vitals  Enc Vitals Group     BP 06/07/18 1905 (!) 148/89     Pulse Rate 06/07/18 1905 82     Resp 06/07/18 1905 16     Temp 06/07/18 1905 98.2 F (36.8 C)      Temp Source 06/07/18 1905 Oral     SpO2 06/07/18 1905 99 %     Weight 06/07/18 1903 140 lb (63.5 kg)     Height 06/07/18 1903 _0  (1.549 m)     Head Circumference --      Peak Flow --      Pain Score 06/07/18 1902 4   Constitutional: Alert and oriented.  Eyes: Conjunctivae are normal.  ENT      Head: Normocephalic and atraumatic.      Nose: No congestion/rhinnorhea.      Mouth/Throat: Mucous membranes are moist.      Neck: No stridor. Hematological/Lymphatic/Immunilogical: No cervical lymphadenopathy. Cardiovascular: Normal rate, regular rhythm.  No murmurs, rubs, or gallops. Respiratory: Normal respiratory effort without tachypnea nor retractions. Breath sounds are clear and equal bilaterally. No wheezes/rales/rhonchi. Gastrointestinal: Soft and non tender. No rebound. No guarding.  Genitourinary: Deferred Musculoskeletal: Normal range of motion in all extremities. No  lower extremity edema. Neurologic:  Normal speech and language. No gross focal neurologic deficits are appreciated.  Skin:  Skin is warm, dry and intact. No rash noted. Psychiatric: Mood and affect are normal. Speech and behavior are normal. Patient exhibits appropriate insight and judgment.  ____________________________________________    LABS (pertinent positives/negatives)  Trop <0.03 BMP wnl CBC wbc 8.6, hgb 12.3, plt 338  ____________________________________________   EKG  I, Nance Pear, attending physician, personally viewed and interpreted this EKG  EKG Time: 1904 Rate: 93 Rhythm: normal sinus rhythm Axis: normal Intervals: qtc 422 QRS: narrow ST changes: no st elevation Impression: normal ekg ____________________________________________    RADIOLOGY  CXR No acute abnormality  ____________________________________________   PROCEDURES  Procedures  ____________________________________________   INITIAL IMPRESSION / ASSESSMENT AND PLAN / ED COURSE  Pertinent labs & imaging  results that were available during my care of the patient were reviewed by me and considered in my medical decision making (see chart for details).   Patient presented to the emergency department today because of concern for fast heart rate, high blood pressure and chest discomfort. ddx would be broad including anxiety, acs, pe, ptx, pna amongst other etiologies. Troponin negative x 2. Awaiting d-dimer at time of signout.   ____________________________________________   FINAL CLINICAL IMPRESSION(S) / ED DIAGNOSES  Final diagnoses:  Chest pain, unspecified type     Note: This dictation was prepared with Dragon dictation. Any transcriptional errors that result from this process are unintentional     Nance Pear, MD 06/08/18 1537

## 2018-06-07 NOTE — ED Notes (Signed)
Dr. Archie Balboa at the bedside for pt evaluation

## 2018-06-07 NOTE — ED Provider Notes (Signed)
-----------------------------------------   11:43 PM on 06/07/2018 -----------------------------------------   Blood pressure 121/78, pulse 67, temperature 98.2 F (36.8 C), temperature source Oral, resp. rate (!) 9, height 5\' 1"  (1.549 m), weight 63.5 kg, SpO2 100 %.  Assuming care from Dr. Archie Balboa of Carmen Grimes is a 58 y.o. female with a chief complaint of Chest Pain .    Please refer to H&P by previous MD for further details.  The current plan of care is to f/u d-dimer, get CT if dimer is elevated or dc home if normal.  D-dimer is WNL. Troponin x 2 WNL. EKG non ischemic. CXR with no evidence of pulmonary edema, pneumothorax, pneumonia.  At this time will discharge home with close follow-up with primary care doctor.  Discussed my standard return precautions.        Alfred Levins, [place], MD 06/07/18 [id number]

## 2018-06-07 NOTE — Discharge Instructions (Addendum)

## 2018-06-08 ENCOUNTER — Telehealth: Payer: Self-pay | Admitting: Physician Assistant

## 2018-06-08 NOTE — ED Notes (Signed)
Patient discharged to home per MD order. Patient in stable condition, and deemed medically cleared by ED provider for discharge. Discharge instructions reviewed with patient/family using "Teach Back"; verbalized understanding of medication education and administration, and information about follow-up care. Denies further concerns. ° °

## 2018-06-08 NOTE — Telephone Encounter (Signed)
Virtual Visit Pre-Appointment Phone Call  "(Name), I am calling you today to discuss your upcoming appointment. We are currently trying to limit exposure to the virus that causes COVID-19 by seeing patients at home rather than in the office."  1. "What is the BEST phone number to call the day of the visit?" - include this in appointment notes  2. Do you have or have access to (through a family member/friend) a smartphone with video capability that we can use for your visit?" a. If yes - list this number in appt notes as cell (if different from BEST phone #) and list the appointment type as a VIDEO visit in appointment notes b. If no - list the appointment type as a PHONE visit in appointment notes  3. Confirm consent - "In the setting of the current Covid19 crisis, you are scheduled for a (phone or video) visit with your provider on (date) at (time).  Just as we do with many in-office visits, in order for you to participate in this visit, we must obtain consent.  If you'd like, I can send this to your mychart (if signed up) or email for you to review.  Otherwise, I can obtain your verbal consent now.  All virtual visits are billed to your insurance company just like a normal visit would be.  By agreeing to a virtual visit, we'd like you to understand that the technology does not allow for your provider to perform an examination, and thus may limit your provider's ability to fully assess your condition. If your provider identifies any concerns that need to be evaluated in person, we will make arrangements to do so.  Finally, though the technology is pretty good, we cannot assure that it will always work on either your or our end, and in the setting of a video visit, we may have to convert it to a phone-only visit.  In either situation, we cannot ensure that we have a secure connection.  Are you willing to proceed?" STAFF: Did the patient verbally acknowledge consent to telehealth visit? Document  YES/NO here: YES  4. Advise patient to be prepared - "Two hours prior to your appointment, go ahead and check your blood pressure, pulse, oxygen saturation, and your weight (if you have the equipment to check those) and write them all down. When your visit starts, your provider will ask you for this information. If you have an Apple Watch or Kardia device, please plan to have heart rate information ready on the day of your appointment. Please have a pen and paper handy nearby the day of the visit as well."  5. Give patient instructions for MyChart download to smartphone OR Doximity/[email or web address] as below if video visit (depending on what platform provider is using)  6. Inform patient they will receive a phone call 15 minutes prior to their appointment time (may be from unknown caller ID) so they should be prepared to answer    TELEPHONE CALL NOTE  Carmen Grimes has been deemed a candidate for a follow-up tele-health visit to limit community exposure during the Covid-19 pandemic. I spoke with the patient via phone to ensure availability of phone/video source, confirm preferred email & phone number, and discuss instructions and expectations.  I reminded Carmen Grimes to be prepared with any vital sign and/or heart rhythm information that could potentially be obtained via home monitoring, at the time of her visit. I reminded Carmen Grimes to expect a phone call prior to her visit.  [name] 06/08/2018 8:38 AM   INSTRUCTIONS FOR DOWNLOADING THE MYCHART APP TO SMARTPHONE  - The patient must first make sure to have activated MyChart and know their login information - If Apple, go to [employer] and type in MyChart in the search bar and download the app. If Android, ask patient to go to [employer] and type in [place] in the search bar and download the app. The app is free but as with any other app downloads, their phone may require them to verify saved payment information or Apple/Android  password.  - The patient will need to then log into the app with their MyChart username and password, and select [facility] as their healthcare provider to link the account. When it is time for your visit, go to the MyChart app, find appointments, and click Begin Video Visit. Be sure to Select Allow for your device to access the Microphone and Camera for your visit. You will then be connected, and your provider will be with you shortly.  **If they have any issues connecting, or need assistance please contact MyChart service desk [phone])**  **If using a computer, in order to ensure the best quality for their visit they will need to use either of the following Internet Browsers: [employer], or Google Chrome**  IF USING DOXIMITY or DOXY.ME - The patient will receive a link just prior to their visit by text.     FULL LENGTH CONSENT FOR TELE-HEALTH VISIT   I hereby voluntarily request, consent and authorize [facility] and its employed or contracted physicians, physician assistants, nurse practitioners or other licensed health care professionals (the Practitioner), to provide me with telemedicine health care services (the Services") as deemed necessary by the treating Practitioner. I acknowledge and consent to receive the Services by the Practitioner via telemedicine. I understand that the telemedicine visit will involve communicating with the Practitioner through live audiovisual communication technology and the disclosure of certain medical information by electronic transmission. I acknowledge that I have been given the opportunity to request an in-person assessment or other available alternative prior to the telemedicine visit and am voluntarily participating in the telemedicine visit.  I understand that I have the right to withhold or withdraw my consent to the use of telemedicine in the course of my care at any time, without affecting my right to future care or treatment,  and that the Practitioner or I may terminate the telemedicine visit at any time. I understand that I have the right to inspect all information obtained and/or recorded in the course of the telemedicine visit and may receive copies of available information for a reasonable fee.  I understand that some of the potential risks of receiving the Services via telemedicine include:   Delay or interruption in medical evaluation due to technological equipment failure or disruption;  Information transmitted may not be sufficient (e.g. poor resolution of images) to allow for appropriate medical decision making by the Practitioner; and/or   In rare instances, security protocols could fail, causing a breach of personal health information.  Furthermore, I acknowledge that it is my responsibility to provide information about my medical history, conditions and care that is complete and accurate to the best of my ability. I acknowledge that Practitioner's advice, recommendations, and/or decision may be based on factors not within their control, such as incomplete or inaccurate data provided by me or distortions of diagnostic images or specimens that may result from electronic transmissions. I understand that the practice  of medicine is not an Chief Strategy Officer and that Practitioner makes no warranties or guarantees regarding treatment outcomes. I acknowledge that I will receive a copy of this consent concurrently upon execution via email to the email address I last provided but may also request a printed copy by calling the office of [facility].    I understand that my insurance will be billed for this visit.   I have read or had this consent read to me.  I understand the contents of this consent, which adequately explains the benefits and risks of the Services being provided via telemedicine.   I have been provided ample opportunity to ask questions regarding this consent and the Services and have had my questions  answered to my satisfaction.  I give my informed consent for the services to be provided through the use of telemedicine in my medical care  By participating in this telemedicine visit I agree to the above.

## 2018-06-09 ENCOUNTER — Other Ambulatory Visit: Payer: Self-pay

## 2018-06-09 ENCOUNTER — Other Ambulatory Visit: Payer: Self-pay | Admitting: Family

## 2018-06-09 ENCOUNTER — Telehealth (INDEPENDENT_AMBULATORY_CARE_PROVIDER_SITE_OTHER): Payer: Medicare Other | Admitting: Physician Assistant

## 2018-06-09 ENCOUNTER — Encounter: Payer: Self-pay | Admitting: Physician Assistant

## 2018-06-09 ENCOUNTER — Telehealth: Payer: Self-pay

## 2018-06-09 VITALS — BP 123/73 | HR 80 | Ht 61.0 in | Wt 143.4 lb

## 2018-06-09 DIAGNOSIS — R232 Flushing: Secondary | ICD-10-CM

## 2018-06-09 DIAGNOSIS — R531 Weakness: Secondary | ICD-10-CM

## 2018-06-09 DIAGNOSIS — I340 Nonrheumatic mitral (valve) insufficiency: Secondary | ICD-10-CM

## 2018-06-09 DIAGNOSIS — R0789 Other chest pain: Secondary | ICD-10-CM | POA: Diagnosis not present

## 2018-06-09 DIAGNOSIS — R5383 Other fatigue: Secondary | ICD-10-CM

## 2018-06-09 DIAGNOSIS — R519 Headache, unspecified: Secondary | ICD-10-CM

## 2018-06-09 DIAGNOSIS — R002 Palpitations: Secondary | ICD-10-CM

## 2018-06-09 DIAGNOSIS — F419 Anxiety disorder, unspecified: Secondary | ICD-10-CM

## 2018-06-09 DIAGNOSIS — I10 Essential (primary) hypertension: Secondary | ICD-10-CM

## 2018-06-09 DIAGNOSIS — R079 Chest pain, unspecified: Secondary | ICD-10-CM

## 2018-06-09 NOTE — Telephone Encounter (Signed)
Call to patient to review AVS. Zio and myoview instructions reviewed.   Pt verbalized understanding and will be expecting a call from scheduling.   Advised pt to call for any further questions or concerns.   AVS sent via mychart.

## 2018-06-09 NOTE — Progress Notes (Signed)
Virtual Visit via Telephone Note   This visit type was conducted due to national recommendations for restrictions regarding the COVID-19 Pandemic (e.g. social distancing) in an effort to limit this patient's exposure and mitigate transmission in our community.  Due to her co-morbid illnesses, this patient is at least at moderate risk for complications without adequate follow up.  This format is felt to be most appropriate for this patient at this time.  The patient did not have access to video technology/had technical difficulties with video requiring transitioning to audio format only (telephone).  All issues noted in this document were discussed and addressed.  No physical exam could be performed with this format.  Please refer to the patient's chart for her  consent to telehealth for [facility].   Date:  06/09/2018   ID:  Carmen Grimes, DOB Mar 12, 1960, MRN [id number]  Patient Location: Home Provider Location: Home  PCP:  Perrin Maltese, MD  Cardiologist:  Ida Rogue, MD  Electrophysiologist:  None   Evaluation Performed:  Follow-Up Visit  Chief Complaint:  ED Follow up   History of Present Illness:    Carmen Grimes is a 58 y.o. female with history of necrotizing granuloma s/p left wedge resection x 2 at [facility] in 03/2018, chronic atypical chest pain with frequent ED visits, tobacco abuse for 18 years, prior GI bleed, mitral valve prolapse, COPD with asthma, pulmonary nodule, hypertension, hyperlipidemia, left shoulder impingement, osteoarthritis, and anxiety who presents for ED follow up of chest pain.   Prior remote stress test in 2017 for atypical chest pain was negative with recommendation for treatment of reflux or gallbladder disease.  In 10/2017 she developed shortness of breath, productive cough, chills, sweats, and fatigue with 3 courses of antibiotics not improving symptoms.  Follow-up chest x-ray revealed a mass in the lingula worrisome for malignancy.  Follow-up PET scan  showed hypermetabolic lingular opacity.  Since then, she has been seen by both pulmonology and oncology with navigational bronchoscopy being performed in early 12/2017 with pathology indicating a benign sample.  This was followed by multiple visits to the [facility] ED for chest pain described as intermittent pressure, at times sharp with each episode lasting for several seconds.  In early 01/2018 CTA of the chest at [facility] was negative for PE with a 3.2 x 1.7 cm soft tissue mass adjacent to the pericardium by the LV concerning for malignancy.  In late 01/2018 she presented to the [facility] ED for recurrent intermittent chest pain.  She was seen by Dr. Humphrey Rolls and recommended outpatient echo, stress test, and cardiac CT through his office.  Discharge summary indicates the stress test was normal.  Echo in 01/2018 by outside cardiology office showed an EF of 69%, moderate TR, mild to moderate MR, PASP 32 mmHg. Her chest pain was felt to be atypical and in the setting of thoracic mass.  It was recommended she follow-up with [facility] pulmonology/oncology. Outpatient cardiac monitoring for palpitations in 03/2018 showed NSR with an average heart rate of 74 bpm, 2 runs of NSVT with the fastest being 9 beats with a maximum rate of 174 bpm and the longest lasting 11 beats with an average rate of 105 bpm, 2 runs of SVT occurred with the fastest interval lasating 9 beats with a maximum rate of 167 bpm. She has since undergone left wedge resection x 2 in 03/2018 at [facility] with diagnosis of her lung mass found to be necrotizing granuloma. Following this, she has continued to have frequent ED  visits for various complaints including dizziness, presyncope, chest pain, and asthma. She has had issues with elevated BP and tachypalpitations this spring in the setting of prednisone usage for asthma exacerbations.   She was most recently seen in the ED of 06/07/2018 with chest pain. D-dimer negative, troponin negative x 2, HGB 12.3, K+ 4.1, SCr 0.69, EKG NSR, 93  bpm, nonspecific inferior st/t changes improved from prior, CXR without acute cardiopulmonary process. Outpatient follow up with her PCP was recommended.   She is doing well from a cardiac perspective today. She began to notice bilateral cheek flushing on 5/30 with associated mildly elevated BP. Prior to this, she was not taking any antihypertensive medication as her BP was running in the low 035D systolic. She has also continued to note sharp chest discomfort since her surgery. She states she can just lightly touch her sternum and have sharp chest pain. There is no exertional component or associated SOB, dizziness, presyncope, or syncope. In the setting of her cheek flushing she checked her heart rate and reported this to be 124 bpm. When she presented to the ED on 6/3 for cheek flushing and palpitations she was noted to be in sinus rhythm without any documentation of arrhythmia. Work up was normal as above. Currently, very anxious.   The patient does not have symptoms concerning for COVID-19 infection (fever, chills, cough, or new shortness of breath).    Past Medical History:  Diagnosis Date  . Asthma    controlled  . Dyspnea 12/2017   difficulty getting her breath at all times  . GERD (gastroesophageal reflux disease)   . H/O: GI bleed   . Hypertension   . Lung mass   . Mitral valve prolapse   . Osteoarthritis    knees   Past Surgical History:  Procedure Laterality Date  . ABDOMINAL HYSTERECTOMY  2002  . ANTERIOR FUSION CERVICAL SPINE  2009  . COLONOSCOPY  2013   normal  . ELECTROMAGNETIC NAVIGATION BROCHOSCOPY Left 12/12/2017   Procedure: ELECTROMAGNETIC NAVIGATION BRONCHOSCOPY;  Surgeon: Flora Lipps, MD;  Location: [facility] ORS;  Service: Cardiopulmonary;  Laterality: Left;  . ESOPHAGOGASTRODUODENOSCOPY  1990   GI bleed  . FLEXIBLE BRONCHOSCOPY Left 12/12/2017   Procedure: FLEXIBLE BRONCHOSCOPY;  Surgeon: Flora Lipps, MD;  Location: [facility] ORS;  Service: Cardiopulmonary;  Laterality:  Left;  . KNEE ARTHROSCOPY Right 2010   x3  . KNEE SURGERY Left    Arthroscopy x1  . LAPAROSCOPIC SUPRACERVICAL HYSTERECTOMY  2002   with BSO for fibroids/ AUB/ pelvic pain  . LAPAROSCOPIC TUBAL LIGATION  1982  . SHOULDER SURGERY Right    rotator cuff repair  . SHOULDER SURGERY Right 2011   second rotator cuff repair  . tubal reanastamosis  1985     Current Meds  Medication Sig  . amLODipine (NORVASC) 5 MG tablet Take 5 mg by mouth daily.  . Ascorbic Acid (VITAMIN C) 1000 MG tablet Take 1,000 mg by mouth daily.  Marland Kitchen b complex vitamins capsule Take 1 capsule by mouth daily.  . carvedilol (COREG) 12.5 MG tablet Take 12.5 mg by mouth daily.  . cholecalciferol (VITAMIN D3) 25 MCG (1000 UT) tablet Take 5,000 Units by mouth daily.  Marland Kitchen estradiol (ESTRACE) 2 MG tablet Take 1 tablet (2 mg total) by mouth daily.  Marland Kitchen LECITHIN PO Take by mouth every other day.  . oxyCODONE (OXY IR/ROXICODONE) 5 MG immediate release tablet Take 5 mg by mouth as needed.  . VENTOLIN HFA 108 (90 Base) MCG/ACT inhaler  Inhale 2 puffs into the lungs every 6 (six) hours as needed for wheezing or shortness of breath.   . zinc gluconate 50 MG tablet Take 50 mg by mouth daily.     Allergies:   Shellfish allergy; Tape; Tramadol; Celecoxib; Gabapentin; Hydrocodone; Hydrocodone-acetaminophen; Ibuprofen; Naproxen; Prednisone; Singulair [montelukast]; Strawberry extract; Codeine; Doxycycline; Eggs or egg-derived products; Latex; Nsaids; Strawberry flavor; and Sulfamethoxazole-trimethoprim   Social History   Tobacco Use  . Smoking status: Former Smoker    Packs/day: 1.00    Years: 13.00    Pack years: 13.00  . Smokeless tobacco: Never Used  Substance Use Topics  . Alcohol use: Not Currently  . Drug use: No     Family Hx: The patient's family history includes Arthritis in her father; Dementia in her mother; Diabetes in her mother; Glaucoma in her mother; Hypertension in her mother; Osteoporosis in her father; Prostate  cancer in her brother; Prostate cancer (age of onset: 71) in her father; Stroke in her father. There is no history of Breast cancer.  ROS:   Please see the history of present illness.     All other systems reviewed and are negative.   Prior CV studies:   The following studies were reviewed today:  As above  Labs/Other Tests and Data Reviewed:    EKG:  An ECG dated 06/07/2018 was personally reviewed today and demonstrated:  NSR, 93 bpm, nonspecific inferior st/t changes improved from prior  Recent Labs: 02/03/2018: TSH 1.543 05/04/2018: B Natriuretic Peptide 41.0 05/05/2018: ALT 12 06/07/2018: BUN 7; Creatinine, Ser 0.69; Hemoglobin 12.3; Platelets 338; Potassium 4.1; Sodium 135   Recent Lipid Panel Lab Results  Component Value Date/Time   CHOL 287 (H) 02/03/2018 02:10 PM   TRIG 329 (H) 02/03/2018 02:10 PM   HDL 85 02/03/2018 02:10 PM   CHOLHDL 3.4 02/03/2018 02:10 PM   LDLCALC 136 (H) 02/03/2018 02:10 PM    Wt Readings from Last 3 Encounters:  06/09/18 143 lb 6 oz (65 kg)  06/07/18 140 lb (63.5 kg)  05/09/18 147 lb (66.7 kg)     Objective:    Vital Signs:  BP 123/73 (BP Location: Left Arm, Patient Position: Supine)   Pulse 80   Ht _0  (1.549 m)   Wt 143 lb 6 oz (65 kg)   BMI 27.09 kg/m    VITAL SIGNS:  reviewed  ASSESSMENT & PLAN:    1. Chronic atypical chest pain: Pain is completely reproducible to light touch by the patient. Prior ischemic workup has bene negative. Schedule Lexiscan Myoview, if normal recommend she follow up with her PCP for noncardiac chest pain. Aggressive risk factor modification and primary prevention.   2. Palpitations/cheek flushing: Uncertain if this is related to her prior thoracic mass or not. Schedule Zio monitor to evaluate for significant arrhythmia/ectopy. Stress test as above. Start bid dosing of Coreg. Recent TSH normal.   3. Mitral regurgitation: Mild to moderate on echo from 01/2018. Follow up in 01/2019.   4. Anxiety: Likely  playing a significant role in the above. Follow up with PCP.   5. HTN: She has only been taking Coreg once daily rather than twice daily. This is what has likely led to her mildly elevated PM readings. Stop amlodipine and take Coreg 12.5 mg bid.   COVID-19 Education: The signs and symptoms of COVID-19 were discussed with the patient and how to seek care for testing (follow up with PCP or arrange E-visit).  The importance of social distancing was discussed  today.  Time:   Today, I have spent 17 minutes with the patient with telehealth technology discussing the above problems.     Medication Adjustments/Labs and Tests Ordered: Current medicines are reviewed at length with the patient today.  Concerns regarding medicines are outlined above.   Tests Ordered: No orders of the defined types were placed in this encounter.   Medication Changes: No orders of the defined types were placed in this encounter.   Disposition:  Follow up in 3 month(s)  Signed, Christell Faith, PA-C  06/09/2018 8:25 AM    [place]

## 2018-06-09 NOTE — Patient Instructions (Addendum)
It was a pleasure to speak with you on the phone today! Thank you for allowing [place] to continue taking care of your [facility] needs during this time.   Feel free to call as needed for questions and concerns related to your cardiac needs.   Medication Instructions:  Your physician has recommended you make the following change in your medication:  1- STOP Amlodipine 2- INCREASE Coreg to 1 tablet (12.5 mg total) twice daily  If you need a refill on your cardiac medications before your next appointment, please call your pharmacy.   Lab work: None ordered   If you have labs (blood work) drawn today and your tests are completely normal, you will receive your results only by: Marland Kitchen MyChart Message (if you have MyChart) OR . A paper copy in the mail If you have any lab test that is abnormal or we need to change your treatment, we will call you to review the results.  Testing/Procedures: 1-  Zio XT: We will place order and you will receive it in the mail.  You may get a call from [employer] @ either  [phone] Or  [phone] for them to confirm your address before it will be sent to you.  You will wear the monitor for 14 days, remove it and send it back to the company. They will send [place] a report. Then we will call you with the results.  [place]  Your caregiver has ordered a Stress Test with nuclear imaging. The purpose of this test is to evaluate the blood supply to your heart muscle. This procedure is referred to as a "Non-Invasive Stress Test." This is because other than having an IV started in your vein, nothing is inserted or "invades" your body. Cardiac stress tests are done to find areas of poor blood flow to the heart by determining the extent of coronary artery disease (CAD). Some patients exercise on a treadmill, which naturally increases the blood flow to your heart, while others who are  unable to walk on a treadmill due to physical limitations have a pharmacologic/chemical stress  agent called Lexiscan . This medicine will mimic walking on a treadmill by temporarily increasing your coronary blood flow.   Please note: these test may take anywhere between 2-4 hours to complete  PLEASE REPORT TO [place] AT THE FIRST DESK WILL DIRECT YOU WHERE TO GO  Date of Procedure:_____________________________________  Arrival Time for Procedure:______________________________  Instructions regarding medication:   __x__:  Hold betablocker(s) night before procedure and morning of procedure (Coreg)  ____________________________________________________________________________________________________________________________________________  PLEASE NOTIFY THE OFFICE AT LEAST 24 HOURS IN ADVANCE IF YOU ARE UNABLE TO KEEP YOUR APPOINTMENT.  [phone] AND  PLEASE NOTIFY NUCLEAR MEDICINE AT [facility] AT LEAST 24 HOURS IN ADVANCE IF YOU ARE UNABLE TO KEEP YOUR APPOINTMENT. [phone]  How to prepare for your Myoview test:  1. Do not eat or drink after midnight 2. No caffeine for 24 hours prior to test 3. No smoking 24 hours prior to test. 4. Your medication may be taken with water.  If your doctor stopped a medication because of this test, do not take that medication. 5. Ladies, please do not wear dresses.  Skirts or pants are appropriate. Please wear a short sleeve shirt. 6. No perfume, cologne or lotion. 7. Wear comfortable walking shoes. No heels!    Follow-Up: At [facility], you and your health needs are our priority.  As part of our continuing mission to provide  you with exceptional heart care, we have created designated Provider Care Teams.  These Care Teams include your primary Cardiologist (physician) and Advanced Practice Providers (APPs -  Physician Assistants and Nurse Practitioners) who all work together to provide you with the care you need, when you need it. You will need a follow up appointment in 3-4 months.  Please see Ida Rogue, MD.

## 2018-06-12 ENCOUNTER — Ambulatory Visit
Admission: RE | Admit: 2018-06-12 | Discharge: 2018-06-12 | Disposition: A | Discharge status: Home / Self Care | Payer: Medicare Other | Source: Ambulatory Visit | Attending: Family | Admitting: Family

## 2018-06-12 ENCOUNTER — Other Ambulatory Visit: Payer: Self-pay

## 2018-06-12 DIAGNOSIS — R531 Weakness: Secondary | ICD-10-CM

## 2018-06-12 DIAGNOSIS — R5383 Other fatigue: Secondary | ICD-10-CM | POA: Diagnosis present

## 2018-06-12 DIAGNOSIS — R51 Headache: Secondary | ICD-10-CM | POA: Diagnosis present

## 2018-06-12 DIAGNOSIS — R519 Headache, unspecified: Secondary | ICD-10-CM

## 2018-06-12 MED ORDER — GADOBUTROL 1 MMOL/ML IV SOLN
6.0000 mL | Freq: Once | INTRAVENOUS | Status: AC | PRN
  Administered 2018-06-12: 6 mL via INTRAVENOUS

## 2018-06-14 ENCOUNTER — Ambulatory Visit (INDEPENDENT_AMBULATORY_CARE_PROVIDER_SITE_OTHER): Payer: Medicare Other

## 2018-06-14 DIAGNOSIS — R002 Palpitations: Secondary | ICD-10-CM

## 2018-06-15 ENCOUNTER — Telehealth: Payer: Self-pay | Admitting: Physician Assistant

## 2018-06-15 NOTE — Telephone Encounter (Signed)
Patient would also like to go over her instructions for her stress test tomorrow morning.

## 2018-06-15 NOTE — Telephone Encounter (Signed)
Patient states she is having problems with her monitor, states it is flashing red. Please call to discuss.

## 2018-06-15 NOTE — Telephone Encounter (Signed)
Returned call to patient.   I tried giving patient options to trouble shoot zio patch to get a better connection. Pt reports that she had already tried these things.   I advised pt to call zio to further assist. She has the number and the serial number at hand.   I gave verbal instructions for stress test tomorrow and confirmed that she will hold the Coreg this evening before and the morning of test.  Pt verbalized understanding and had no further questions at this time.

## 2018-06-16 ENCOUNTER — Encounter
Admission: RE | Admit: 2018-06-16 | Discharge: 2018-06-16 | Disposition: A | Discharge status: Home / Self Care | Payer: Medicare Other | Source: Ambulatory Visit | Attending: Physician Assistant | Admitting: Physician Assistant

## 2018-06-16 ENCOUNTER — Other Ambulatory Visit: Payer: Self-pay

## 2018-06-16 ENCOUNTER — Other Ambulatory Visit: Payer: Self-pay | Admitting: Student

## 2018-06-16 DIAGNOSIS — R1010 Upper abdominal pain, unspecified: Secondary | ICD-10-CM

## 2018-06-16 DIAGNOSIS — R0789 Other chest pain: Secondary | ICD-10-CM

## 2018-06-16 DIAGNOSIS — R079 Chest pain, unspecified: Secondary | ICD-10-CM | POA: Insufficient documentation

## 2018-06-16 DIAGNOSIS — K30 Functional dyspepsia: Secondary | ICD-10-CM

## 2018-06-16 LAB — NM MYOCAR MULTI W/SPECT W/WALL MOTION / EF
LV dias vol: 77 mL (ref 46–106)
LV sys vol: 25 mL
Peak HR: 106 {beats}/min
Percent HR: 66 %
Rest HR: 66 {beats}/min
SDS: 9
SRS: 2
SSS: 11
TID: 0.98

## 2018-06-16 MED ORDER — TECHNETIUM TC 99M TETROFOSMIN IV KIT
10.0000 | PACK | Freq: Once | INTRAVENOUS | Status: AC | PRN
  Administered 2018-06-16: 10.41 via INTRAVENOUS

## 2018-06-16 MED ORDER — REGADENOSON 0.4 MG/5ML IV SOLN
0.4000 mg | Freq: Once | INTRAVENOUS | Status: AC
  Administered 2018-06-16: 0.4 mg via INTRAVENOUS

## 2018-06-16 MED ORDER — TECHNETIUM TC 99M TETROFOSMIN IV KIT
30.0000 | PACK | Freq: Once | INTRAVENOUS | Status: AC | PRN
  Administered 2018-06-16: 31.92 via INTRAVENOUS

## 2018-06-19 ENCOUNTER — Telehealth: Payer: Self-pay | Admitting: Physician Assistant

## 2018-06-19 DIAGNOSIS — A048 Other specified bacterial intestinal infections: Secondary | ICD-10-CM

## 2018-06-19 HISTORY — DX: Other specified bacterial intestinal infections: A04.8

## 2018-06-19 NOTE — Telephone Encounter (Signed)
Called to discuss results from stress test with patient. She verbalized understanding and will await zio results.   All questions answered.   Advised pt to call for any further questions or concerns.

## 2018-06-19 NOTE — Telephone Encounter (Signed)
Patient calling to discuss recent  Stress testing results   Please call before 12 as patient has an appt this afternoon

## 2018-06-20 ENCOUNTER — Ambulatory Visit: Payer: Medicare Other

## 2018-06-23 ENCOUNTER — Other Ambulatory Visit: Payer: Self-pay

## 2018-06-23 ENCOUNTER — Ambulatory Visit
Admission: RE | Admit: 2018-06-23 | Discharge: 2018-06-23 | Disposition: A | Discharge status: Home / Self Care | Payer: Medicare Other | Source: Ambulatory Visit | Attending: Student | Admitting: Student

## 2018-06-23 DIAGNOSIS — K30 Functional dyspepsia: Secondary | ICD-10-CM | POA: Insufficient documentation

## 2018-06-23 DIAGNOSIS — R0789 Other chest pain: Secondary | ICD-10-CM | POA: Insufficient documentation

## 2018-06-23 DIAGNOSIS — R1010 Upper abdominal pain, unspecified: Secondary | ICD-10-CM | POA: Insufficient documentation

## 2018-06-26 DIAGNOSIS — M6281 Muscle weakness (generalized): Secondary | ICD-10-CM | POA: Insufficient documentation

## 2018-06-27 ENCOUNTER — Encounter: Payer: Medicare Other | Admitting: Internal Medicine

## 2018-07-06 ENCOUNTER — Other Ambulatory Visit: Payer: Self-pay

## 2018-07-14 ENCOUNTER — Telehealth: Payer: Self-pay | Admitting: Physician Assistant

## 2018-07-14 NOTE — Telephone Encounter (Signed)
Dr. Rockey Situ, can you please review the patient's monitor- it is in your in basket.

## 2018-07-14 NOTE — Telephone Encounter (Signed)
Await formal read, preliminary read shows NSR with 2 runs of SVT with the fastest and longest episode lasting 4 beats with a maximum rate of 144 bpm. PACs, atrial couplets, triplets, and isolated PVCs were rare. Patient triggered event corresponded with SVT.

## 2018-07-14 NOTE — Telephone Encounter (Signed)
Please call regarding monitor. Patient states she is still having a racing heart, dizziness, tachycardia.

## 2018-07-17 ENCOUNTER — Telehealth: Payer: Self-pay

## 2018-07-17 ENCOUNTER — Other Ambulatory Visit: Payer: Self-pay | Admitting: Certified Nurse Midwife

## 2018-07-17 DIAGNOSIS — Z1239 Encounter for other screening for malignant neoplasm of breast: Secondary | ICD-10-CM

## 2018-07-17 DIAGNOSIS — Z1231 Encounter for screening mammogram for malignant neoplasm of breast: Secondary | ICD-10-CM

## 2018-07-17 NOTE — Telephone Encounter (Signed)
Pt calling; needs mammogram; called to schedule it and was told to see CLG first.  Pt states she doesn't need to see CLG - had hyst; no issues; no IC in 27yrs.  Just needs mammogram.  Please call when order placed.  [phone]

## 2018-07-17 NOTE — Telephone Encounter (Signed)
Pt aware; states she is still taking the hormones; tx'd to [place] to schedule breast exam only.

## 2018-07-17 NOTE — Telephone Encounter (Signed)
I placed the order. [place] generally wants the patient to have a breast exam in the last year before they will schedule mammogram. Is she still taking hormones, or has she weaned off the hormones?

## 2018-07-17 NOTE — Telephone Encounter (Signed)
Thanks

## 2018-07-19 NOTE — Telephone Encounter (Signed)
Please see even monitor report Most patient triggered events not associated with significant arrhythmia

## 2018-07-20 NOTE — Telephone Encounter (Signed)
Notes recorded by Rise Mu, PA-C on 07/20/2018 at 7:11 AM EDT  Monitor showed an average HR of 70 bpm.    2 Supraventricular Tachycardia runs occurred, the run with the fastest interval lasting 4 beats with a max rate of 144 bpm and the longest lasting 4 beats with an avg rate of 112 bpm.    Isolated PACs atrial couplets, triplets, and PVCs were rare (<1.0%).    Most of the patient triggered events were not associated with significant arrhythmia.   Recommend continued dose of Coreg. If palpitations become more frequent, could consider prn medication, though based on such a small amount of ectopy on Zio, would not recommend scheduled dosing or titration of beta blocker at this time given sensitivity to medication and multiple intolerances. Follow up with primary cardiologist as directed.

## 2018-07-20 NOTE — Telephone Encounter (Signed)
I spoke with the patient regarding her monitor results. She voices understanding and is agreeable. I have advised if symptoms worsen to please call back and let us know. She again is agreeable.

## 2018-07-27 ENCOUNTER — Other Ambulatory Visit: Payer: Self-pay

## 2018-07-27 ENCOUNTER — Ambulatory Visit (INDEPENDENT_AMBULATORY_CARE_PROVIDER_SITE_OTHER): Payer: Medicare Other | Admitting: Certified Nurse Midwife

## 2018-07-27 ENCOUNTER — Encounter: Payer: Self-pay | Admitting: Certified Nurse Midwife

## 2018-07-27 VITALS — BP 100/70 | Ht 61.0 in | Wt 144.0 lb

## 2018-07-27 DIAGNOSIS — Z01419 Encounter for gynecological examination (general) (routine) without abnormal findings: Secondary | ICD-10-CM

## 2018-07-27 DIAGNOSIS — I2510 Atherosclerotic heart disease of native coronary artery without angina pectoris: Secondary | ICD-10-CM | POA: Diagnosis not present

## 2018-07-27 DIAGNOSIS — Z0000 Encounter for general adult medical examination without abnormal findings: Secondary | ICD-10-CM

## 2018-07-29 ENCOUNTER — Encounter: Payer: Self-pay | Admitting: Certified Nurse Midwife

## 2018-07-29 NOTE — Progress Notes (Signed)
Obstetrics & Gynecology Office Visit   Chief Complaint:  Chief Complaint  Patient presents with  . breast exam    History of Present Illness: 58 year old BF presents for a breast exam. Had her last annual gyn exam 01/24/2017 which was normal. Pap 01/24/2017 was NIL/negative HRHPV.  Last mammogram was 06/03/2017 Has not scheduled her mammogram yet for this year.  Has been taking 2 mgm estrodiol po daily since her [place] and BSO. She tried decreasing the dose of estradiol to 1 mgm daily last year, but could not tolerate.   Her past medical history is remarkable for asthma (mild intermittent), a GI bleed/peptic ulcer, osteoarthritis of the hand and knees, and rotator cuff tear. IN December 2019 she had a lung biopsy of a lung mass, complicated by a pneumothorax. She still has some left sided chest paresthesias for the   She has a family history of breast cancer in her only sister at age 17. There is also a family history of ovarian cancer in a maternal aunt (one of 5 maternal aunts). THere is a family history of uterine cancer in a Paternal aunt (one of 69). Genetic testing has not been done. .  Review of Systems:  ROS   Past Medical History:  Past Medical History:  Diagnosis Date  . Asthma    controlled  . Dyspnea 12/2017   difficulty getting her breath at all times  . GERD (gastroesophageal reflux disease)   . H/O: GI bleed   . Hypertension   . Lung mass   . Mitral valve prolapse   . Osteoarthritis    knees    Past Surgical History:  Past Surgical History:  Procedure Laterality Date  . ABDOMINAL HYSTERECTOMY  2002  . ANTERIOR FUSION CERVICAL SPINE  2009  . COLONOSCOPY  2013   normal  . ELECTROMAGNETIC NAVIGATION BROCHOSCOPY Left 12/12/2017   Procedure: ELECTROMAGNETIC NAVIGATION BRONCHOSCOPY;  Surgeon: Flora Lipps, MD;  Location: [facility];  Service: Cardiopulmonary;  Laterality: Left;  . ESOPHAGOGASTRODUODENOSCOPY  1990   GI bleed  . FLEXIBLE BRONCHOSCOPY Left 12/12/2017   Procedure: FLEXIBLE BRONCHOSCOPY;  Surgeon: Flora Lipps, MD;  Location: [facility];  Service: Cardiopulmonary;  Laterality: Left;  . KNEE ARTHROSCOPY Right 2010   x3  . KNEE SURGERY Left    Arthroscopy x1  . LAPAROSCOPIC SUPRACERVICAL HYSTERECTOMY  2002   with BSO for fibroids/ AUB/ pelvic pain  . LAPAROSCOPIC TUBAL LIGATION  1982  . LUNG BIOPSY    . SHOULDER SURGERY Right    rotator cuff repair  . SHOULDER SURGERY Right 2011   second rotator cuff repair  . tubal reanastamosis  1985    Gynecologic History: No LMP recorded. Patient has had a hysterectomy.  Obstetric History: [id number]  Family History:  Family History  Problem Relation Age of Onset  . Diabetes Mother   . Dementia Mother        Alzheimers  . Hypertension Mother   . Glaucoma Mother   . Prostate cancer Father 98  . Stroke Father   . Arthritis Father   . Osteoporosis Father   . Prostate cancer Brother   . Breast cancer Sister 63  . Uterine cancer Paternal Aunt        1 of 4 paternal aunts  . Ovarian cancer Maternal Aunt        1 of 5 maternal aunts    Social History:  Social History   Socioeconomic History  . Marital status: Divorced  Spouse name: Not on file  . Number of children: 3  . Years of education: Not on file  . Highest education level: Not on file  Occupational History  . Occupation: CMA and phlebotomist    Comment: not working but would like to  Social Needs  . Financial resource strain: Not on file  . Food insecurity    Worry: Not on file    Inability: Not on file  . Transportation needs    Medical: Not on file    Non-medical: Not on file  Tobacco Use  . Smoking status: Former Smoker    Packs/day: 1.00    Years: 13.00    Pack years: 13.00  . Smokeless tobacco: Never Used  Substance and Sexual Activity  . Alcohol use: Not Currently  . Drug use: No  . Sexual activity: Not Currently    Birth control/protection: Post-menopausal  Lifestyle  . Physical activity    Days per week:  5 days    Minutes per session: 50 min  . Stress: Not at all  Relationships  . Social connections    Talks on phone: More than three times a week    Gets together: More than three times a week    Attends religious service: More than 4 times per year    Active member of club or organization: No    Attends meetings of clubs or organizations: Never    Relationship status: Divorced  . Intimate partner violence    Fear of current or ex partner: No    Emotionally abused: No    Physically abused: No    Forced sexual activity: No  Other Topics Concern  . Not on file  Social History Narrative   Independent at baseline.  Lives at home by herself    Allergies:  Allergies  Allergen Reactions  . Shellfish Allergy Anaphylaxis    Other reaction(s): anaphylaxis/angioedema Other reaction(s): anaphylaxis/angioedema  Betadine on skin is okay  . Tape Dermatitis    Patient stated Tegaderm after surgery as well as a clear tape tear off her top skin layer and leave her raw. Was told to notify team allergy to tape.   . Tramadol Other (See Comments)    "blacked out", found wandering in the street  . Celecoxib Other (See Comments)    GI bleed Other reaction(s): gi distress, Other (comments), Other (See Comments) GI BLEED      GI bleeding  GI BLEED  GI Bleeding   GI bleed Other reaction(s): gi distress, Other (comments), Other (See Comments) GI BLEED  . Gabapentin Other (See Comments)    Drowsiness. Like a zombie, not functional   . Hydrocodone Other (See Comments)    Hair falls out.  Marland Kitchen Hydrocodone-Acetaminophen     Other reaction(s): other/intolerance HAIR FALLS OUT   . Ibuprofen Other (See Comments)    "Blood disorder", GI bleed. Other reaction(s): gi distress, Other (comments), Other (See Comments)   . Naproxen Other (See Comments)    GI bleed Other reaction(s): Other (comments), Other (See Comments)        . Prednisone Other (See Comments)    Tremors, severe  .  Singulair [Montelukast] Other (See Comments)    Syncope..passed out  . Strawberry Extract Hives, Nausea And Vomiting and Nausea Only    Severe vomitting  . Codeine Itching and Nausea Only    Other reaction(s): mild rash/itching  . Doxycycline Other (See Comments)    Unknown reaction. Other reaction(s): Other (See Comments), Unknown, Unknown (comments)  Pt does not remember   . Eggs Or Egg-Derived Products Rash and Other (See Comments)    rash  . Latex Rash and Itching    Other reaction(s): mild rash/itching  . Nsaids Nausea And Vomiting and Other (See Comments)    Gastric bleed        . Strawberry Flavor Hives  . Sulfamethoxazole-Trimethoprim Other (See Comments)    Other reaction(s): Other (See Comments), other/intolerance MAKES PAIN WORSE  Increases pain Increases pain     Medications: Prior to Admission medications   Medication Sig Start Date End Date Taking? Authorizing Provider  estradiol (ESTRACE) 2 MG tablet Take 1 tablet (2 mg total) by mouth daily. 04/03/18  Yes Dalia Heading, CNM  Ascorbic Acid (VITAMIN C) 1000 MG tablet Take 1,000 mg by mouth daily.    [provider]  b complex vitamins capsule Take 1 capsule by mouth daily.    [provider]  Biotin 10000 MCG TABS Take 1 tablet by mouth daily.    [provider]  carvedilol (COREG) 12.5 MG tablet Take 12.5 mg by mouth 2 (two) times daily.    [provider]  cholecalciferol (VITAMIN D3) 25 MCG (1000 UT) tablet Take 5,000 Units by mouth daily.    [provider]  LECITHIN PO Take by mouth every other day.    [provider]  oxyCODONE (OXY IR/ROXICODONE) 5 MG immediate release tablet Take 5 mg by mouth as needed.    [provider]  VENTOLIN HFA 108 (90 Base) MCG/ACT inhaler Inhale 2 puffs into the lungs every 6 (six) hours as needed for wheezing or shortness of breath.  11/14/17   [provider]  zinc gluconate 50 MG tablet Take 50  mg by mouth daily.    [provider]    Physical Exam Vitals: BP 100/70   Ht _0  (1.549 m)   Wt 144 lb (65.3 kg)   BMI 27.21 kg/m     Physical Exam  Constitutional: She is oriented to person, place, and time. She appears well-developed and well-nourished. No distress.  Respiratory: Effort normal.  Breasts: soft, NT, no masses. No axillary, infraclavicular or supraclavicular lymphadenopathy.  Neurological: She is alert and oriented to person, place, and time.  Skin: Skin is warm and dry.  Psychiatric: She has a normal mood and affect.     Assessment: 58 y.o. [id number] with normal breast exam  Plan: Mammogram was ordered. Patient to schedule. RTO 1 year for gyn exam  Dalia Heading, CNM

## 2018-07-31 NOTE — Telephone Encounter (Signed)
Attempted to call patient for new patient appointment questions.  Left messgae.

## 2018-07-31 NOTE — Progress Notes (Deleted)
Patient's Name: Carmen Grimes  MRN: [id number]  Referring Provider: Mechele Claude, FNP  DOB: 1960-03-31  PCP: Perrin Maltese, MD  DOS: 08/01/2018  Note by: Gillis Santa, MD  Service setting: Ambulatory outpatient  Specialty: Interventional Pain Management  Location: [facility] (AMB) Pain Management Facility  Visit type: Initial Patient Evaluation  Patient type: New Patient   Primary Reason(s) for Visit: Encounter for initial evaluation of one or more chronic problems (new to examiner) potentially causing chronic pain, and posing a threat to normal musculoskeletal function. (Level of risk: High) CC: No chief complaint on file.  HPI  Carmen Grimes is a 58 y.o. year old, female patient, who comes today to see [place] for the first time for an initial evaluation of her chronic pain. She has H/O: GI bleed; Osteoarthritis; GERD (gastroesophageal reflux disease); Asthma; Abnormal weight gain; Acute bilateral low back pain with bilateral sciatica; Acute pain of left shoulder; Anxiety; Breast pain, left; Cervical pain (neck); Cervical radiculopathy; Cervical spondylosis without myelopathy; Chest pain; Degeneration of lumbar intervertebral disc; Fatigue; Hair loss; Impingement syndrome of left shoulder; Left forearm pain; Lichen planus; Mild intermittent asthma; Muscle spasm; Primary osteoarthritis of right knee; Right hip pain; Diffuse cervicobrachial syndrome; Synovitis of right hand; Vitamin D deficiency; Lung nodule; Pneumothorax; and H/O chest tube placement on their problem list. Today she comes in for evaluation of her No chief complaint on file.  Pain Assessment: Location:     Radiating:   Onset:   Duration:   Quality:   Severity:  /10 (subjective, self-reported pain score)  Note: Reported level is compatible with observation.                         When using our objective Pain Scale, levels between 6 and 10/10 are said to belong in an emergency room, as it progressively worsens from a 6/10, described as  severely limiting, requiring emergency care not usually available at an outpatient pain management facility. At a 6/10 level, communication becomes difficult and requires great effort. Assistance to reach the emergency department may be required. Facial flushing and profuse sweating along with potentially dangerous increases in heart rate and blood pressure will be evident. Effect on ADL:   Timing:   Modifying factors:   BP:    HR:    Onset and Duration: {Hx; Onset and Duration:210120511} Cause of pain: {Hx; Cause:210120521} Severity: {Pain Severity:210120502} Timing: {Symptoms; Timing:210120501} Aggravating Factors: {Causes; Aggravating pain factors:210120507} Alleviating Factors: {Causes; Alleviating Factors:210120500} Associated Problems: {Hx; Associated problems:210120515} Quality of Pain: {Hx; Symptom quality or Descriptor:210120531} Previous Examinations or Tests: {Hx; Previous examinations or test:210120529} Previous Treatments: {Hx; Previous Treatment:210120503}  The patient comes into the clinics today for the first time for a chronic pain management evaluation. ***  Today I took the time to provide the patient with information regarding my pain practice. The patient was informed that my practice is divided into two sections: an interventional pain management section, as well as a completely separate and distinct medication management section. I explained that I have procedure days for my interventional therapies, and evaluation days for follow-ups and medication management. Because of the amount of documentation required during both, they are kept separated. This means that there is the possibility that she may be scheduled for a procedure on one day, and medication management the next. I have also informed her that because of staffing and facility limitations, I no longer take patients for medication management only. To illustrate the reasons  for this, I gave the patient the example of  surgeons, and how inappropriate it would be to refer a patient to his/her care, just to write for the post-surgical antibiotics on a surgery done by a different surgeon.   Because interventional pain management is my board-certified specialty, the patient was informed that joining my practice means that they are open to any and all interventional therapies. I made it clear that this does not mean that they will be forced to have any procedures done. What this means is that I believe interventional therapies to be essential part of the diagnosis and proper management of chronic pain conditions. Therefore, patients not interested in these interventional alternatives will be better served under the care of a different practitioner.  The patient was also made aware of my Comprehensive Pain Management Safety Guidelines where by joining my practice, they limit all of their nerve blocks and joint injections to those done by our practice, for as long as we are retained to manage their care.   Historic Controlled Substance Pharmacotherapy Review  PMP and historical list of controlled substances: ***  Highest opioid analgesic regimen found: ***  Most recent opioid analgesic: ***  Current opioid analgesics:  ***  Highest recorded MME/day: *** mg/day MME/day: *** mg/day  Medications: The patient did not bring the medication(s) to the appointment, as requested in our "New Patient Package" Pharmacodynamics: Desired effects: Analgesia: The patient reports >50% benefit. Reported improvement in function: The patient reports medication allows her to accomplish basic ADLs. Clinically meaningful improvement in function (CMIF): Sustained CMIF goals met Perceived effectiveness: Described as relatively effective, allowing for increase in activities of daily living (ADL) Undesirable effects: Side-effects or Adverse reactions: None reported Historical Monitoring: The patient  reports no history of drug use. List of  all UDS Test(s): No results found for: MDMA, COCAINSCRNUR, [place], [place], CANNABQUANT, [place], [place] List of other Serum/Urine Drug Screening Test(s):  No results found for: AMPHSCRSER, BARBSCRSER, BENZOSCRSER, COCAINSCRSER, COCAINSCRNUR, PCPSCRSER, PCPQUANT, THCSCRSER, THCU, CANNABQUANT, OPIATESCRSER, OXYSCRSER, PROPOXSCRSER, ETH Historical Background Evaluation: [place] PMP: PDMP not reviewed this encounter. Six (6) year initial data search conducted.             PMP NARX Score Report:  Narcotic: *** Sedative: *** Stimulant: *** [place] Department of public safety, offender search: Editor, commissioning Information) Non-contributory Risk Assessment Profile: Aberrant behavior: None observed or detected today Risk factors for fatal opioid overdose: None identified today PMP NARX Overdose Risk Score: *** Fatal overdose hazard ratio (HR): Calculation deferred Non-fatal overdose hazard ratio (HR): Calculation deferred Risk of opioid abuse or dependence: 0.7-3.0% with doses ? 36 MME/day and 6.1-26% with doses ? 120 MME/day. Substance use disorder (SUD) risk level: See below Personal History of Substance Abuse (SUD-Substance use disorder):  Alcohol:    Illegal Drugs:    Rx Drugs:    ORT Risk Level calculation:    ORT Scoring interpretation table:  Score <3 = Low Risk for SUD  Score between 4-7 = Moderate Risk for SUD  Score >8 = High Risk for Opioid Abuse   PHQ-2 Depression Scale:  Total score:    PHQ-2 Scoring interpretation table: (Score and probability of major depressive disorder)  Score 0 = No depression  Score 1 = 15.4% Probability  Score 2 = 21.1% Probability  Score 3 = 38.4% Probability  Score 4 = 45.5% Probability  Score 5 = 56.4% Probability  Score 6 = 78.6% Probability   PHQ-9 Depression Scale:  Total score:    PHQ-9  Scoring interpretation table:  Score 0-4 = No depression  Score 5-9 = Mild depression  Score 10-14 = Moderate depression  Score 15-19 = Moderately severe depression   Score 20-27 = Severe depression (2.4 times higher risk of SUD and 2.89 times higher risk of overuse)   Pharmacologic Plan: As per protocol, I have not taken over any controlled substance management, pending the results of ordered tests and/or consults.            Initial impression: Pending review of available data and ordered tests.  Meds   Current Outpatient Medications:  .  Ascorbic Acid (VITAMIN C) 1000 MG tablet, Take 1,000 mg by mouth daily., Disp: , Rfl:  .  b complex vitamins capsule, Take 1 capsule by mouth daily., Disp: , Rfl:  .  Biotin 10000 MCG TABS, Take 1 tablet by mouth daily., Disp: , Rfl:  .  carvedilol (COREG) 12.5 MG tablet, Take 12.5 mg by mouth 2 (two) times daily., Disp: , Rfl:  .  cholecalciferol (VITAMIN D3) 25 MCG (1000 UT) tablet, Take 5,000 Units by mouth daily., Disp: , Rfl:  .  estradiol (ESTRACE) 2 MG tablet, Take 1 tablet (2 mg total) by mouth daily., Disp: 90 tablet, Rfl: 3 .  LECITHIN PO, Take by mouth every other day., Disp: , Rfl:  .  oxyCODONE (OXY IR/ROXICODONE) 5 MG immediate release tablet, Take 5 mg by mouth as needed., Disp: , Rfl:  .  VENTOLIN HFA 108 (90 Base) MCG/ACT inhaler, Inhale 2 puffs into the lungs every 6 (six) hours as needed for wheezing or shortness of breath. , Disp: , Rfl: 6 .  zinc gluconate 50 MG tablet, Take 50 mg by mouth daily., Disp: , Rfl:   Imaging Review  Cervical Imaging: Cervical MR wo contrast: No results found for this or any previous visit. Cervical MR wo contrast: No procedure found. Cervical MR w/wo contrast: No results found for this or any previous visit. Cervical MR w contrast: No results found for this or any previous visit. Cervical CT wo contrast: No results found for this or any previous visit. Cervical CT w/wo contrast: No results found for this or any previous visit. Cervical CT w/wo contrast: No results found for this or any previous visit. Cervical CT w contrast: No results found for this or any  previous visit. Cervical CT outside: No results found for this or any previous visit. Cervical DG 1 view: No results found for this or any previous visit. Cervical DG 2-3 views: No results found for this or any previous visit. Cervical DG F/E views: No results found for this or any previous visit. Cervical DG 2-3 clearing views: No results found for this or any previous visit. Cervical DG Bending/F/E views: No results found for this or any previous visit. Cervical DG complete: No results found for this or any previous visit. Cervical DG Myelogram views: No results found for this or any previous visit. Cervical DG Myelogram views: No results found for this or any previous visit. Cervical Discogram views: No results found for this or any previous visit.  Shoulder Imaging: Shoulder-R MR w contrast: No results found for this or any previous visit. Shoulder-L MR w contrast: No results found for this or any previous visit. Shoulder-R MR w/wo contrast: No results found for this or any previous visit. Shoulder-L MR w/wo contrast: No results found for this or any previous visit. Shoulder-R MR wo contrast: No results found for this or any previous visit. Shoulder-L MR  wo contrast:  Results for orders placed during the hospital encounter of 03/23/16  MR SHOULDER LEFT WO CONTRAST   Narrative CLINICAL DATA:  Left shoulder pain with limited range of motion for 1 month  EXAM: MRI OF THE LEFT SHOULDER WITHOUT CONTRAST  TECHNIQUE: Multiplanar, multisequence MR imaging of the shoulder was performed. No intravenous contrast was administered.  COMPARISON:  None.  FINDINGS: Rotator cuff: Moderate tendinosis of the supraspinatus tendon with a small partial-thickness bursal surface tear. Mild tendinosis of the infraspinatus tendon. Teres minor tendon is intact. Subscapularis tendon is intact.  Muscles: No atrophy or fatty replacement of nor abnormal signal within, the muscles of the rotator  cuff.  Biceps long head:  Intact.  Acromioclavicular Joint: Moderate arthropathy of the acromioclavicular joint. Type I acromion. Moderate amount of subacromial/subdeltoid bursal fluid.  Glenohumeral Joint: No joint effusion. Mild partial-thickness cartilage loss of the glenoid with mild subchondral reactive marrow changes.  Labrum: Grossly intact, but evaluation is limited by lack of intraarticular fluid.  Bones: No other marrow signal abnormality. No fracture or dislocation.  Other: No fluid collection or hematoma.  IMPRESSION: 1. Moderate tendinosis of the supraspinatus tendon with a small partial-thickness bursal surface tear. 2. Mild tendinosis of the infraspinatus tendon. 3. Moderate subacromial/subdeltoid bursitis. 4. Mild partial-thickness cartilage loss of the glenoid with mild subchondral reactive marrow changes.   Electronically Signed   By: Kathreen Devoid   On: 03/23/2016 10:17    Shoulder-R CT w contrast: No results found for this or any previous visit. Shoulder-L CT w contrast: No results found for this or any previous visit. Shoulder-R CT w/wo contrast: No results found for this or any previous visit. Shoulder-L CT w/wo contrast: No results found for this or any previous visit. Shoulder-R CT wo contrast: No results found for this or any previous visit. Shoulder-L CT wo contrast: No results found for this or any previous visit. Shoulder-R DG Arthrogram: No results found for this or any previous visit. Shoulder-L DG Arthrogram: No results found for this or any previous visit. Shoulder-R DG 1 view: No results found for this or any previous visit. Shoulder-L DG 1 view: No results found for this or any previous visit. Shoulder-R DG: No results found for this or any previous visit. Shoulder-L DG: No results found for this or any previous visit.  Thoracic Imaging: Thoracic MR wo contrast: No results found for this or any previous visit. Thoracic MR wo contrast:  No procedure found. Thoracic MR w/wo contrast: No results found for this or any previous visit. Thoracic MR w contrast: No results found for this or any previous visit. Thoracic CT wo contrast: No results found for this or any previous visit. Thoracic CT w/wo contrast: No results found for this or any previous visit. Thoracic CT w/wo contrast: No results found for this or any previous visit. Thoracic CT w contrast: No results found for this or any previous visit. Thoracic DG 2-3 views:  Results for orders placed during the hospital encounter of 06/09/16  DG Thoracic Spine 2 View   Narrative CLINICAL DATA:  Back pain.  EXAM: THORACIC SPINE 2 VIEWS  COMPARISON:  06/09/2016 .  FINDINGS: Diffuse osteopenia and degenerative change thoracic spine. No acute bony abnormality identified. Mild scoliosis concave right. Prior cervical spine fusion  IMPRESSION: Diffuse osteopenia and degenerative changes with mild scoliosis concave right. No acute abnormality identified.   Electronically Signed   By: Marcello Moores  Register   On: 06/09/2016 16:28    Thoracic DG 4  views: No results found for this or any previous visit. Thoracic DG: No results found for this or any previous visit. Thoracic DG w/swimmers view: No results found for this or any previous visit. Thoracic DG Myelogram views: No results found for this or any previous visit. Thoracic DG Myelogram views: No results found for this or any previous visit.  Lumbosacral Imaging: Lumbar MR wo contrast: No results found for this or any previous visit. Lumbar MR wo contrast: No procedure found. Lumbar MR w/wo contrast: No results found for this or any previous visit. Lumbar MR w/wo contrast: No results found for this or any previous visit. Lumbar MR w contrast: No results found for this or any previous visit. Lumbar CT wo contrast: No results found for this or any previous visit. Lumbar CT w/wo contrast: No results found for this or any  previous visit. Lumbar CT w/wo contrast: No results found for this or any previous visit. Lumbar CT w contrast: No results found for this or any previous visit. Lumbar DG 1V: No results found for this or any previous visit. Lumbar DG 1V (Clearing): No results found for this or any previous visit. Lumbar DG 2-3V (Clearing): No results found for this or any previous visit. Lumbar DG 2-3 views: No results found for this or any previous visit. Lumbar DG (Complete) 4+V: No results found for this or any previous visit.       Lumbar DG F/E views: No results found for this or any previous visit.       Lumbar DG Bending views: No results found for this or any previous visit.       Lumbar DG Myelogram views: No results found for this or any previous visit. Lumbar DG Myelogram: No results found for this or any previous visit. Lumbar DG Myelogram: No results found for this or any previous visit. Lumbar DG Myelogram: No results found for this or any previous visit. Lumbar DG Myelogram Lumbosacral: No results found for this or any previous visit. Lumbar DG Diskogram views: No results found for this or any previous visit. Lumbar DG Diskogram views: No results found for this or any previous visit. Lumbar DG Epidurogram OP: No results found for this or any previous visit. Lumbar DG Epidurogram IP: No results found for this or any previous visit.  Sacroiliac Joint Imaging: Sacroiliac Joint DG: No results found for this or any previous visit. Sacroiliac Joint MR w/wo contrast: No results found for this or any previous visit. Sacroiliac Joint MR wo contrast: No results found for this or any previous visit.  Spine Imaging: Whole Spine DG Myelogram views: No results found for this or any previous visit. Whole Spine MR Mets screen: No results found for this or any previous visit. Whole Spine MR Mets screen: No results found for this or any previous visit. Whole Spine MR w/wo: No results found for this or any  previous visit. MRA Spinal Canal w/ cm: No results found for this or any previous visit. MRA Spinal Canal wo/ cm: No procedure found. MRA Spinal Canal w/wo cm: No results found for this or any previous visit. Spine Outside MR Films: No results found for this or any previous visit. Spine Outside CT Films: No results found for this or any previous visit. CT-Guided Biopsy: No results found for this or any previous visit. CT-Guided Needle Placement: No results found for this or any previous visit. DG Spine outside: No results found for this or any previous visit. IR Spine outside:  No results found for this or any previous visit. NM Spine outside: No results found for this or any previous visit.  Hip Imaging: Hip-R MR w contrast: No results found for this or any previous visit. Hip-L MR w contrast: No results found for this or any previous visit. Hip-R MR w/wo contrast: No results found for this or any previous visit. Hip-L MR w/wo contrast: No results found for this or any previous visit. Hip-R MR wo contrast: No results found for this or any previous visit. Hip-L MR wo contrast: No results found for this or any previous visit. Hip-R CT w contrast: No results found for this or any previous visit. Hip-L CT w contrast: No results found for this or any previous visit. Hip-R CT w/wo contrast: No results found for this or any previous visit. Hip-L CT w/wo contrast: No results found for this or any previous visit. Hip-R CT wo contrast: No results found for this or any previous visit. Hip-L CT wo contrast: No results found for this or any previous visit. Hip-R DG 2-3 views: No results found for this or any previous visit. Hip-L DG 2-3 views: No results found for this or any previous visit. Hip-R DG Arthrogram: No results found for this or any previous visit. Hip-L DG Arthrogram: No results found for this or any previous visit. Hip-B DG Bilateral: No results found for this or any previous  visit.  Knee Imaging: Knee-R MR w contrast: No results found for this or any previous visit. Knee-L MR w contrast:  Results for orders placed during the hospital encounter of 07/12/17  MR KNEE LEFT WO CONTRAST   Narrative CLINICAL DATA:  Left knee surgery 2004. Status post fall and felt a pop. Medial pain.  EXAM: MRI OF THE LEFT KNEE WITHOUT CONTRAST  TECHNIQUE: Multiplanar, multisequence MR imaging of the knee was performed. No intravenous contrast was administered.  COMPARISON:  None.  FINDINGS: MENISCI  Medial meniscus:  Intact.  Lateral meniscus: Possible small superior surface tear of the anterior horn of the lateral meniscus towards the root.  LIGAMENTS  Cruciates:  Intact ACL and PCL.  Collaterals: Medial collateral ligament is intact. Lateral collateral ligament complex is intact.  CARTILAGE  Patellofemoral: Partial-thickness cartilage loss of the lateral patellar facet.  Medial: High-grade partial-thickness cartilage loss with areas of full-thickness cartilage loss of the medial femoral condyle and medial tibial plateau.  Lateral: High-grade partial-thickness cartilage loss of the lateral femoral condyle and lateral tibial plateau.  Joint: Small joint effusion. Minimal edema in Hoffa's fat. No plical thickening.  Popliteal Fossa:  No Baker cyst. Intact popliteus tendon.  Extensor Mechanism: Intact quadriceps tendon. Intact patellar tendon. Intact medial patellar retinaculum. Intact lateral patellar retinaculum. Intact MPFL.  Bones:  No acute osseous abnormality.  No aggressive osseous lesion.  Other: No fluid collection or hematoma.  IMPRESSION: 1. Tricompartmental cartilage abnormalities as described above most severe in the medial femorotibial compartment. 2. Possible small superior surface tear of the anterior horn of the lateral meniscus towards the root.   Electronically Signed   By: Kathreen Devoid   On: 07/12/2017 10:43    Knee-R MR  w/wo contrast: No results found for this or any previous visit. Knee-L MR w/wo contrast: No results found for this or any previous visit. Knee-R MR wo contrast: No results found for this or any previous visit. Knee-L MR wo contrast: No results found for this or any previous visit. Knee-R CT w contrast: No results found for this or  any previous visit. Knee-L CT w contrast: No results found for this or any previous visit. Knee-R CT w/wo contrast: No results found for this or any previous visit. Knee-L CT w/wo contrast: No results found for this or any previous visit. Knee-R CT wo contrast: No results found for this or any previous visit. Knee-L CT wo contrast: No results found for this or any previous visit. Knee-R DG 1-2 views: No results found for this or any previous visit. Knee-L DG 1-2 views: No results found for this or any previous visit. Knee-R DG 3 views: No results found for this or any previous visit. Knee-L DG 3 views: No results found for this or any previous visit. Knee-R DG 4 views: No results found for this or any previous visit. Knee-L DG 4 views: No results found for this or any previous visit. Knee-R DG Arthrogram: No results found for this or any previous visit. Knee-L DG Arthrogram: No results found for this or any previous visit.  Ankle Imaging: Ankle-R DG Complete: No results found for this or any previous visit. Ankle-L DG Complete: No results found for this or any previous visit.  Foot Imaging: Foot-R DG Complete: No results found for this or any previous visit. Foot-L DG Complete: No results found for this or any previous visit.  Elbow Imaging: Elbow-R DG Complete: No results found for this or any previous visit. Elbow-L DG Complete: No results found for this or any previous visit.  Wrist Imaging: Wrist-R DG Complete: No results found for this or any previous visit. Wrist-L DG Complete: No results found for this or any previous visit.  Hand Imaging: Hand-R  DG Complete: No results found for this or any previous visit. Hand-L DG Complete: No results found for this or any previous visit.  Complexity Note: Imaging results reviewed. Results shared with Ms. Barton Dubois, using Layman's terms.                         ROS  Cardiovascular: {Hx; Cardiovascular History:210120525} Pulmonary or Respiratory: {Hx; Pumonary and/or Respiratory History:210120523} Neurological: {Hx; Neurological:210120504} Review of Past Neurological Studies:  Results for orders placed or performed during the hospital encounter of 06/12/18  MR BRAIN W WO CONTRAST   Narrative   CLINICAL DATA:  Headaches and tinnitus since September. Worsening vision for 2 months.  EXAM: MRI HEAD WITHOUT AND WITH CONTRAST  TECHNIQUE: Multiplanar, multiecho pulse sequences of the brain and surrounding structures were obtained without and with intravenous contrast.  CONTRAST:  6 mL Gadavist  COMPARISON:  Head CT 01/07/2018 and MRI 12/25/2017  FINDINGS: Brain: There is no evidence of acute infarct, intracranial hemorrhage, mass, midline shift, or extra-axial fluid collection. There is mild cerebral atrophy. Small foci of T2 hyperintensity in the cerebral white matter are unchanged from the prior MRI and nonspecific but compatible with mild chronic small vessel ischemic disease. No abnormal enhancement is identified.  Vascular: Major intracranial vascular flow voids are preserved.  Skull and upper cervical spine: Unremarkable bone marrow signal.  Sinuses/Orbits: Unremarkable orbits. Paranasal sinuses and mastoid air cells are clear.  Other: None.  IMPRESSION: 1. No acute intracranial abnormality. 2. Mild chronic small vessel ischemic disease.   Electronically Signed   By: Logan Bores M.D.   On: 06/12/2018 15:29   Results for orders placed or performed during the hospital encounter of 01/07/18  CT Head Wo Contrast   Narrative   CLINICAL DATA:  Headache and dizziness beginning  this evening.  EXAM: CT HEAD WITHOUT CONTRAST  TECHNIQUE: Contiguous axial images were obtained from the base of the skull through the vertex without intravenous contrast.  COMPARISON:  MRI of the head December 25, 2017.  FINDINGS: BRAIN: No intraparenchymal hemorrhage, mass effect nor midline shift. Borderline parenchymal brain volume loss for age. Patchy supratentorial white matter hypodensities. No acute large vascular territory infarcts. No abnormal extra-axial fluid collections. Basal cisterns are patent.  VASCULAR: Moderate calcific atherosclerosis of the carotid siphons.  SKULL: No skull fracture. No significant scalp soft tissue swelling.  SINUSES/ORBITS: Trace paranasal sinus mucosal thickening. Mastoid air cells are well aerated.The included ocular globes and orbital contents are non-suspicious.  OTHER: None.  IMPRESSION: 1. No acute intracranial process. 2. Borderline parenchymal brain volume loss for age. 3. Minimal chronic small vessel ischemic changes, better characterized on recent MRI of the head.   Electronically Signed   By: Elon Alas M.D.   On: 01/07/2018 19:21    Psychological-Psychiatric: {Hx; Psychological-Psychiatric History:210120512} Gastrointestinal: {Hx; Gastrointestinal:210120527} Genitourinary: {Hx; Genitourinary:210120506} Hematological: {Hx; Hematological:210120510} Endocrine: {Hx; Endocrine history:210120509} Rheumatologic: {Hx; Rheumatological:210120530} Musculoskeletal: {Hx; Musculoskeletal:210120528} Work History: {Hx; Work history:210120514}  Allergies  Ms. Dymek is allergic to shellfish allergy; tape; tramadol; celecoxib; gabapentin; hydrocodone; hydrocodone-acetaminophen; ibuprofen; naproxen; prednisone; singulair [montelukast]; strawberry extract; codeine; doxycycline; eggs or egg-derived products; latex; nsaids; strawberry flavor; and sulfamethoxazole-trimethoprim.  Laboratory Chemistry   SAFETY SCREENING  Profile Lab Results  Component Value Date   HIV Non Reactive 12/12/2017   Inflammation Markers (CRP: Acute Phase) (ESR: Chronic Phase) Lab Results  Component Value Date   ESRSEDRATE 16 02/03/2018   LATICACIDVEN 1.2 05/06/2018                         Rheumatology Markers Lab Results  Component Value Date   LABURIC 4.2 02/01/2018                        Renal Function Markers Lab Results  Component Value Date   BUN 7 06/07/2018   CREATININE 0.69 06/07/2018   GFRAA >60 06/07/2018   GFRNONAA >60 06/07/2018                             Hepatic Function Markers Lab Results  Component Value Date   AST 23 05/05/2018   ALT 12 05/05/2018   ALBUMIN 4.2 05/05/2018   ALKPHOS 71 05/05/2018   LIPASE 21 05/05/2018                        Electrolytes Lab Results  Component Value Date   NA 135 06/07/2018   K 4.1 06/07/2018   CL 102 06/07/2018   CALCIUM 9.1 06/07/2018                        Neuropathy Markers Lab Results  Component Value Date   HIV Non Reactive 12/12/2017                        CNS Tests No results found for: COLORCSF, APPEARCSF, RBCCOUNTCSF, WBCCSF, POLYSCSF, LYMPHSCSF, EOSCSF, PROTEINCSF, GLUCCSF, JCVIRUS, CSFOLI, IGGCSF, LABACHR, ACETBL                      Bone Pathology Markers No results found for: Rives, [id number], [id number], [id number], 25OHVITD1, 25OHVITD2, 25OHVITD3, TESTOFREE, TESTOSTERONE  Coagulation Parameters Lab Results  Component Value Date   PLT 338 06/07/2018                        Cardiovascular Markers Lab Results  Component Value Date   BNP 41.0 05/04/2018   TROPONINI <0.03 06/07/2018   HGB 12.3 06/07/2018   HCT 37.2 06/07/2018                         ID Test(s) Lab Results  Component Value Date   HIV Non Reactive 12/12/2017    CA Markers No results found for: CEA, CA125, LABCA2                      Endocrine Markers Lab Results  Component Value Date   TSH 1.543 02/03/2018                         Note: Lab results reviewed.  [place]  Drug: Ms. Mcalpine  reports no history of drug use. Alcohol:  reports previous alcohol use. Tobacco:  reports that she has quit smoking. She has a 13.00 pack-year smoking history. She has never used smokeless tobacco. Medical:  has a past medical history of Asthma, Dyspnea (12/2017), GERD (gastroesophageal reflux disease), H/O: GI bleed, Hypertension, Lung mass, Mitral valve prolapse, and Osteoarthritis. Family: family history includes Arthritis in her father; Breast cancer (age of onset: 2) in her sister; Dementia in her mother; Diabetes in her mother; Glaucoma in her mother; Hypertension in her mother; Osteoporosis in her father; Ovarian cancer in her maternal aunt; Prostate cancer in her brother; Prostate cancer (age of onset: 41) in her father; Stroke in her father; Uterine cancer in her paternal aunt.  Past Surgical History:  Procedure Laterality Date  . ABDOMINAL HYSTERECTOMY  2002  . ANTERIOR FUSION CERVICAL SPINE  2009  . COLONOSCOPY  2013   normal  . ELECTROMAGNETIC NAVIGATION BROCHOSCOPY Left 12/12/2017   Procedure: ELECTROMAGNETIC NAVIGATION BRONCHOSCOPY;  Surgeon: Flora Lipps, MD;  Location: [facility] OR[facility];  Service: Cardiopulmonary;  Laterality: Left;  . ESOPHAGOGASTRODUODENOSCOPY  1990   GI bleed  . FLEXIBLE BRONCHOSCOPY Left 12/12/2017   Procedure: FLEXIBLE BRONCHOSCOPY;  Surgeon: Flora Lipps, MD;  Location: [facility];  Service: Cardiopulmonary;  Laterality: Left;  . KNEE ARTHROSCOPY Right 2010   x3  . KNEE SURGERY Left    Arthroscopy x1  . LAPAROSCOPIC SUPRACERVICAL HYSTERECTOMY  2002   with BSO for fibroids/ AUB/ pelvic pain  . LAPAROSCOPIC TUBAL LIGATION  1982  . LUNG BIOPSY    . SHOULDER SURGERY Right    rotator cuff repair  . SHOULDER SURGERY Right 2011   second rotator cuff repair  . tubal reanastamosis  1985   Active Ambulatory Problems    Diagnosis Date Noted  . H/O: GI bleed   . Osteoarthritis   . GERD (gastroesophageal  reflux disease)   . Asthma   . Abnormal weight gain 02/22/2014  . Acute bilateral low back pain with bilateral sciatica 05/16/2014  . Acute pain of left shoulder 03/17/2016  . Anxiety 07/12/2016  . Breast pain, left 05/16/2014  . Cervical pain (neck) 02/22/2014  . Cervical radiculopathy 05/16/2014  . Cervical spondylosis without myelopathy 06/26/2017  . Chest pain 01/13/2015  . Degeneration of lumbar intervertebral disc 12/05/2017  . Fatigue 02/22/2014  . Hair loss 02/22/2014  . Impingement syndrome of left shoulder 03/17/2016  . Left forearm pain  06/08/2014  . Lichen planus 49/67/5916  . Mild intermittent asthma 11/12/2013  . Muscle spasm 02/22/2014  . Primary osteoarthritis of right knee 12/05/2015  . Right hip pain 12/19/2015  . 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 Synovitis of right hand 02/01/2017  . Vitamin D deficiency 07/16/2016  . Lung nodule   . Pneumothorax 12/12/2017  . H/O chest tube placement    Resolved Ambulatory Problems    Diagnosis Date Noted  . No Resolved Ambulatory Problems   Past Medical History:  Diagnosis Date  . Dyspnea 12/2017  . Hypertension   . Lung mass   . Mitral valve prolapse    Constitutional Exam  General appearance: Well nourished, well developed, and well hydrated. In no apparent acute distress There were no vitals filed for this visit. BMI Assessment: Estimated body mass index is 27.21 kg/m as calculated from the following:   Height as of 07/27/18: _0  (1.549 m).   Weight as of 07/27/18: 144 lb (65.3 kg).  BMI interpretation table: BMI level Category Range association with higher incidence of chronic pain  <18 kg/m2 Underweight   18.5-24.9 kg/m2 Ideal body weight   25-29.9 kg/m2 Overweight Increased incidence by 20%  30-34.9 kg/m2 Obese (Class I) Increased incidence by 68%  35-39.9 kg/m2 Severe obesity (Class II) Increased incidence by 136%  >40 kg/m2 Extreme obesity (Class III) Increased incidence by 254%    Patient's current BMI Ideal Body weight  There is no height or weight on file to calculate BMI. Ideal body weight: 47.8 kg (105 lb 6.1 oz) Adjusted ideal body weight: 54.8 kg (120 lb 13.2 oz)   BMI Readings from Last 4 Encounters:  07/27/18 27.21 kg/m  06/09/18 27.09 kg/m  06/07/18 26.45 kg/m  05/09/18 27.78 kg/m   Wt Readings from Last 4 Encounters:  07/27/18 144 lb (65.3 kg)  06/09/18 143 lb 6 oz (65 kg)  06/07/18 140 lb (63.5 kg)  05/09/18 147 lb (66.7 kg)  Psych/Mental status: Alert, oriented x 3 (person, place, & time)       Eyes: PERLA Respiratory: No evidence of acute respiratory distress  Cervical Spine Area Exam  Skin & Axial Inspection: No masses, redness, edema, swelling, or associated skin lesions Alignment: Symmetrical Functional ROM: Unrestricted ROM      Stability: No instability detected Muscle Tone/Strength: Functionally intact. No obvious neuro-muscular anomalies detected. Sensory (Neurological): Unimpaired Palpation: No palpable anomalies              Upper Extremity (UE) Exam    Side: Right upper extremity  Side: Left upper extremity  Skin & Extremity Inspection: Skin color, temperature, and hair growth are WNL. No peripheral edema or cyanosis. No masses, redness, swelling, asymmetry, or associated skin lesions. No contractures.  Skin & Extremity Inspection: Skin color, temperature, and hair growth are WNL. No peripheral edema or cyanosis. No masses, redness, swelling, asymmetry, or associated skin lesions. No contractures.  Functional ROM: Unrestricted ROM          Functional ROM: Unrestricted ROM          Muscle Tone/Strength: Functionally intact. No obvious neuro-muscular anomalies detected.  Muscle Tone/Strength: Functionally intact. No obvious neuro-muscular anomalies detected.  Sensory (Neurological): Unimpaired          Sensory (Neurological): Unimpaired          Palpation: No palpable anomalies              Palpation: No palpable anomalies  Provocative Test(s):  Phalen's test: deferred Tinel's test: deferred Apley's scratch test (touch opposite shoulder):  Action 1 (Across chest): deferred Action 2 (Overhead): deferred Action 3 (LB reach): deferred   Provocative Test(s):  Phalen's test: deferred Tinel's test: deferred Apley's scratch test (touch opposite shoulder):  Action 1 (Across chest): deferred Action 2 (Overhead): deferred Action 3 (LB reach): deferred    Thoracic Spine Area Exam  Skin & Axial Inspection: No masses, redness, or swelling Alignment: Symmetrical Functional ROM: Unrestricted ROM Stability: No instability detected Muscle Tone/Strength: Functionally intact. No obvious neuro-muscular anomalies detected. Sensory (Neurological): Unimpaired Muscle strength & Tone: No palpable anomalies  Lumbar Spine Area Exam  Skin & Axial Inspection: No masses, redness, or swelling Alignment: Symmetrical Functional ROM: Unrestricted ROM       Stability: No instability detected Muscle Tone/Strength: Functionally intact. No obvious neuro-muscular anomalies detected. Sensory (Neurological): Unimpaired Palpation: No palpable anomalies       Provocative Tests: Hyperextension/rotation test: deferred today       Lumbar quadrant test (Kemp's test): deferred today       Lateral bending test: deferred today       Patrick's Maneuver: deferred today                   FABER* test: deferred today                   S-I anterior distraction/compression test: deferred today         S-I lateral compression test: deferred today         S-I Thigh-thrust test: deferred today         S-I Gaenslen's test: deferred today         *(Flexion, ABduction and External Rotation)  Gait & Posture Assessment  Ambulation: Unassisted Gait: Relatively normal for age and body habitus Posture: WNL   Lower Extremity Exam    Side: Right lower extremity  Side: Left lower extremity  Stability: No instability observed          Stability: No  instability observed          Skin & Extremity Inspection: Skin color, temperature, and hair growth are WNL. No peripheral edema or cyanosis. No masses, redness, swelling, asymmetry, or associated skin lesions. No contractures.  Skin & Extremity Inspection: Skin color, temperature, and hair growth are WNL. No peripheral edema or cyanosis. No masses, redness, swelling, asymmetry, or associated skin lesions. No contractures.  Functional ROM: Unrestricted ROM                  Functional ROM: Unrestricted ROM                  Muscle Tone/Strength: Functionally intact. No obvious neuro-muscular anomalies detected.  Muscle Tone/Strength: Functionally intact. No obvious neuro-muscular anomalies detected.  Sensory (Neurological): Unimpaired        Sensory (Neurological): Unimpaired        DTR: Patellar: deferred today Achilles: deferred today Plantar: deferred today  DTR: Patellar: deferred today Achilles: deferred today Plantar: deferred today  Palpation: No palpable anomalies  Palpation: No palpable anomalies   Assessment  Primary Diagnosis & Pertinent Problem List: There were no encounter diagnoses.  Visit Diagnosis (New problems to examiner): No diagnosis found. Plan of Care (Initial workup plan)  Note: Ms. Ilyas was reminded that as per protocol, today's visit has been an evaluation only. We have not taken over the patient's controlled substance management.  Problem-specific plan: No problem-specific Assessment & Plan  notes found for this encounter.  Lab Orders  No laboratory test(s) ordered today   Imaging Orders  No imaging studies ordered today   Referral Orders  No referral(s) requested today   Procedure Orders    No procedure(s) ordered today   Pharmacotherapy (current): Medications ordered:  No orders of the defined types were placed in this encounter.  Medications administered during this visit: Lory Nowaczyk had no medications administered during this visit.    Pharmacological management options:  Opioid Analgesics: The patient was informed that there is no guarantee that she would be a candidate for opioid analgesics. The decision will be made following CDC guidelines. This decision will be based on the results of diagnostic studies, as well as Ms. Sundstrom's risk profile.   Membrane stabilizer: To be determined at a later time  Muscle relaxant: To be determined at a later time  NSAID: To be determined at a later time  Other analgesic(s): To be determined at a later time   Interventional management options: Ms. Lehrman was informed that there is no guarantee that she would be a candidate for interventional therapies. The decision will be based on the results of diagnostic studies, as well as Ms. Biermann's risk profile.  Procedure(s) under consideration:  ***   Provider-requested follow-up: No follow-ups on file.  Future Appointments  Date Time Provider [place]  08/01/2018  2:00 PM Gillis Santa, MD [facility] None    Primary Care Physician: Perrin Maltese, MD Location: [facility] Note by: Gillis Santa, MD Date: 08/01/2018; Time: 1:45 PM  Note: This dictation was prepared with Dragon dictation. Any transcriptional errors that may result from this process are unintentional.

## 2018-08-01 ENCOUNTER — Ambulatory Visit: Payer: Medicare Other | Admitting: Student in an Organized Health Care Education/Training Program

## 2018-08-03 DIAGNOSIS — H9313 Tinnitus, bilateral: Secondary | ICD-10-CM | POA: Insufficient documentation

## 2018-08-16 ENCOUNTER — Ambulatory Visit (INDEPENDENT_AMBULATORY_CARE_PROVIDER_SITE_OTHER): Payer: Medicare Other | Admitting: Internal Medicine

## 2018-08-16 ENCOUNTER — Ambulatory Visit
Admission: RE | Admit: 2018-08-16 | Discharge: 2018-08-16 | Disposition: A | Discharge status: Home / Self Care | Payer: Medicare Other | Source: Ambulatory Visit | Attending: Certified Nurse Midwife | Admitting: Certified Nurse Midwife

## 2018-08-16 ENCOUNTER — Other Ambulatory Visit: Payer: Self-pay

## 2018-08-16 ENCOUNTER — Encounter: Payer: Self-pay | Admitting: Internal Medicine

## 2018-08-16 DIAGNOSIS — J4521 Mild intermittent asthma with (acute) exacerbation: Secondary | ICD-10-CM | POA: Diagnosis not present

## 2018-08-16 DIAGNOSIS — J305 Allergic rhinitis due to food: Secondary | ICD-10-CM | POA: Diagnosis not present

## 2018-08-16 DIAGNOSIS — Z1231 Encounter for screening mammogram for malignant neoplasm of breast: Secondary | ICD-10-CM

## 2018-08-16 MED ORDER — ADVAIR HFA 230-21 MCG/ACT IN AERO: 2.0000 | INHALATION_SPRAY | Freq: Two times a day (BID) | RESPIRATORY_TRACT | 12 refills | Status: DC

## 2018-08-16 MED ORDER — CETIRIZINE HCL 10 MG PO TABS: 10.0000 mg | ORAL_TABLET | Freq: Every day | ORAL | 6 refills | Status: DC

## 2018-08-16 NOTE — Progress Notes (Signed)
   08/16/2018  I connected with the patient by telephone enabled telemedicine visit and verified that I am speaking with the correct person using two identifiers.    I discussed the limitations, risks, security and privacy concerns of performing an evaluation and management service by telemedicine and the availability of in-person appointments. I also discussed with the patient that there may be a patient responsible charge related to this service. The patient expressed understanding and agreed to proceed.  PATIENT AGREES AND CONFIRMS -YES   Other persons participating in the visit and their role in the encounter: Patient, nursing   Patient's location: Home Provider's location: Clinic   I discussed the limitations, risks, security and privacy concerns of performing an evaluation and management service by telephone and the availability of in person appointments. I also discussed with the patient that there may be a patient responsible charge related to this service. The patient expressed understanding and agreed to proceed.  This visit type was conducted due to national recommendations for restrictions regarding the COVID-19 Pandemic (e.g. social distancing).  This format is felt to be most appropriate for this patient at this time.  All issues noted in this document were discussed and addressed.       Due to COVID restrictions, I am unable to examine patient and patients understands the risks and benefits.  CC Follow up ASTHMA Dx of H pyelori  HPI +asthma exacerbation On ABX for h. pyelori on flagyl and tetracycline  Using inhaler more frequently With Ventolin   +wheezing No fevers No pain +cough  Pulse ox 100% on RA    Review of Systems:  Gen:  Denies  fever, sweats, chills weight loss  HEENT: Denies blurred vision, double vision, ear pain, eye pain, hearing loss, nose bleeds, sore throat Cardiac:  No dizziness, chest pain or heaviness, chest tightness,edema, No  JVD Resp:   + cough, +sputum production, +shortness of breath,+wheezing, -hemoptysis,  Gi: Denies swallowing difficulty, stomach pain, nausea or vomiting, diarrhea, constipation, bowel incontinence Gu:  Denies bladder incontinence, burning urine Ext:   Denies Joint pain, stiffness or swelling Skin: Denies  skin rash, easy bruising or bleeding or hives Endoc:  Denies polyuria, polydipsia , polyphagia or weight change Psych:   Denies depression, insomnia or hallucinations  Other:  All other systems negative   A/P ASTHMA EXACERBATION Ventolin as needed NEB therapy as needed incentive spirometry as needed Avoid CATS/DOGS Start ADVAIR HFA 230 Will NOT prescribe prednisone at this time   Allergic Rhinitis Start Zyrtec 10 mg at night  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   Follow up in 3 months.  Total Time Spent 24 mins    COVID-19 EDUCATION: The signs and symptoms of COVID-19 were discussed with the patient and how to seek care for testing.  The importance of social distancing was discussed today. Hand Washing Techniques and avoid touching face was advised.  MEDICATION ADJUSTMENTS/LABS AND TESTS ORDERED: Start ADVAIR HFA 230 Continue albuterol as needed Start Zyrtec 10 mg daily  CURRENT MEDICATIONS REVIEWED AT LENGTH WITH PATIENT TODAY   Patient satisfied with Plan of action and management. All questions answered  Follow up in 6 months   Anael Rosch Patricia Pesa, M.D.  [facility]  Medical Director [place] Director [facility] Cardio-Pulmonary Department

## 2018-08-16 NOTE — Patient Instructions (Signed)
Start ADVAIR HFA-rinse mouth out after every use  START ZYRTEC 10 mg at night

## 2018-08-17 ENCOUNTER — Telehealth: Payer: Self-pay | Admitting: Internal Medicine

## 2018-08-17 NOTE — Telephone Encounter (Signed)
Pt advised per Dr.Kasa's note Zyrtec and Advair HFA were sent to pharmacy. Pt verbalized understanding. No further actions necessary at this time.

## 2018-08-28 ENCOUNTER — Other Ambulatory Visit: Payer: Self-pay

## 2018-08-28 ENCOUNTER — Encounter: Payer: Self-pay | Admitting: Emergency Medicine

## 2018-08-28 ENCOUNTER — Emergency Department
Admission: EM | Admit: 2018-08-28 | Discharge: 2018-08-28 | Disposition: A | Discharge status: Home / Self Care | Payer: Medicare Other | Attending: Emergency Medicine | Admitting: Emergency Medicine

## 2018-08-28 ENCOUNTER — Emergency Department: Payer: Medicare Other

## 2018-08-28 DIAGNOSIS — Z139 Encounter for screening, unspecified: Secondary | ICD-10-CM | POA: Diagnosis not present

## 2018-08-28 DIAGNOSIS — J45909 Unspecified asthma, uncomplicated: Secondary | ICD-10-CM | POA: Insufficient documentation

## 2018-08-28 DIAGNOSIS — Z87891 Personal history of nicotine dependence: Secondary | ICD-10-CM | POA: Insufficient documentation

## 2018-08-28 DIAGNOSIS — Z91040 Latex allergy status: Secondary | ICD-10-CM | POA: Diagnosis not present

## 2018-08-28 DIAGNOSIS — I10 Essential (primary) hypertension: Secondary | ICD-10-CM | POA: Insufficient documentation

## 2018-08-28 DIAGNOSIS — R195 Other fecal abnormalities: Secondary | ICD-10-CM | POA: Diagnosis present

## 2018-08-28 LAB — COMPREHENSIVE METABOLIC PANEL
ALT: 32 U/L (ref 0–44)
AST: 38 U/L (ref 15–41)
Albumin: 3.9 g/dL (ref 3.5–5.0)
Alkaline Phosphatase: 70 U/L (ref 38–126)
Anion gap: 8 (ref 5–15)
BUN: 6 mg/dL (ref 6–20)
CO2: 25 mmol/L (ref 22–32)
Calcium: 9.1 mg/dL (ref 8.9–10.3)
Chloride: 105 mmol/L (ref 98–111)
Creatinine, Ser: 0.65 mg/dL (ref 0.44–1.00)
GFR calc Af Amer: >60 mL/min (ref >60)
GFR calc non Af Amer: >60 mL/min (ref >60)
Glucose, Bld: 88 mg/dL (ref 70–99)
Potassium: 4.7 mmol/L (ref 3.5–5.1)
Sodium: 138 mmol/L (ref 135–145)
Total Bilirubin: 0.7 mg/dL (ref 0.3–1.2)
Total Protein: 6.8 g/dL (ref 6.5–8.1)

## 2018-08-28 LAB — CBC WITH DIFFERENTIAL/PLATELET
Abs Immature Granulocytes: 0.03 10*3/uL (ref 0.00–0.07)
Basophils Absolute: 0.1 10*3/uL (ref 0.0–0.1)
Basophils Relative: 1 %
Eosinophils Absolute: 0.3 10*3/uL (ref 0.0–0.5)
Eosinophils Relative: 4 %
HCT: 37.7 % (ref 36.0–46.0)
Hemoglobin: 12.1 g/dL (ref 12.0–15.0)
Immature Granulocytes: 0 %
Lymphocytes Relative: 25 %
Lymphs Abs: 1.9 10*3/uL (ref 0.7–4.0)
MCH: 30.2 pg (ref 26.0–34.0)
MCHC: 32.1 g/dL (ref 30.0–36.0)
MCV: 94 fL (ref 80.0–100.0)
Monocytes Absolute: 0.5 10*3/uL (ref 0.1–1.0)
Monocytes Relative: 6 %
Neutro Abs: 5 10*3/uL (ref 1.7–7.7)
Neutrophils Relative %: 64 %
Platelets: 341 10*3/uL (ref 150–400)
RBC: 4.01 MIL/uL (ref 3.87–5.11)
RDW: 12.6 % (ref 11.5–15.5)
WBC: 7.8 10*3/uL (ref 4.0–10.5)
nRBC: 0 % (ref 0.0–0.2)

## 2018-08-28 LAB — TYPE AND SCREEN
ABO/RH(D): O POS
Antibody Screen: NEGATIVE

## 2018-08-28 MED ORDER — SODIUM CHLORIDE 0.9% FLUSH: 3.0000 mL | Freq: Once | INTRAVENOUS | Status: DC

## 2018-08-28 NOTE — ED Provider Notes (Signed)
[facility] Emergency Department Provider Note       Time seen: ----------------------------------------- 2:31 PM on 08/28/2018 -----------------------------------------  I have reviewed the triage vital signs and the nursing notes.  HISTORY   Chief Complaint Blood In Stools and Weakness   HPI Carmen Grimes is a 58 y.o. female with a history of asthma, GERD, hypertension, lung mass who presents to the ED for black stools.  Patient states she has taken the standard therapy for H. pylori.  She was having some upper abdominal pain in July and was placed on appropriate medications for H. pylori.  She finished this treatment about a week ago, is having some black stools.  She is also had some shortness of breath.  Her doctor sent her here for further evaluation.  Past Medical History:  Diagnosis Date  . Asthma    controlled  . Dyspnea 12/2017   difficulty getting her breath at all times  . GERD (gastroesophageal reflux disease)   . H/O: GI bleed   . Hypertension   . Lung mass   . Mitral valve prolapse   . Osteoarthritis    knees    Patient Active Problem List   Diagnosis Date Noted  . H/O chest tube placement   . Pneumothorax 12/12/2017  . Lung nodule   . Degeneration of lumbar intervertebral disc 12/05/2017  . Cervical spondylosis without myelopathy 06/26/2017  . 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 Synovitis of right hand 02/01/2017  . H/O: GI bleed   . Osteoarthritis   . GERD (gastroesophageal reflux disease)   . Asthma   . Vitamin D deficiency 07/16/2016  . Anxiety 07/12/2016  . Acute pain of left shoulder 03/17/2016  . Impingement syndrome of left shoulder 03/17/2016  . Right hip pain 12/19/2015  . Primary osteoarthritis of right knee 12/05/2015  . Lichen planus [id number]/[id number]  . Chest pain 01/13/2015  . Left forearm pain 06/08/2014  . Acute bilateral low back pain with bilateral sciatica 05/16/2014  . Breast pain, left  05/16/2014  . Cervical radiculopathy 05/16/2014  . Abnormal weight gain 02/22/2014  . Cervical pain (neck) 02/22/2014  . Fatigue 02/22/2014  . Hair loss 02/22/2014  . Muscle spasm 02/22/2014  . Mild intermittent asthma 11/12/2013    Past Surgical History:  Procedure Laterality Date  . ABDOMINAL HYSTERECTOMY  2002  . ANTERIOR FUSION CERVICAL SPINE  2009  . BREAST CYST ASPIRATION Right   . COLONOSCOPY  2013   normal  . ELECTROMAGNETIC NAVIGATION BROCHOSCOPY Left 12/12/2017   Procedure: ELECTROMAGNETIC NAVIGATION BRONCHOSCOPY;  Surgeon: Flora Lipps, MD;  Location: [facility] ORS;  Service: Cardiopulmonary;  Laterality: Left;  . ESOPHAGOGASTRODUODENOSCOPY  1990   GI bleed  . FLEXIBLE BRONCHOSCOPY Left 12/12/2017   Procedure: FLEXIBLE BRONCHOSCOPY;  Surgeon: Flora Lipps, MD;  Location: [facility] ORS;  Service: Cardiopulmonary;  Laterality: Left;  . KNEE ARTHROSCOPY Right 2010   x3  . KNEE SURGERY Left    Arthroscopy x1  . LAPAROSCOPIC SUPRACERVICAL HYSTERECTOMY  2002   with BSO for fibroids/ AUB/ pelvic pain  . LAPAROSCOPIC TUBAL LIGATION  1982  . LUNG BIOPSY    . SHOULDER SURGERY Right    rotator cuff repair  . SHOULDER SURGERY Right 2011   second rotator cuff repair  . tubal reanastamosis  1985    Allergies Shellfish allergy, Tape, Tramadol, Celecoxib, Gabapentin, Hydrocodone, Hydrocodone-acetaminophen, Ibuprofen, Naproxen, Prednisone, Singulair [montelukast], Strawberry extract, Codeine, Doxycycline, Eggs or egg-derived products, Latex, Nsaids, Strawberry flavor, and Sulfamethoxazole-trimethoprim  Social History Social History  Tobacco Use  . Smoking status: Former Smoker    Packs/day: 1.00    Years: 13.00    Pack years: 13.00  . Smokeless tobacco: Never Used  Substance Use Topics  . Alcohol use: Not Currently  . Drug use: No   Review of Systems Constitutional: Negative for fever. Cardiovascular: Negative for chest pain. Respiratory: Positive for shortness of  breath Gastrointestinal: Negative for abdominal pain, vomiting and diarrhea.  Positive for black stools Musculoskeletal: Negative for back pain. Skin: Negative for rash. Neurological: Negative for headaches, focal weakness or numbness.  All systems negative/normal/unremarkable except as stated in the HPI  ____________________________________________   PHYSICAL EXAM:  VITAL SIGNS: ED Triage Vitals  Enc Vitals Group     BP 08/28/18 1239 (!) 142/78     Pulse Rate 08/28/18 1239 72     Resp 08/28/18 1239 18     Temp 08/28/18 1239 98.4 F (36.9 C)     Temp Source 08/28/18 1239 Oral     SpO2 08/28/18 1239 99 %     Weight 08/28/18 1240 138 lb (62.6 kg)     Height 08/28/18 1240 _0  (1.549 m)     Head Circumference --      Peak Flow --      Pain Score 08/28/18 1239 0     Pain Loc --      Pain Edu? --      Excl. in [place]? --    Constitutional: Alert and oriented. Well appearing and in no distress. Eyes: Conjunctivae are normal. Normal extraocular movements. Cardiovascular: Normal rate, regular rhythm. No murmurs, rubs, or gallops. Respiratory: Normal respiratory effort without tachypnea nor retractions. Breath sounds are clear and equal bilaterally. No wheezes/rales/rhonchi. Gastrointestinal: Soft and nontender. Normal bowel sounds Rectal: Heme-negative stool, no hemorrhoids, nontender Musculoskeletal: Nontender with normal range of motion in extremities. No lower extremity tenderness nor edema. Neurologic:  Normal speech and language. No gross focal neurologic deficits are appreciated.  Skin:  Skin is warm, dry and intact. No rash noted. Psychiatric: Mood and affect are normal. Speech and behavior are normal.  ____________________________________________  ED COURSE:  As part of my medical decision making, I reviewed the following data within the [place] History obtained from family if available, nursing notes, old chart and ekg, as well as notes from prior ED  visits. Patient presented for passing black stool, we will assess with labs as indicated at this time.   Procedures  Carmen Grimes was evaluated in Emergency Department on 08/28/2018 for the symptoms described in the history of present illness. She was evaluated in the context of the global COVID-19 pandemic, which necessitated consideration that the patient might be at risk for infection with the SARS-CoV-2 virus that causes COVID-19. Institutional protocols and algorithms that pertain to the evaluation of patients at risk for COVID-19 are in a state of rapid change based on information released by regulatory bodies including the CDC and federal and state organizations. These policies and algorithms were followed during the patient's care in the ED.  ____________________________________________   LABS (pertinent positives/negatives)  Labs Reviewed  CBC WITH DIFFERENTIAL/PLATELET  COMPREHENSIVE METABOLIC PANEL  URINALYSIS, COMPLETE (UACMP) WITH MICROSCOPIC  TYPE AND SCREEN  ____________________________________________   DIFFERENTIAL DIAGNOSIS   Occult GI bleeding, melena, medication side effect, anemia, lung cancer  FINAL ASSESSMENT AND PLAN  Medical screening exam   Plan: The patient had presented for concerns for passing black stools. Patient's labs were reassuring, no evidence of anemia. Patient's  imaging did not reveal any acute process.  She was heme-negative here and I think this is likely medication related.  She has been encouraged to take probiotics and hold her Nexium to see if this resolves.  She is cleared for outpatient follow-up.   Laurence Aly, MD    Note: This note was generated in part or whole with voice recognition software. Voice recognition is usually quite accurate but there are transcription errors that can and very often do occur. I apologize for any typographical errors that were not detected and corrected.     Earleen Newport, MD 08/28/18  [phone]

## 2018-08-28 NOTE — ED Notes (Signed)
This RN attemtped to draw blood from patient. Pt refused to let this RN draw blood from any other site other than R AC, continuously attempting to interfere with this RN drawing blood. Pt moving during straight stick. Pt initially refusing to let this RN straight stick, insisting on IV then sitting back in waiting room, this RN spoke with first RN and explained to patient no indication for IV and against policy for patient to go to main waiting room with IV. Lab called to stick patient for blood work at this time.

## 2018-08-28 NOTE — ED Notes (Signed)
Dr. Jimmye Norman at bedside for rectal exam. PT A&Ox4 and in NAD

## 2018-08-28 NOTE — ED Triage Notes (Signed)
Says took course of antibiotics for h pylori--felt weak from that, but then started having black stools an some shortness of breath.  Says several stools this am.  Her doctor sent her here.

## 2018-08-28 NOTE — ED Triage Notes (Signed)
See note from 1st RN. Pt states same to this RN. Pt also c/o nausea that started today. Pt also c/o exertional SOB and weakness.

## 2018-08-28 NOTE — ED Notes (Signed)
Pt otf for imaging 

## 2018-10-23 ENCOUNTER — Ambulatory Visit: Admit: 2018-10-23 | Payer: Medicare Other | Admitting: Internal Medicine

## 2018-10-23 SURGERY — ESOPHAGOGASTRODUODENOSCOPY (EGD) WITH PROPOFOL
Anesthesia: General

## 2018-11-13 ENCOUNTER — Telehealth: Payer: Self-pay | Admitting: Internal Medicine

## 2018-11-13 ENCOUNTER — Other Ambulatory Visit: Payer: Self-pay

## 2018-11-13 DIAGNOSIS — R079 Chest pain, unspecified: Secondary | ICD-10-CM | POA: Diagnosis present

## 2018-11-13 DIAGNOSIS — Z5321 Procedure and treatment not carried out due to patient leaving prior to being seen by health care provider: Secondary | ICD-10-CM | POA: Diagnosis not present

## 2018-11-13 NOTE — Telephone Encounter (Signed)
Called and spoke to pt.  Pt reports of increased sob X4d. Pt using albuterol 1-2 times daily and flovent BID with no relief in sx. Not currently using any OTC. Pt denied fever, chills or sweats.  Pt has been scheduled for acute visit tomorrow with Tammy Parrett. Pt declined virtual visit.  Nothing further is needed.

## 2018-11-14 ENCOUNTER — Emergency Department
Admission: EM | Admit: 2018-11-14 | Discharge: 2018-11-14 | Disposition: A | Discharge status: Home / Self Care | Payer: Medicare Other | Attending: Obstetrics and Gynecology | Admitting: Obstetrics and Gynecology

## 2018-11-14 ENCOUNTER — Other Ambulatory Visit: Payer: Self-pay

## 2018-11-14 ENCOUNTER — Ambulatory Visit: Payer: Medicare Other | Admitting: Adult Health

## 2018-11-14 ENCOUNTER — Telehealth: Payer: Self-pay | Admitting: Adult Health

## 2018-11-14 ENCOUNTER — Emergency Department: Payer: Medicare Other

## 2018-11-14 DIAGNOSIS — R079 Chest pain, unspecified: Secondary | ICD-10-CM | POA: Diagnosis not present

## 2018-11-14 LAB — BASIC METABOLIC PANEL
Anion gap: 10 (ref 5–15)
BUN: 10 mg/dL (ref 6–20)
CO2: 26 mmol/L (ref 22–32)
Calcium: 9.8 mg/dL (ref 8.9–10.3)
Chloride: 102 mmol/L (ref 98–111)
Creatinine, Ser: 0.74 mg/dL (ref 0.44–1.00)
GFR calc Af Amer: >60 mL/min (ref >60)
GFR calc non Af Amer: >60 mL/min (ref >60)
Glucose, Bld: 98 mg/dL (ref 70–99)
Potassium: 4.7 mmol/L (ref 3.5–5.1)
Sodium: 138 mmol/L (ref 135–145)

## 2018-11-14 LAB — CBC
HCT: 39 % (ref 36.0–46.0)
Hemoglobin: 12.8 g/dL (ref 12.0–15.0)
MCH: 30.4 pg (ref 26.0–34.0)
MCHC: 32.8 g/dL (ref 30.0–36.0)
MCV: 92.6 fL (ref 80.0–100.0)
Platelets: 340 10*3/uL (ref 150–400)
RBC: 4.21 MIL/uL (ref 3.87–5.11)
RDW: 12.4 % (ref 11.5–15.5)
WBC: 9.7 10*3/uL (ref 4.0–10.5)
nRBC: 0 % (ref 0.0–0.2)

## 2018-11-14 LAB — TROPONIN I (HIGH SENSITIVITY): Troponin I (High Sensitivity): 3 ng/L (ref <18)

## 2018-11-14 NOTE — ED Triage Notes (Signed)
Pt to the er for pain to the right side rib cage that runs in the front of the pt to the middle of chest and around the back . Pt is tearful in triage. Pt states her rib cage feels like its on fire. Pt says pain woke her up. Pt denies injury.

## 2018-11-14 NOTE — Telephone Encounter (Signed)
ATC pt to obtain CC and review medications prior to 4:00 phone visit with TP.  I have left a message on both home and mobile number listed on file.  Per pt's chart, it appears that pt is currently at ED being evaluated for chest pain.  No showed apt as instructed by Rexene Edison, NP.

## 2018-11-15 ENCOUNTER — Telehealth: Payer: Self-pay | Admitting: Internal Medicine

## 2018-11-15 NOTE — Telephone Encounter (Signed)
Called and spoke to pt.  Pt stated she seen a allergist last Thursday, and had allergy test. Pt stated that the next day she developed increased sob and felt the need to use her albuterol inhaler more. Pt is using albuterol TID.   No additional symptoms.   Pt also stated that allergist switched her from advair to [place]. Pt is questioning the difference in inhalers. Offered phone or virtual with Dr. Mortimer Fries on 11/16/2018. Pt declined visit due to work schedule.    DK please advise. Thanks

## 2018-11-16 NOTE — Telephone Encounter (Signed)
Switch back to advair as previously poresxcribed

## 2018-11-16 NOTE — Telephone Encounter (Signed)
Left message to relay below recommendations.

## 2018-11-16 NOTE — Telephone Encounter (Signed)
Pt is aware of below recommendations and voiced her understanding. Nothing further is needed.  

## 2018-12-26 ENCOUNTER — Other Ambulatory Visit: Payer: Self-pay | Admitting: Family

## 2018-12-26 DIAGNOSIS — J449 Chronic obstructive pulmonary disease, unspecified: Secondary | ICD-10-CM

## 2018-12-26 DIAGNOSIS — R1310 Dysphagia, unspecified: Secondary | ICD-10-CM

## 2019-01-11 ENCOUNTER — Ambulatory Visit
Admission: RE | Admit: 2019-01-11 | Discharge: 2019-01-11 | Disposition: A | Discharge status: Home / Self Care | Payer: Medicare Other | Source: Ambulatory Visit | Attending: Family | Admitting: Family

## 2019-01-11 ENCOUNTER — Other Ambulatory Visit: Payer: Self-pay

## 2019-01-11 DIAGNOSIS — R1310 Dysphagia, unspecified: Secondary | ICD-10-CM

## 2019-01-11 DIAGNOSIS — J449 Chronic obstructive pulmonary disease, unspecified: Secondary | ICD-10-CM | POA: Diagnosis present

## 2019-01-15 ENCOUNTER — Telehealth: Payer: Self-pay | Admitting: Certified Nurse Midwife

## 2019-01-15 ENCOUNTER — Other Ambulatory Visit: Payer: Self-pay

## 2019-01-15 ENCOUNTER — Ambulatory Visit (INDEPENDENT_AMBULATORY_CARE_PROVIDER_SITE_OTHER): Payer: Medicare Other | Admitting: Certified Nurse Midwife

## 2019-01-15 ENCOUNTER — Encounter: Payer: Self-pay | Admitting: Certified Nurse Midwife

## 2019-01-15 VITALS — BP 110/80 | HR 72 | Ht 61.0 in | Wt 162.0 lb

## 2019-01-15 DIAGNOSIS — R1900 Intra-abdominal and pelvic swelling, mass and lump, unspecified site: Secondary | ICD-10-CM | POA: Diagnosis not present

## 2019-01-15 NOTE — Progress Notes (Signed)
Obstetrics & Gynecology Office Visit   Chief Complaint:  Chief Complaint  Patient presents with  . Gynecologic Exam    [facility] told her she has a mass in her uterus; no sxs    History of Present Illness: 59 year old BF with history of LSH and BSO presents for an evaluation for a possible pelvic mass after having a CT scan in November which showed the following:    Multicystic structure at the expected location of the uterus. If patient has not had a hysterectomy then this could represent an atrophic uterus with either degenerating fibroids or cystic changes of endometrium. If patient has had a supracervical hysterectomy then this could represent an enlarged heterogeneous cervix with multiple nabothian cysts. Recommend clinical correlation with outpatient OBGYN follow-up and pelvic ultrasound.  She denies any pelvic pain. Is not sexually active. No vaginal spotting Review of Systems:  ROS   Past Medical History:  Past Medical History:  Diagnosis Date  . Asthma    controlled  . Dyspnea 12/2017   difficulty getting her breath at all times  . GERD (gastroesophageal reflux disease)   . H/O: GI bleed   . Hypertension   . Lung mass   . Mitral valve prolapse   . Osteoarthritis    knees    Past Surgical History:  Past Surgical History:  Procedure Laterality Date  . ABDOMINAL HYSTERECTOMY  2002  . ANTERIOR FUSION CERVICAL SPINE  2009  . BREAST CYST ASPIRATION Right   . COLONOSCOPY  2013   normal  . ELECTROMAGNETIC NAVIGATION BROCHOSCOPY Left 12/12/2017   Procedure: ELECTROMAGNETIC NAVIGATION BRONCHOSCOPY;  Surgeon: Flora Lipps, MD;  Location: [facility] ORS;  Service: Cardiopulmonary;  Laterality: Left;  . ESOPHAGOGASTRODUODENOSCOPY  1990   GI bleed  . FLEXIBLE BRONCHOSCOPY Left 12/12/2017   Procedure: FLEXIBLE BRONCHOSCOPY;  Surgeon: Flora Lipps, MD;  Location: [facility] ORS;  Service: Cardiopulmonary;  Laterality: Left;  . KNEE ARTHROSCOPY Right 2010   x3  . KNEE SURGERY Left      Arthroscopy x1  . LAPAROSCOPIC SUPRACERVICAL HYSTERECTOMY  2002   with BSO for fibroids/ AUB/ pelvic pain  . LAPAROSCOPIC TUBAL LIGATION  1982  . LUNG BIOPSY    . SHOULDER SURGERY Right    rotator cuff repair  . SHOULDER SURGERY Right 2011   second rotator cuff repair  . tubal reanastamosis  1985    Gynecologic History: No LMP recorded. Patient has had a hysterectomy.  Obstetric History: [id number]  Family History:  Family History  Problem Relation Age of Onset  . Diabetes Mother   . Dementia Mother        Alzheimers  . Hypertension Mother   . Glaucoma Mother   . Prostate cancer Father 45  . Stroke Father   . Arthritis Father   . Osteoporosis Father   . Prostate cancer Brother   . Breast cancer Sister 8  . Uterine cancer Paternal Aunt        1 of 4 paternal aunts  . Ovarian cancer Maternal Aunt        1 of 5 maternal aunts    Social History:  Social History   Socioeconomic History  . Marital status: Divorced    Spouse name: Not on file  . Number of children: 3  . Years of education: Not on file  . Highest education level: Not on file  Occupational History  . Occupation: CMA and phlebotomist    Comment: not working but would like to  Tobacco Use  . Smoking status: Former Smoker    Packs/day: 1.00    Years: 13.00    Pack years: 13.00  . Smokeless tobacco: Never Used  Substance and Sexual Activity  . Alcohol use: Not Currently  . Drug use: No  . Sexual activity: Not Currently    Birth control/protection: Post-menopausal  Other Topics Concern  . Not on file  Social History Narrative   Independent at baseline.  Lives at home by herself   Social Determinants of Health   Financial Resource Strain:   . Difficulty of Paying Living Expenses: Not on file  Food Insecurity:   . Worried About Charity fundraiser in the Last Year: Not on file  . Ran Out of Food in the Last Year: Not on file  Transportation Needs:   . Lack of Transportation (Medical): Not on  file  . Lack of Transportation (Non-Medical): Not on file  Physical Activity:   . Days of Exercise per Week: Not on file  . Minutes of Exercise per Session: Not on file  Stress:   . Feeling of Stress : Not on file  Social Connections:   . Frequency of Communication with Friends and Family: Not on file  . Frequency of Social Gatherings with Friends and Family: Not on file  . Attends Religious Services: Not on file  . Active Member of Clubs or Organizations: Not on file  . Attends Archivist Meetings: Not on file  . Marital Status: Not on file  Intimate Partner Violence:   . Fear of Current or Ex-Partner: Not on file  . Emotionally Abused: Not on file  . Physically Abused: Not on file  . Sexually Abused: Not on file    Allergies:  Allergies  Allergen Reactions  . Shellfish Allergy Anaphylaxis    Other reaction(s): anaphylaxis/angioedema Other reaction(s): anaphylaxis/angioedema  Betadine on skin is okay  . Tape Dermatitis    Patient stated Tegaderm after surgery as well as a clear tape tear off her top skin layer and leave her raw. Was told to notify team allergy to tape.   . Tramadol Other (See Comments)    "blacked out", found wandering in the street  . Celecoxib Other (See Comments)    GI bleed Other reaction(s): gi distress, Other (comments), Other (See Comments) GI BLEED      GI bleeding  GI BLEED  GI Bleeding   GI bleed Other reaction(s): gi distress, Other (comments), Other (See Comments) GI BLEED  . Gabapentin Other (See Comments)    Drowsiness. Like a zombie, not functional   . Hydrocodone Other (See Comments)    Hair falls out.  Marland Kitchen Hydrocodone-Acetaminophen     Other reaction(s): other/intolerance HAIR FALLS OUT   . Ibuprofen Other (See Comments)    "Blood disorder", GI bleed. Other reaction(s): gi distress, Other (comments), Other (See Comments)   . Naproxen Other (See Comments)    GI bleed Other reaction(s): Other (comments),  Other (See Comments)        . Prednisone Other (See Comments)    Tremors, severe  . Singulair [Montelukast] Other (See Comments)    Syncope..passed out  . Strawberry Extract Hives, Nausea And Vomiting and Nausea Only    Severe vomitting  . Codeine Itching and Nausea Only    Other reaction(s): mild rash/itching  . Doxycycline Other (See Comments)    Unknown reaction. Other reaction(s): Other (See Comments), Unknown, Unknown (comments) Pt does not remember   . Eggs Or Egg-Derived  Products Rash and Other (See Comments)    rash  . Latex Rash and Itching    Other reaction(s): mild rash/itching  . Nsaids Nausea And Vomiting and Other (See Comments)    Gastric bleed        . Strawberry Flavor Hives  . Sulfamethoxazole-Trimethoprim Other (See Comments)    Other reaction(s): Other (See Comments), other/intolerance MAKES PAIN WORSE  Increases pain Increases pain     Medications: Prior to Admission medications   Medication Sig Start Date End Date Taking? Authorizing Provider  Ascorbic Acid (VITAMIN C) 1000 MG tablet Take 1,000 mg by mouth daily.    [provider]  b complex vitamins capsule Take 1 capsule by mouth daily.    [provider]  Biotin 10000 MCG TABS Take 1 tablet by mouth daily.    [provider]  carvedilol (COREG) 12.5 MG tablet Take 12.5 mg by mouth 2 (two) times daily.    [provider]  cetirizine (ZYRTEC ALLERGY) 10 MG tablet Take 1 tablet (10 mg total) by mouth daily. 08/16/18   Flora Lipps, MD  cholecalciferol (VITAMIN D3) 25 MCG (1000 UT) tablet Take 5,000 Units by mouth daily.    [provider]  estradiol (ESTRACE) 2 MG tablet Take 1 tablet (2 mg total) by mouth daily. 04/03/18   Dalia Heading, CNM  fluticasone-salmeterol (ADVAIR HFA) [phone] MCG/ACT inhaler Inhale 2 puffs into the lungs 2 (two) times daily. 08/16/18   Flora Lipps, MD  LECITHIN PO Take by mouth every other day.    [provider]  oxyCODONE (OXY IR/ROXICODONE) 5 MG immediate release tablet Take 5 mg by mouth as needed.    [provider]  VENTOLIN HFA 108 (90 Base) MCG/ACT inhaler Inhale 2 puffs into the lungs every 6 (six) hours as needed for wheezing or shortness of breath.  11/14/17   [provider]  zinc gluconate 50 MG tablet Take 50 mg by mouth daily.    [provider]    Physical Exam Vitals: BP 110/80   Pulse 72   Ht _0  (1.549 m)   Wt 162 lb (73.5 kg)   BMI 30.61 kg/m      Physical Exam  Constitutional: She is oriented to person, place, and time. She appears well-developed and well-nourished.  GI: Soft. She exhibits no distension and no mass. There is no abdominal tenderness. There is no guarding.  Genitourinary:    Genitourinary Comments: Vulva: no lesions or inflammation Vagina: no bleeding, no masses Cervix: no lesions Uterus: surgically absent Adnexa: right without masses and NT; left with ? Mass vs stool in colon, NT   Neurological: She is alert and oriented to person, place, and time.  Psychiatric: She has a normal mood and affect.     Assessment: 59 y.o. G3P3003 with possible pelvic mass s/p LSH and BSO  Plan: Pelvic ultrasound ordered Will follow up with her after ultrasound.   Dalia Heading, CNM

## 2019-01-15 NOTE — Telephone Encounter (Signed)
Patient is schedule for 01/15/19 for ultrasound. Please place order. Thank you

## 2019-01-15 NOTE — Telephone Encounter (Signed)
done

## 2019-01-16 ENCOUNTER — Encounter: Payer: Self-pay | Admitting: Certified Nurse Midwife

## 2019-01-16 ENCOUNTER — Ambulatory Visit (INDEPENDENT_AMBULATORY_CARE_PROVIDER_SITE_OTHER): Payer: Medicare Other

## 2019-01-16 ENCOUNTER — Other Ambulatory Visit: Payer: Self-pay

## 2019-01-16 ENCOUNTER — Ambulatory Visit (INDEPENDENT_AMBULATORY_CARE_PROVIDER_SITE_OTHER): Payer: Medicare Other | Admitting: Certified Nurse Midwife

## 2019-01-16 VITALS — BP 120/70 | HR 72 | Ht 61.0 in | Wt 162.0 lb

## 2019-01-16 DIAGNOSIS — N888 Other specified noninflammatory disorders of cervix uteri: Secondary | ICD-10-CM | POA: Diagnosis not present

## 2019-01-16 DIAGNOSIS — R1900 Intra-abdominal and pelvic swelling, mass and lump, unspecified site: Secondary | ICD-10-CM

## 2019-01-16 NOTE — Progress Notes (Signed)
  HPI: 59 year old BF with history of LSH and BSO presents after a pelvic ultrasound that was ordered to evaluate for a possible pelvic mass after having a CT scan in November which showed the following:    Multicystic structure at the expected location of the uterus. If patient has not had a hysterectomy then this could represent an atrophic uterus with either degenerating fibroids or cystic changes of endometrium. If patient has had a supracervical hysterectomy then this could represent an enlarged heterogeneous cervix with multiple nabothian cysts. Recommend clinical correlation with outpatient OBGYN follow-up and pelvic ultrasound.  There was a possible left adnexal mass palpated when she presented yesterday   Ultrasound today demonstrates :The uterus is absent. The cervix measures 2.5 cm Right Ovary is absent. Left Ovary is absent.  Survey of the adnexa demonstrates no adnexal masses. There is no free fluid in the cul de sac.  Impression: 1. There are numerous nabothian cysts in the cervix measuring 1.0 cm or less.  2. The uterus and ovaries are absent.  3. There is no free fluid or adnexa masses seen.    PMHx: She  has a past medical history of Asthma, Dyspnea (12/2017), GERD (gastroesophageal reflux disease), H/O: GI bleed, Hiatal hernia, Hypertension, Lung mass, Mitral valve prolapse, Osteoarthritis, and Stomach ulcer. Also,  has a past surgical history that includes Knee arthroscopy (Right, 2010); Shoulder surgery (Right); Knee surgery (Left); Shoulder surgery (Right, 2011); Laparoscopic tubal ligation (1982); tubal reanastamosis (1985); Laparoscopic supracervical hysterectomy (2002); Anterior fusion cervical spine (2009); Esophagogastroduodenoscopy (1990); Colonoscopy (2013); Abdominal hysterectomy (2002); Flexible bronchoscopy (Left, 12/12/2017); Electormagnetic navigation bronchoscopy (Left, 12/12/2017); Lung biopsy; and Breast cyst aspiration (Right)., family history includes  Arthritis in her father; Breast cancer (age of onset: 89) in her sister; Dementia in her mother; Diabetes in her mother; Glaucoma in her mother; Hypertension in her mother; Osteoporosis in her father; Ovarian cancer in her maternal aunt; Prostate cancer in her brother; Prostate cancer (age of onset: 88) in her father; Stroke in her father; Uterine cancer in her paternal aunt.,  reports that she has quit smoking. She has a 13.00 pack-year smoking history. She has never used smokeless tobacco. She reports previous alcohol use. She reports that she does not use drugs.  She has a current medication list which includes the following prescription(s): alum & mag hydroxide-simeth, budesonide-formoterol, estradiol, flovent hfa, advair hfa, oxycodone, ventolin hfa, and oxycodone. Also, is allergic to shellfish allergy; tape; tramadol; celecoxib; gabapentin; hydrocodone; hydrocodone-acetaminophen; ibuprofen; naproxen; prednisone; singulair [montelukast]; strawberry extract; codeine; doxycycline; eggs or egg-derived products; latex; nsaids; strawberry flavor; and sulfamethoxazole-trimethoprim.  ROS  Objective: BP 120/70   Pulse 72   Ht _0  (1.549 m)   Wt 162 lb (73.5 kg)   BMI 30.61 kg/m   Physical examination Constitutional NAD, Conversant     Extremities: Moves all appropriately.  Normal ROM for age. No lymphadenopathy.  Neuro: Grossly intact  Psych: Oriented to PPT.  Normal mood. Normal affect.   Assessment:  No evidence of pelvic mass  Plan: RTO for annual and prn.  Dalia Heading, CNM

## 2019-01-26 ENCOUNTER — Other Ambulatory Visit: Payer: Medicare Other

## 2019-01-26 ENCOUNTER — Ambulatory Visit: Payer: Medicare Other | Admitting: Certified Nurse Midwife

## 2019-06-07 ENCOUNTER — Other Ambulatory Visit: Payer: Self-pay | Admitting: Certified Nurse Midwife

## 2019-07-03 ENCOUNTER — Institutional Professional Consult (permissible substitution): Payer: Medicare Other | Admitting: Plastic Surgery

## 2019-07-11 ENCOUNTER — Institutional Professional Consult (permissible substitution): Payer: Medicare Other | Admitting: Plastic Surgery

## 2019-08-02 ENCOUNTER — Other Ambulatory Visit: Payer: Self-pay | Admitting: Family

## 2019-08-02 DIAGNOSIS — M25561 Pain in right knee: Secondary | ICD-10-CM

## 2019-08-03 DIAGNOSIS — R251 Tremor, unspecified: Secondary | ICD-10-CM | POA: Insufficient documentation

## 2019-08-03 DIAGNOSIS — R52 Pain, unspecified: Secondary | ICD-10-CM | POA: Insufficient documentation

## 2019-08-04 ENCOUNTER — Other Ambulatory Visit: Payer: Self-pay

## 2019-08-04 ENCOUNTER — Ambulatory Visit
Admission: RE | Admit: 2019-08-04 | Discharge: 2019-08-04 | Disposition: A | Discharge status: Home / Self Care | Payer: Medicare Other | Source: Ambulatory Visit | Attending: Family | Admitting: Family

## 2019-08-04 DIAGNOSIS — M25561 Pain in right knee: Secondary | ICD-10-CM | POA: Insufficient documentation

## 2019-08-04 DIAGNOSIS — M25562 Pain in left knee: Secondary | ICD-10-CM | POA: Diagnosis present

## 2019-08-06 ENCOUNTER — Other Ambulatory Visit: Payer: Self-pay | Admitting: Acute Care

## 2019-08-06 DIAGNOSIS — R531 Weakness: Secondary | ICD-10-CM

## 2019-08-18 ENCOUNTER — Other Ambulatory Visit: Payer: Medicare Other

## 2019-08-18 ENCOUNTER — Ambulatory Visit: Payer: Medicare Other

## 2019-08-22 ENCOUNTER — Ambulatory Visit
Admission: RE | Admit: 2019-08-22 | Discharge: 2019-08-22 | Disposition: A | Discharge status: Home / Self Care | Payer: Medicare Other | Source: Ambulatory Visit | Attending: Acute Care | Admitting: Acute Care

## 2019-08-22 ENCOUNTER — Other Ambulatory Visit: Payer: Self-pay

## 2019-08-22 DIAGNOSIS — R531 Weakness: Secondary | ICD-10-CM | POA: Diagnosis not present

## 2019-08-27 DIAGNOSIS — R200 Anesthesia of skin: Secondary | ICD-10-CM | POA: Insufficient documentation

## 2019-08-27 DIAGNOSIS — M542 Cervicalgia: Secondary | ICD-10-CM | POA: Insufficient documentation

## 2019-08-31 ENCOUNTER — Ambulatory Visit (INDEPENDENT_AMBULATORY_CARE_PROVIDER_SITE_OTHER): Payer: Medicare Other | Admitting: Plastic Surgery

## 2019-08-31 ENCOUNTER — Encounter: Payer: Self-pay | Admitting: Plastic Surgery

## 2019-08-31 ENCOUNTER — Other Ambulatory Visit: Payer: Self-pay

## 2019-08-31 VITALS — BP 120/68 | HR 74 | Temp 98.7°F | Ht 61.0 in | Wt 158.0 lb

## 2019-08-31 DIAGNOSIS — M62838 Other muscle spasm: Secondary | ICD-10-CM | POA: Diagnosis not present

## 2019-08-31 DIAGNOSIS — N644 Mastodynia: Secondary | ICD-10-CM

## 2019-08-31 DIAGNOSIS — N62 Hypertrophy of breast: Secondary | ICD-10-CM

## 2019-08-31 DIAGNOSIS — M5441 Lumbago with sciatica, right side: Secondary | ICD-10-CM

## 2019-08-31 DIAGNOSIS — M25512 Pain in left shoulder: Secondary | ICD-10-CM | POA: Diagnosis not present

## 2019-08-31 DIAGNOSIS — M5442 Lumbago with sciatica, left side: Secondary | ICD-10-CM | POA: Diagnosis not present

## 2019-08-31 DIAGNOSIS — M542 Cervicalgia: Secondary | ICD-10-CM

## 2019-08-31 NOTE — Progress Notes (Addendum)
Patient ID: Carmen Grimes, female    DOB: 1960/08/23, 59 y.o.   MRN: [id number]   Chief Complaint  Patient presents with  . Consult  . Breast Problem    Mammary Hyperplasia: The patient is a 59 y.o. female with a history of mammary hyperplasia for several years.  She has extremely large breasts causing symptoms that include the following: Back pain in the upper and lower back, including neck pain. She pulls or pins her bra straps to provide better lift and relief of the pressure and pain. She notices relief by holding her breast up manually.  Her shoulder straps cause grooves and pain and pressure that requires padding for relief.  She has not had any rashes under her breasts.  Pain medication is sometimes required with motrin and tylenol.  Activities that are hindered by enlarged breasts include: exercise and running.  She has tried supportive clothing as well as fitted bras without improvement.  Her breasts are extremely large and fairly symmetric with the left side slightly larger than the right.  She has hyperpigmentation of the inframammary area on both sides.  The sternal to nipple distance on the right is 26 cm and the left is 28 cm.  The IMF distance is 13 cm.  She is 5 feet 1 inches tall and weighs 158 pounds.  Preoperative bra size = DD cup.  She would like to be a A/B cup. The estimated excess breast tissue to be removed at the time of surgery = 400 grams on the left and 400 grams on the right.  Mammogram history: 08/2018 and was negative.  Family history of breast cancer: Sister with breast cancer.  Tobacco use: Quit smoking years ago.  She does not have diabetes she has had a hysterectomy.  She has also had multiple injections in her neck and back for pain this was done in [place].  She had a local doctor, Dr. Magdalene Molly, from [place] who is also seeing her for neck and back pain.  She has not had physical therapy recently and is willing to undergo physical therapy and update  her mammogram.   Review of Systems  Constitutional: Positive for activity change. Negative for appetite change.  HENT: Negative.   Eyes: Negative.   Respiratory: Negative for chest tightness and shortness of breath.   Cardiovascular: Negative for leg swelling.  Gastrointestinal: Negative for abdominal distention.  Endocrine: Negative.   Genitourinary: Negative.   Musculoskeletal: Positive for back pain and neck pain.  Skin: Negative.   Hematological: Negative.   Psychiatric/Behavioral: Negative.     Past Medical History:  Diagnosis Date  . Asthma    controlled  . Dyspnea 12/2017   difficulty getting her breath at all times  . GERD (gastroesophageal reflux disease)   . H/O: GI bleed   . Hiatal hernia   . Hypertension   . Lung mass   . Mitral valve prolapse   . Osteoarthritis    knees  . Stomach ulcer     Past Surgical History:  Procedure Laterality Date  . ABDOMINAL HYSTERECTOMY  2002  . ANTERIOR FUSION CERVICAL SPINE  2009  . BREAST CYST ASPIRATION Right   . COLONOSCOPY  2013   normal  . ELECTROMAGNETIC NAVIGATION BROCHOSCOPY Left 12/12/2017   Procedure: ELECTROMAGNETIC NAVIGATION BRONCHOSCOPY;  Surgeon: Flora Lipps, MD;  Location: [facility];  Service: Cardiopulmonary;  Laterality: Left;  . ESOPHAGOGASTRODUODENOSCOPY  1990   GI bleed  . FLEXIBLE BRONCHOSCOPY Left 12/12/2017  Procedure: FLEXIBLE BRONCHOSCOPY;  Surgeon: Flora Lipps, MD;  Location: [facility];  Service: Cardiopulmonary;  Laterality: Left;  . KNEE ARTHROSCOPY Right 2010   x3  . KNEE SURGERY Left    Arthroscopy x1  . LAPAROSCOPIC SUPRACERVICAL HYSTERECTOMY  2002   with BSO for fibroids/ AUB/ pelvic pain  . LAPAROSCOPIC TUBAL LIGATION  1982  . LUNG BIOPSY    . SHOULDER SURGERY Right    rotator cuff repair  . SHOULDER SURGERY Right 2011   second rotator cuff repair  . tubal reanastamosis  1985      Current Outpatient Medications:  .  alum & mag hydroxide-simeth (RULOX) 449-201-00 MG/5ML  suspension, Take 30 mLs by mouth every 4 (four) hours as needed., Disp: , Rfl:  .  budesonide-formoterol (SYMBICORT) 80-4.5 MCG/ACT inhaler, Take 2 puffs by mouth 2 (two) times daily., Disp: , Rfl:  .  estradiol (ESTRACE) 2 MG tablet, TAKE 1 TABLET BY MOUTH EVERY DAY, Disp: 90 tablet, Rfl: 0 .  FLOVENT HFA 110 MCG/ACT inhaler, Inhale 1 puff into the lungs as needed., Disp: , Rfl:  .  fluticasone-salmeterol (ADVAIR HFA) 230-21 MCG/ACT inhaler, Inhale 2 puffs into the lungs 2 (two) times daily., Disp: 1 Inhaler, Rfl: 12 .  oxyCODONE (OXY IR/ROXICODONE) 5 MG immediate release tablet, Take 5 mg by mouth as needed., Disp: , Rfl:  .  VENTOLIN HFA 108 (90 Base) MCG/ACT inhaler, Inhale 2 puffs into the lungs every 6 (six) hours as needed for wheezing or shortness of breath. , Disp: , Rfl: 6   Objective:   Vitals:   08/31/19 1126  BP: 120/68  Pulse: 74  Temp: 98.7 F (37.1 C)  SpO2: 99%    Physical Exam Vitals and nursing note reviewed.  Constitutional:      Appearance: Normal appearance.  HENT:     Head: Normocephalic and atraumatic.  Eyes:     Extraocular Movements: Extraocular movements intact.  Cardiovascular:     Rate and Rhythm: Normal rate.     Pulses: Normal pulses.  Pulmonary:     Effort: Pulmonary effort is normal.  Abdominal:     General: Abdomen is flat. There is no distension.     Tenderness: There is no abdominal tenderness.  Musculoskeletal:        General: Normal range of motion.  Skin:    General: Skin is warm.     Capillary Refill: Capillary refill takes less than 2 seconds.  Neurological:     General: No focal deficit present.     Mental Status: She is alert and oriented to person, place, and time.  Psychiatric:        Mood and Affect: Mood normal.        Behavior: Behavior normal.     Assessment & Plan:  Muscle spasm  Breast pain, left - Plan: Ambulatory referral to Physical Therapy  Acute pain of left shoulder - Plan: Ambulatory referral to Physical  Therapy  Acute bilateral low back pain with bilateral sciatica - Plan: Ambulatory referral to Physical Therapy  Symptomatic mammary hypertrophy - Plan: Ambulatory referral to Physical Therapy, MM Digital Diagnostic Bilat  Neck pain - Plan: Ambulatory referral to Physical Therapy  Recommend bilateral breast reduction.  We discussed that there is a possibility of slow healing due to the size she is now on the size she wants to be.  Recommend physical therapy and an updated mammogram due to her family history of breast cancer.  We would like to get her old  records if possible.  We will set her up for the physical therapy and email her the breast reduction brochure.  Pictures were obtained of the patient and placed in the chart with the patient's or guardian's permission.   [place], DO

## 2019-09-05 ENCOUNTER — Other Ambulatory Visit: Payer: Self-pay | Admitting: Plastic Surgery

## 2019-09-05 ENCOUNTER — Telehealth: Payer: Self-pay | Admitting: Plastic Surgery

## 2019-09-05 DIAGNOSIS — N62 Hypertrophy of breast: Secondary | ICD-10-CM

## 2019-09-05 NOTE — Telephone Encounter (Signed)
Left message for patient to return my call. Patient should proceed with PT as recommended by Dr. Marla Roe. This will allow [place] to note that conservative measures have been satisfied.

## 2019-09-11 ENCOUNTER — Other Ambulatory Visit: Payer: Self-pay | Admitting: Plastic Surgery

## 2019-09-11 ENCOUNTER — Other Ambulatory Visit: Payer: Self-pay | Admitting: Family

## 2019-09-11 DIAGNOSIS — Z1231 Encounter for screening mammogram for malignant neoplasm of breast: Secondary | ICD-10-CM

## 2019-09-11 DIAGNOSIS — N644 Mastodynia: Secondary | ICD-10-CM

## 2019-09-16 DIAGNOSIS — M5030 Other cervical disc degeneration, unspecified cervical region: Secondary | ICD-10-CM | POA: Insufficient documentation

## 2019-09-16 DIAGNOSIS — M5020 Other cervical disc displacement, unspecified cervical region: Secondary | ICD-10-CM | POA: Insufficient documentation

## 2019-09-19 ENCOUNTER — Ambulatory Visit
Admission: RE | Admit: 2019-09-19 | Discharge: 2019-09-19 | Disposition: A | Discharge status: Home / Self Care | Payer: Medicare Other | Source: Ambulatory Visit | Attending: Plastic Surgery | Admitting: Plastic Surgery

## 2019-09-19 ENCOUNTER — Other Ambulatory Visit: Payer: Self-pay

## 2019-09-19 DIAGNOSIS — N644 Mastodynia: Secondary | ICD-10-CM | POA: Insufficient documentation

## 2019-10-19 ENCOUNTER — Other Ambulatory Visit: Payer: Self-pay | Admitting: Internal Medicine

## 2019-10-19 DIAGNOSIS — J4521 Mild intermittent asthma with (acute) exacerbation: Secondary | ICD-10-CM

## 2019-11-09 ENCOUNTER — Ambulatory Visit: Payer: Medicare Other

## 2019-11-23 ENCOUNTER — Other Ambulatory Visit: Payer: Self-pay

## 2019-11-23 ENCOUNTER — Encounter: Payer: Self-pay | Admitting: Plastic Surgery

## 2019-11-23 ENCOUNTER — Ambulatory Visit (INDEPENDENT_AMBULATORY_CARE_PROVIDER_SITE_OTHER): Payer: Medicare Other | Admitting: Plastic Surgery

## 2019-11-23 VITALS — BP 116/68 | HR 70 | Temp 98.5°F

## 2019-11-23 DIAGNOSIS — G8929 Other chronic pain: Secondary | ICD-10-CM | POA: Diagnosis not present

## 2019-11-23 DIAGNOSIS — M546 Pain in thoracic spine: Secondary | ICD-10-CM | POA: Diagnosis not present

## 2019-11-23 DIAGNOSIS — M25551 Pain in right hip: Secondary | ICD-10-CM

## 2019-11-23 NOTE — Progress Notes (Signed)
° °  Subjective:    Patient ID: Carmen Grimes, female    DOB: [birth year], 59 y.o.   MRN: [id number]  The patient is a 59 year old female here for evaluation of her abdomen.  She complains of back pain and was referred by the orthopedic surgeon.  She has loss of core strength.  She is being evaluated for breast reduction and has started physical therapy for that.  She does not have a hernia.  She has a very mild diastases of 1-2 centimeters.  She is also complaining of some GI upset.  I have recommended she talk to her PCP about this and possibly have a GI consult.  She is interested in a panniculectomy.  Her orthopedic surgeon is coming discerned that she has nerve compression that might be exacerbated by her pannus.     Review of Systems  Constitutional: Positive for activity change. Negative for appetite change.  Eyes: Negative.   Respiratory: Negative.   Cardiovascular: Negative.   Gastrointestinal: Negative.  Negative for abdominal pain.  Endocrine: Negative.   Genitourinary: Negative.   Musculoskeletal: Positive for back pain.  Hematological: Negative.   Psychiatric/Behavioral: Negative.        Objective:   Physical Exam Vitals and nursing note reviewed.  Constitutional:      Appearance: Normal appearance.  HENT:     Head: Normocephalic and atraumatic.  Cardiovascular:     Rate and Rhythm: Normal rate.     Pulses: Normal pulses.  Pulmonary:     Effort: Pulmonary effort is normal. No respiratory distress.  Abdominal:     General: Abdomen is flat. There is no distension.     Tenderness: There is no abdominal tenderness.     Hernia: No hernia is present.  Skin:    General: Skin is warm.     Capillary Refill: Capillary refill takes less than 2 seconds.  Neurological:     General: No focal deficit present.     Mental Status: She is alert and oriented to person, place, and time.  Psychiatric:        Mood and Affect: Mood normal.        Assessment & Plan:     ICD-10-CM    1. Right hip pain  M25.551   2. Chronic bilateral thoracic back pain  M54.6    G89.29     Pictures were obtained of the patient and placed in the chart with the patient's or guardian's permission.  The patient is a candidate for panniculectomy.  This does not include the upper part of her abdomen.  That would be additional fee.

## 2020-01-31 ENCOUNTER — Telehealth: Payer: Self-pay | Admitting: Physician Assistant

## 2020-01-31 NOTE — Telephone Encounter (Signed)
Patient declined recall as she has moved.  She would like a referral to new cardiologist in [place] .    Deleting recall.

## 2020-05-08 IMAGING — PT NM PET TUM IMG INITIAL (PI) SKULL BASE T - THIGH
1 of 9 series · 1 of 25 positions shown · non-contrast
Comparison: Chest CT 11/11/2017.  Plain film 11/02/2017.

CLINICAL DATA: Initial treatment strategy for lingular opacity.

EXAM:
NUCLEAR MEDICINE PET SKULL BASE TO THIGH
TECHNIQUE: 8.6 mCi F-18 FDG was injected intravenously. Full-ring PET imaging
was performed from the skull base to thigh after the radiotracer. CT
data was obtained and used for attenuation correction and anatomic
localization.
Fasting blood glucose: 69 mg/dl

[Series 3: ct wb 5.0 b30f · axial · 5.0mm · 0.98mm/px · 1 of 290 slices shown]
[im 290/290  brain]
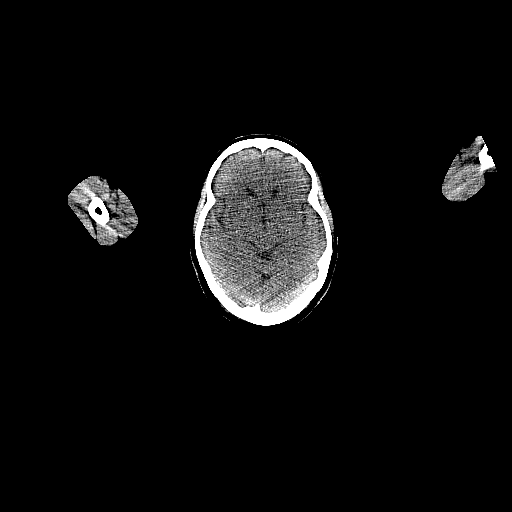

[1 of 25 positions shown; findings below may reference images not displayed]

FINDINGS: Mediastinal blood pool activity: SUV max

NECK: No areas of abnormal hypermetabolism.

Incidental CT findings: No cervical adenopathy.

CHEST: Again identified is a lingular opacity which measures 2.1 x
1.7 cm and a S.U.V. max of 4.9 on image 89/3. Similar in size to
11/11/2017.

Volume loss within the anterior left lower lobe with an area of more
focal soft tissue density which is difficult to differentiate on
both CT and PET from adjacent left ventricle. Felt to measure on the
order of 2.9 x 1.9 cm and a S.U.V. max of 8.1 on image 109/3.
Increased from a subtle area of soft tissue density of 1.5 x 0.8 cm
on 11/11/2017 CT (image 112/4 of that exam). No thoracic nodal
hypermetabolism.

Incidental CT findings: Left main or proximal LAD coronary artery
calcification including on image 87/3.

ABDOMEN/PELVIS: No abdominopelvic parenchymal or nodal
hypermetabolism identified.

Incidental CT findings: Normal adrenal glands. Upper pole left renal
cyst of 2.1 cm. Colonic stool burden suggests constipation.

SKELETON: No abnormal marrow activity.

Incidental CT findings: Probable bone island in the left sacrum.
Cervical spine fixation.
IMPRESSION: 1. Hypermetabolic lingular opacity. This is similar in size to
11/11/2017. Concurrent anterior left lower lobe hypermetabolic
opacity, increased compared to 11/11/2017. This short-term
progression, as well as morphology, may represent progressive
infection. Neoplasia, especially in the lingula, cannot be excluded.
If the patient has ongoing infectious symptoms, consider antibiotic
therapy and CT follow-up at 4-6 weeks. If not, consider sampling of
the lingula.
2. No hypermetabolic thoracic adenopathy or evidence of
extrathoracic disease.
3. Age advanced coronary artery atherosclerosis. Recommend
assessment of coronary risk factors and consideration of medical
therapy.

## 2020-05-28 IMAGING — DX DG CHEST 1V PORT
1 series · 1 of 1 positions shown · non-contrast
Comparison: 12/12/2017 chest radiograph

CLINICAL DATA: 57 y/o  F; shortness of breath.

EXAM:
PORTABLE CHEST 1 VIEW

[chest ap]
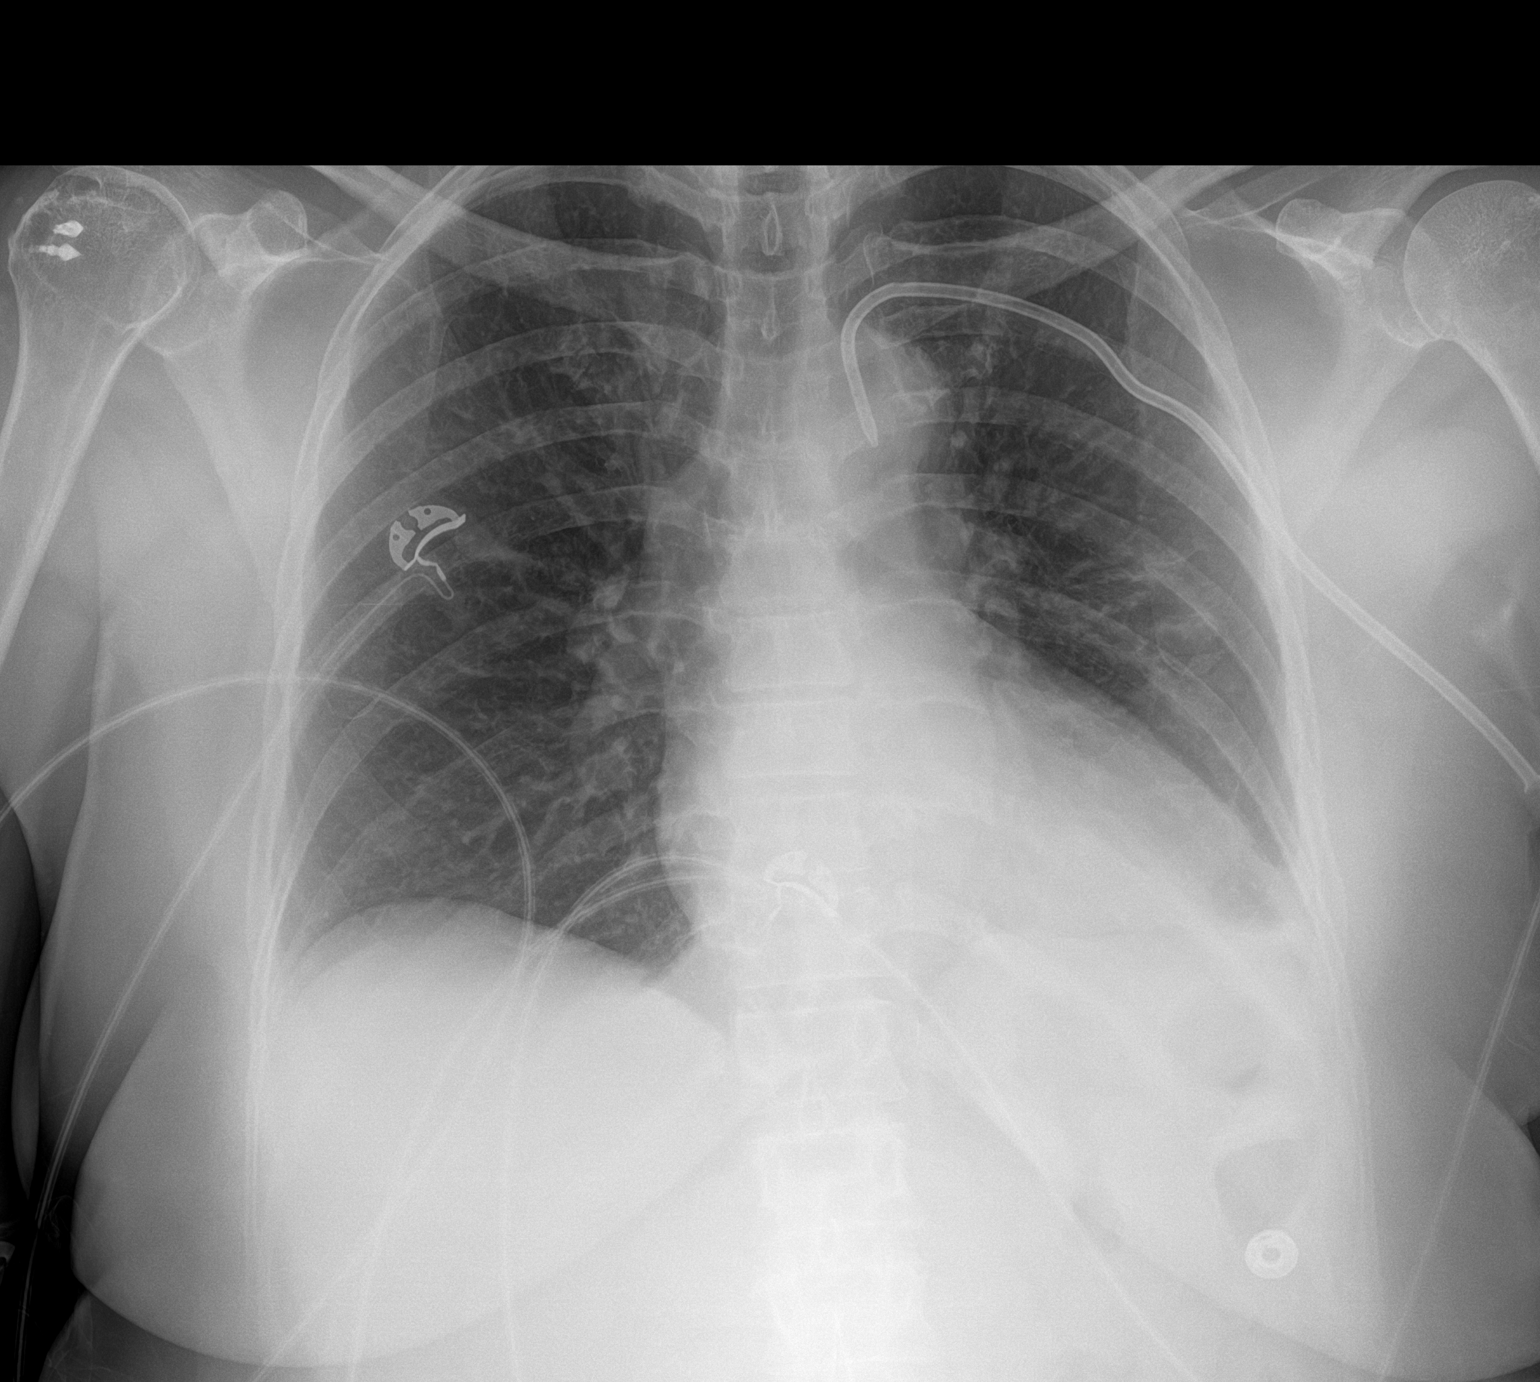

[1 of 1 positions shown; findings below may reference images not displayed]

FINDINGS: Stable normal cardiac silhouette given projection and technique.
Left-sided chest tube. Trace residual subpulmonic pneumothorax.
Small left pleural effusion. No acute osseous abnormality
identified.
IMPRESSION: Trace residual left subpulmonic pneumothorax, chest tube in situ.
Small left pleural effusion.

## 2020-05-29 IMAGING — DX DG CHEST 1V PORT
1 series · 1 of 1 positions shown · non-contrast
Comparison: Single-view of the chest 12/12/2017.

CLINICAL DATA: The patient developed a pneumothorax after biopsy of
a pulmonary nodule 12/12/2017. Left chest tube in place.

EXAM:
PORTABLE CHEST 1 VIEW

[chest ap]
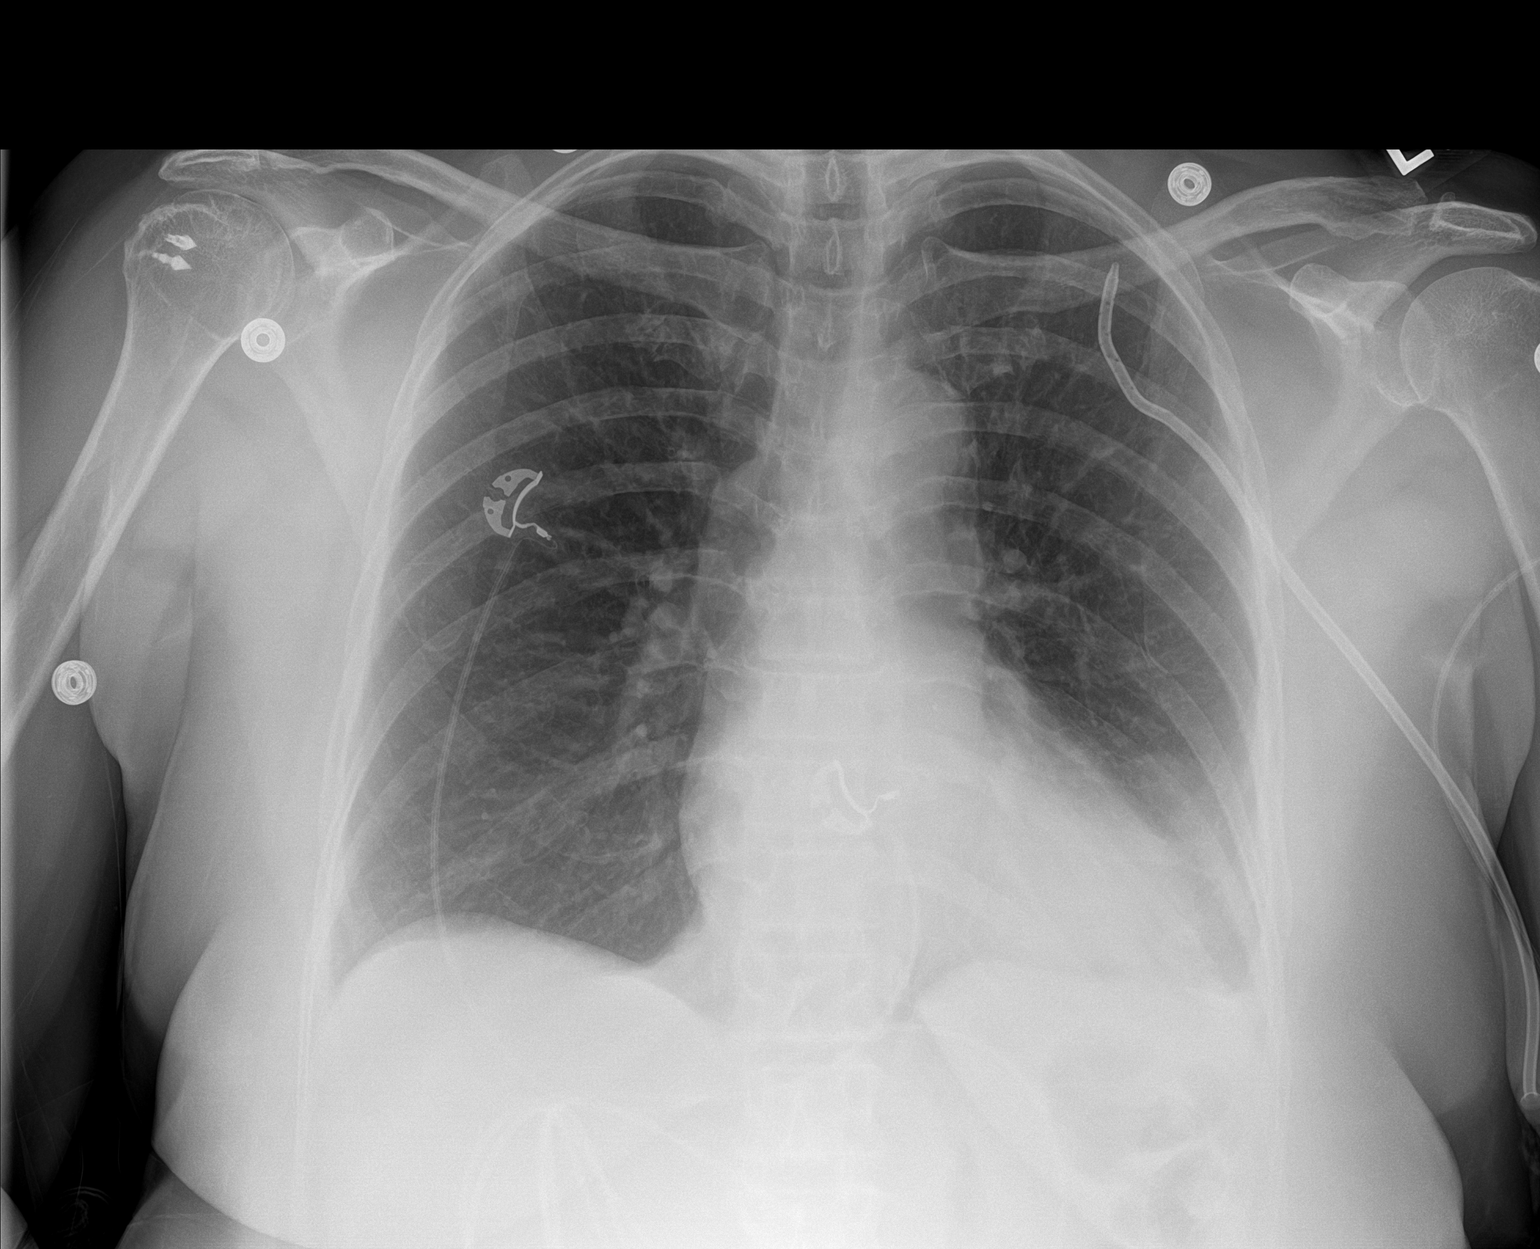

[1 of 1 positions shown; findings below may reference images not displayed]

FINDINGS: Left chest tube has been pulled back with the tip now projecting
over the diaphysis of the left clavicle. Trace left pneumothorax is
unchanged. Small left effusion basilar atelectasis noted. The right
lung is expanded and clear. Heart size is normal. Aortic
atherosclerosis noted.
IMPRESSION: The tip of the patient's left chest tube now projects over the
diaphysis of the left clavicle. Trace left pneumothorax is
unchanged.

Small left effusion and basilar atelectasis.

## 2020-06-24 IMAGING — CR DG CHEST 2V
1 series · 2 of 2 positions shown · non-contrast
Comparison: Radiographs 12/26/2017. Chest CT 12/09/2017

CLINICAL DATA: Palpitations.

EXAM:
CHEST - 2 VIEW

[Series 1: dg chest 2 view · 0.14mm/px · 2 of 2 slices shown]
[im 1/2]
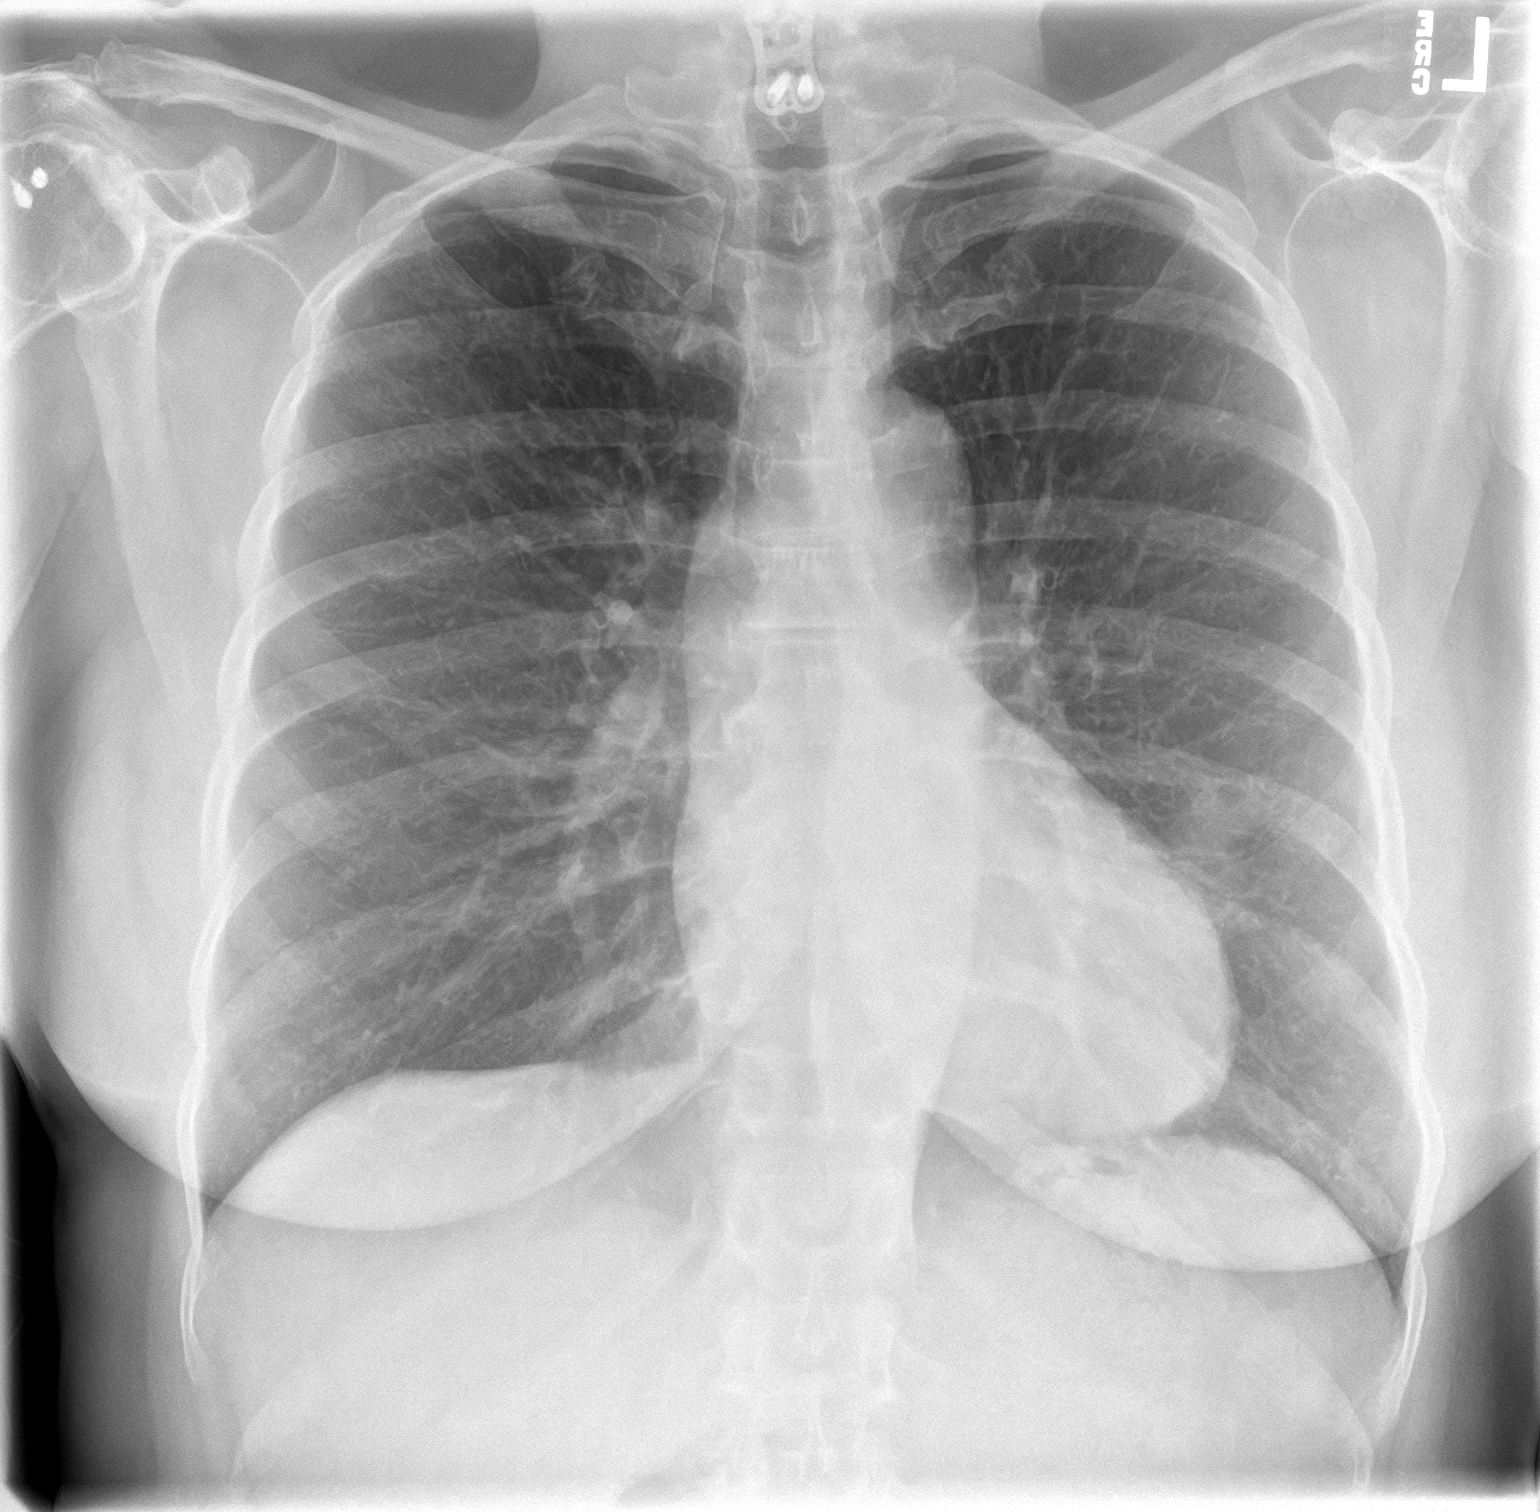
[im 2/2]
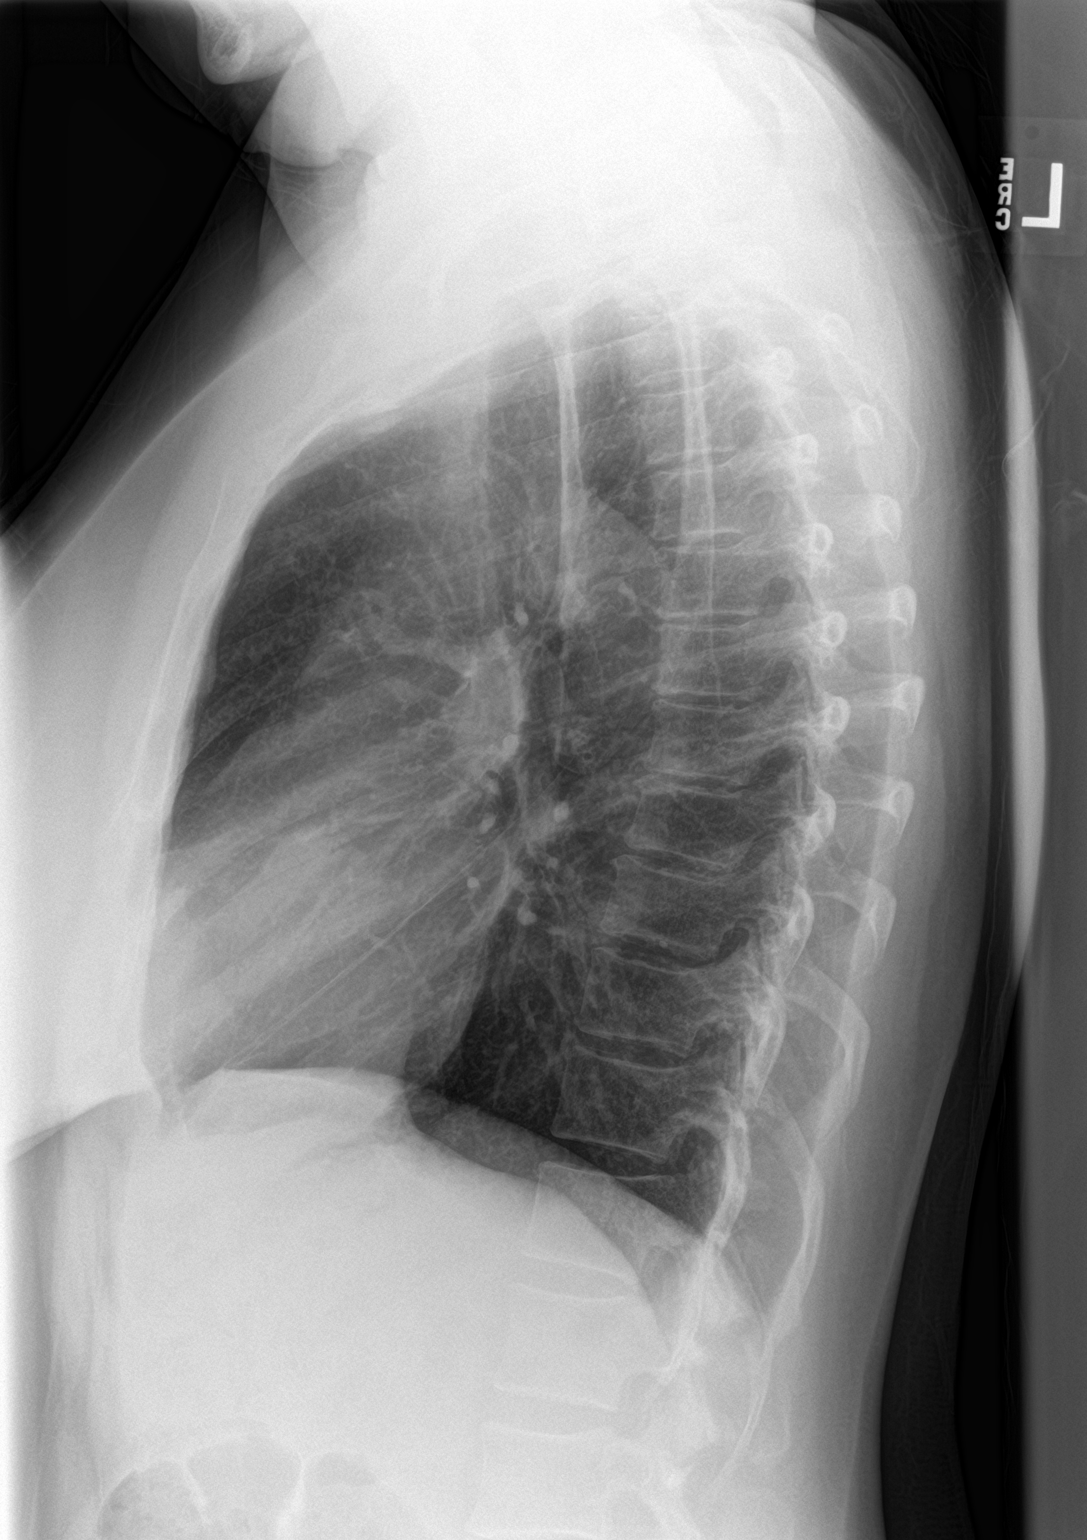

[2 of 2 positions shown; findings below may reference images not displayed]

FINDINGS: The cardiomediastinal contours are normal. Rounded opacity abutting
the left heart border is unchanged from prior exam, corresponding to
nodule on recent CT. Pulmonary vasculature is normal. No
consolidation, pleural effusion, or pneumothorax. No acute osseous
abnormalities are seen. Surgical anchors in the right shoulder.
IMPRESSION: 1. No acute chest findings.
2. Unchanged rounded opacity abutting the left heart border
corresponding to nodule on recent cross-sectional imaging.

## 2021-07-22 ENCOUNTER — Telehealth: Payer: Self-pay | Admitting: Internal Medicine

## 2021-07-22 DIAGNOSIS — J4520 Mild intermittent asthma, uncomplicated: Secondary | ICD-10-CM

## 2021-07-22 NOTE — Telephone Encounter (Signed)
Dr. Mortimer Fries, please advise if okay to refer to Dr. Raelyn Ensign. Patient has relocated.

## 2021-07-24 NOTE — Telephone Encounter (Signed)
Referral has been placed. Patient is aware and voiced her understanding.  Nothing further needed.

## 2022-02-22 ENCOUNTER — Telehealth: Payer: Self-pay

## 2022-02-22 NOTE — Telephone Encounter (Signed)
Pt sent email regarding recently she has not been able to get on a good sleep schedule. Said she thinks it's anxiety but also excitement about starting a new job with [employer] in march. She asked if you can send a week supply of ambien to help her get on schedule? Please advise

## 2022-02-24 MED ORDER — ZOLPIDEM TARTRATE 5 MG PO TABS: 5.0000 mg | ORAL_TABLET | Freq: Every evening | ORAL | 0 refills | Status: DC | PRN

## 2022-02-24 NOTE — Addendum Note (Signed)
Addended by: Georgian Co on: 02/24/2022 11:26 AM   Modules accepted: Orders

## 2022-03-02 ENCOUNTER — Other Ambulatory Visit: Payer: Self-pay

## 2022-03-02 DIAGNOSIS — M5412 Radiculopathy, cervical region: Secondary | ICD-10-CM

## 2022-03-06 LAB — CMP14+EGFR
ALT: 15 IU/L (ref 0–32)
AST: 21 IU/L (ref 0–40)
Albumin/Globulin Ratio: 1.8 (ref 1.2–2.2)
Albumin: 4.9 g/dL (ref 3.9–4.9)
Alkaline Phosphatase: 93 IU/L (ref 44–121)
BUN/Creatinine Ratio: 12 (ref 12–28)
BUN: 11 mg/dL (ref 8–27)
Bilirubin Total: 0.3 mg/dL (ref 0.0–1.2)
CO2: 23 mmol/L (ref 20–29)
Calcium: 10.6 mg/dL — ABNORMAL HIGH (ref 8.7–10.3)
Chloride: 103 mmol/L (ref 96–106)
Creatinine, Ser: 0.91 mg/dL (ref 0.57–1.00)
Globulin, Total: 2.8 g/dL (ref 1.5–4.5)
Glucose: 96 mg/dL (ref 70–99)
Potassium: 4.8 mmol/L (ref 3.5–5.2)
Sodium: 143 mmol/L (ref 134–144)
Total Protein: 7.7 g/dL (ref 6.0–8.5)
eGFR: 70 mL/min/{1.73_m2} (ref >59)

## 2022-03-12 ENCOUNTER — Telehealth: Payer: Self-pay

## 2022-03-12 NOTE — Telephone Encounter (Signed)
Pt called requesting refill on pain rx, it's the oxycodone '10mg'$  bid. I tried to search it in our system but it wouldn't find just the regular oxycodone '10mg'$ , please advise

## 2022-03-17 MED ORDER — OXYCODONE HCL 10 MG PO TABS: 10.0000 mg | ORAL_TABLET | Freq: Two times a day (BID) | ORAL | 0 refills | Status: DC | PRN

## 2022-03-17 NOTE — Addendum Note (Signed)
Addended by: Georgian Co on: 03/17/2022 02:50 PM   Modules accepted: Orders

## 2022-03-19 ENCOUNTER — Telehealth: Payer: Self-pay

## 2022-03-19 NOTE — Telephone Encounter (Signed)
Pt called regarding if we have received MRI results yet? Please advise

## 2022-03-24 ENCOUNTER — Telehealth: Payer: Self-pay

## 2022-03-24 NOTE — Telephone Encounter (Signed)
Pt sent email regarding if you have gotten MRI results yet, she said from email: the mass is still on her right side and at times feels discomfort in that area & everyday her stomach becomes bloated, no matter her intake liquid or solid nothing feels good in her stomach. Affects her breathing. Had an endoscopy & coloscopy last April 2023 & results were normal but said she doesn't feel normal. Please advise

## 2022-04-07 ENCOUNTER — Encounter: Payer: Self-pay | Admitting: Family

## 2022-04-15 ENCOUNTER — Other Ambulatory Visit: Payer: Self-pay

## 2022-04-18 MED ORDER — OXYCODONE HCL 10 MG PO TABS: 10.0000 mg | ORAL_TABLET | Freq: Two times a day (BID) | ORAL | 0 refills | Status: DC | PRN

## 2022-05-07 ENCOUNTER — Telehealth: Payer: Self-pay

## 2022-05-07 NOTE — Telephone Encounter (Signed)
Pt sent email regarding pain referral, said she contacted [facility] & they gave her this fax number: [phone]. [place] they just need a referral & her medical notes to be submitted to them? Please advise

## 2022-05-18 ENCOUNTER — Ambulatory Visit (INDEPENDENT_AMBULATORY_CARE_PROVIDER_SITE_OTHER): Payer: 59 | Admitting: Family

## 2022-05-18 VITALS — BP 122/74 | HR 50 | Ht 61.0 in | Wt 155.0 lb

## 2022-05-18 DIAGNOSIS — G4452 New daily persistent headache (NDPH): Secondary | ICD-10-CM

## 2022-05-18 DIAGNOSIS — M5136 Other intervertebral disc degeneration, lumbar region: Secondary | ICD-10-CM

## 2022-05-18 DIAGNOSIS — I479 Paroxysmal tachycardia, unspecified: Secondary | ICD-10-CM | POA: Diagnosis not present

## 2022-05-18 DIAGNOSIS — M47812 Spondylosis without myelopathy or radiculopathy, cervical region: Secondary | ICD-10-CM

## 2022-05-18 DIAGNOSIS — M1711 Unilateral primary osteoarthritis, right knee: Secondary | ICD-10-CM

## 2022-05-18 DIAGNOSIS — M62838 Other muscle spasm: Secondary | ICD-10-CM | POA: Diagnosis not present

## 2022-05-18 DIAGNOSIS — E559 Vitamin D deficiency, unspecified: Secondary | ICD-10-CM

## 2022-05-18 DIAGNOSIS — M542 Cervicalgia: Secondary | ICD-10-CM | POA: Diagnosis not present

## 2022-05-18 DIAGNOSIS — I471 Supraventricular tachycardia, unspecified: Secondary | ICD-10-CM | POA: Insufficient documentation

## 2022-05-18 NOTE — Progress Notes (Unsigned)
Established Patient Office Visit  Subjective:  Patient ID: Carmen Grimes, female    DOB: 20-Mar-1960  Age: 62 y.o. MRN: [id number]  Chief Complaint  Patient presents with   Follow-up    Knot on trachea, joint issues    Hand locking up at night - when she wakes up in the morning, it's like "rigor mortis". Dr. Ernest Pine says that her hand pain is coming from tendonitis and trigger finger, and that her hip pain is coming from bursitis.   She does still have significant pain in her neck/back as well.   Has also been having headaches that come at random.  They come and go, often sharp stabbing when they happen.  She says that it will happen when she closes her right eye and that it primarily hurts on her right temple. She says it is a stabbing pain, and that she is having a lot of trouble with it.  She notices it more when she closes the right eye, but it does seem to come from behind her ear and up her neck as well.     No other concerns at this time.   Past Medical History:  Diagnosis Date   Anxiety state 10/18/2017   Asthma    controlled   Dyspnea 12/2017   difficulty getting her breath at all times   Esophageal reflux 06/26/2017   Formatting of this note might be different from the original.  06/23/18: UGI series - Mild GERD.   Former cigarette smoker 03/09/2018   GERD (gastroesophageal reflux disease)    H. pylori infection 06/19/2018   Formatting of this note might be different from the original.  06/18/08 - H pylori breath test +. S/p amoxicillin + clarithromycin + omeprazole BID x 10 days. Omeprazole BID continued x 2 months for empiric treatment of possible gastritis.   H/O chest tube placement    H/O: GI bleed    Hiatal hernia    History of bronchoscopy 03/30/2018   History of thoracic surgery 03/30/2018   Hypertension    Lung mass    Mitral valve prolapse    Osteoarthritis    knees   Other nonspecific abnormal finding of lung field 11/18/2017   Other pneumothorax  12/12/2017   Stomach ulcer     Past Surgical History:  Procedure Laterality Date   ABDOMINAL HYSTERECTOMY  2002   ANTERIOR FUSION CERVICAL SPINE  2009   BREAST CYST ASPIRATION Right    COLONOSCOPY  2013   normal   ELECTROMAGNETIC NAVIGATION BROCHOSCOPY Left 12/12/2017   Procedure: ELECTROMAGNETIC NAVIGATION BRONCHOSCOPY;  Surgeon: Erin Fulling, MD;  Location: [facility];  Service: Cardiopulmonary;  Laterality: Left;   ESOPHAGOGASTRODUODENOSCOPY  1990   GI bleed   FLEXIBLE BRONCHOSCOPY Left 12/12/2017   Procedure: FLEXIBLE BRONCHOSCOPY;  Surgeon: Erin Fulling, MD;  Location: [facility];  Service: Cardiopulmonary;  Laterality: Left;   KNEE ARTHROSCOPY Right 2010   x3   KNEE SURGERY Left    Arthroscopy x1   LAPAROSCOPIC SUPRACERVICAL HYSTERECTOMY  2002   with BSO for fibroids/ AUB/ pelvic pain   LAPAROSCOPIC TUBAL LIGATION  1982   LUNG BIOPSY     SHOULDER SURGERY Right    rotator cuff repair   SHOULDER SURGERY Right 2011   second rotator cuff repair   tubal reanastamosis  1985    Social History   Socioeconomic History   Marital status: Divorced    Spouse name: Not on file   Number of children: 3   Years of  education: Not on file   Highest education level: Not on file  Occupational History   Occupation: CMA and phlebotomist    Comment: not working but would like to  Tobacco Use   Smoking status: Former    Packs/day: 1.00    Years: 13.00    Additional pack years: 0.00    Total pack years: 13.00    Types: Cigarettes   Smokeless tobacco: Never  Vaping Use   Vaping Use: Never used  Substance and Sexual Activity   Alcohol use: Not Currently   Drug use: No   Sexual activity: Not Currently    Birth control/protection: Post-menopausal  Other Topics Concern   Not on file  Social History Narrative   Independent at baseline.  Lives at home by herself   Social Determinants of Health   Financial Resource Strain: Not on file  Food Insecurity: Not on file  Transportation  Needs: Not on file  Physical Activity: Sufficiently Active (01/24/2017)   Exercise Vital Sign    Days of Exercise per Week: 5 days    Minutes of Exercise per Session: 50 min  Stress: No Stress Concern Present (01/24/2017)   [employer] of Occupational Health - Occupational Stress Questionnaire    Feeling of Stress : Not at all  Social Connections: Somewhat Isolated (01/24/2017)   Social Connection and Isolation Panel [NHANES]    Frequency of Communication with Friends and Family: More than three times a week    Frequency of Social Gatherings with Friends and Family: More than three times a week    Attends Religious Services: More than 4 times per year    Active Member of [employer] or Organizations: No    Attends Banker Meetings: Never    Marital Status: Divorced  Catering manager Violence: Not At Risk (01/24/2017)   Humiliation, Afraid, Rape, and Kick questionnaire    Fear of Current or Ex-Partner: No    Emotionally Abused: No    Physically Abused: No    Sexually Abused: No    Family History  Problem Relation Age of Onset   Diabetes Mother    Dementia Mother        Alzheimers   Hypertension Mother    Glaucoma Mother    Prostate cancer Father 33   Stroke Father    Arthritis Father    Osteoporosis Father    Prostate cancer Brother    Breast cancer Sister 20   Uterine cancer Paternal Aunt        1 of 4 paternal aunts   Ovarian cancer Maternal Aunt        1 of 5 maternal aunts    Allergies  Allergen Reactions   Ascorbic Acid Hives and Nausea And Vomiting    HIVES / VOMITING   Celecoxib Other (See Comments), Nausea And Vomiting and Shortness Of Breath    GI bleed  Other reaction(s): gi distress, Other (comments), Other (See Comments)  GI BLEED  GI bleeding  GI Bleeding  Other reaction(s): gi distress, Other (See Comments)  GI bleed  Other reaction(s): gi distress, Other (comments), Other (See Comments)  GI BLEED            GI bleeding    GI  BLEED    GI Bleeding      GI bleed  Other reaction(s): gi distress, Other (comments), Other (See Comments)  GI BLEED  GI BLEED  GI Bleeding  GI Bleeding    GI BLEED    GI bleed  Other reaction(s): gi distress, Other (comments), Other (See Comments) GI BLEED      GI bleeding  GI BLEED  GI Bleeding   GI bleed Other reaction(s): gi distress, Other (comments), Other (See Comments) GI BLEED    Other reaction(s): gi distress, Other (See Comments) GI bleed Other reaction(s): gi distress, Other (comments), Other (See Comments) GI BLEED      GI bleeding  GI BLEED  GI Bleeding   GI bleed Other reaction(s): gi distress, Other (comments), Other (See Comments) GI BLEED GI BLEED GI Bleeding   Doxycycline Other (See Comments), Nausea And Vomiting and Shortness Of Breath    Unknown reaction.  Other reaction(s): Other (See Comments), Unknown, Unknown (comments)  Pt does not remember  Other reaction(s): Other (See Comments), Other (See Comments), unknown, Unknown (comments)  Unknown reaction.  Other reaction(s): Other (See Comments), Unknown, Unknown (comments)  Pt does not remember  Pt does not remember  Pt does not remember    Unknown reaction. Other reaction(s): Other (See Comments), Unknown, Unknown (comments) Pt does not remember    Other reaction(s): Other (See Comments), Other (See Comments), unknown, Unknown (comments) Unknown reaction. Other reaction(s): Other (See Comments), Unknown, Unknown (comments) Pt does not remember Pt does not remember   Gabapentin Other (See Comments), Nausea And Vomiting and Shortness Of Breath    Drowsiness.  Like a zombie, not functional  Other reaction(s): Other (See Comments), Other (See Comments), Unknown, Unknown (comments)  Drowsiness.  Like a zombie, not functional  Drowsiness, lethargy  Drowsiness, lethargy    Drowsiness. Like a zombie, not functional    Other reaction(s): Other (See Comments), Other (See Comments), Unknown, Unknown  (comments) Drowsiness. Like a zombie, not functional Drowsiness, lethargy   Shellfish Allergy Anaphylaxis    Other reaction(s): anaphylaxis/angioedema  Betadine on skin is okay  Other reaction(s): anaphylaxis/angioedema Other reaction(s): anaphylaxis/angioedema  Betadine on skin is okay   Strawberry Extract Hives, Nausea And Vomiting, Nausea Only and Rash    Severe vomitting  Other Reaction(s): Skin Rashes, Hives   Tape Dermatitis    Patient stated Tegaderm after surgery as well as a clear tape tear off her top skin layer and leave her raw. Was told to notify team allergy to tape.    Tramadol Other (See Comments)    "blacked out", found wandering in the street  Other Reaction(s): Not available, Other  Other Reaction(s): neurological reaction    confusion    Altered mental status, confusion    CONFUSION    "blacked out", found wandering in the street  Other Reaction(s): neurological reaction, Not available, Other (See Comments)    "blacked out", found wandering in the street    Altered mental status, confusion    CONFUSION    confusion    Other Reaction(s): neurological reaction  confusion  Altered mental status, confusion  CONFUSION  "blacked out", found wandering in the street   Ibuprofen Other (See Comments)    "Blood disorder", GI bleed.  Other reaction(s): gi distress, Other (comments), Other (See Comments)  Other Reaction(s): GI bleeding, Not available, Other  GI Bleeding    GI BLEED    "Blood disorder", GI bleed. Other reaction(s): gi distress, Other (comments), Other (See Comments)  Other Reaction(s): Not available, Other (See Comments)    "Blood disorder", GI bleed. Other reaction(s): gi distress, Other (comments), Other (See Comments)    GI BLEED    GI Bleeding    GI Bleeding  GI BLEED  "Blood disorder", GI bleed. Other reaction(s):  gi distress, Other (comments), Other (See Comments)   Montelukast Other (See Comments)    Syncope..passed  out  Other Reaction(s): Other  Caused Fainting    Passed out    Syncope..passed out  Other Reaction(s): Not available, other/intolerance, Syncope    Syncope..passed out    Passed out    Caused Fainting    Caused Fainting  Passed out  Syncope..passed out   Naproxen Other (See Comments)    GI bleed  Other reaction(s): Other (comments), Other (See Comments)  Other Reaction(s): GI Bleed, GI bleeding, Not available, Other  GI Bleeding    GI BLEED    GI bleed Other reaction(s): Other (comments), Other (See Comments)  Other Reaction(s): Other (See Comments)    GI bleed Other reaction(s): Other (comments), Other (See Comments)    GI BLEED    GI Bleeding    GI Bleeding  GI BLEED  GI bleed Other reaction(s): Other (comments), Other (See Comments)   Prednisone Other (See Comments)    Tremors, severe  Other Reaction(s): Other (See Comments)  Other reaction(s): Other (See Comments), Other (See Comments)  Tremors, severe  Tremors and mental fogginess with oral prednisone, tolerates topical and inhaled  Tremors and mental fogginess with oral prednisone, tolerates topical and inhaled    Tremors, severe    Other reaction(s): Other (See Comments), Other (See Comments) Tremors, severe Tremors and mental fogginess with oral prednisone, tolerates topical and inhaled   Ascorbate Hives    HIVES / VOMITING   Codeine Itching and Nausea Only    Other reaction(s): mild rash/itching   Egg-Derived Products Other (See Comments) and Rash    rash  Other reaction(s): Other (See Comments)  rash  rash    Other reaction(s): Other (See Comments) rash   Latex Itching and Rash    Other reaction(s): mild rash/itching  Other Reaction(s): Not available, Skin Rashes, Hives  Other reaction(s): rash/itching   Nsaids Nausea And Vomiting and Other (See Comments)    Gastric bleed  Other Reaction(s): GI Bleed, Other  Other reaction(s): Other (See Comments), other/intolerance  Gastric  bleed  Gastric bleed  Gastric bleed   Strawberry Flavor Hives   Sulfamethoxazole-Trimethoprim Other (See Comments)    Other reaction(s): Other (See Comments), other/intolerance  MAKES PAIN WORSE  Increases pain  Other Reaction(s): Other (See Comments)  Pt does not remember  Increases pain    Pt does not remember    MAKES PAIN WORSE    Other reaction(s): Other (See Comments), other/intolerance MAKES PAIN WORSE  Increases pain Increases pain    Review of Systems  Musculoskeletal:  Positive for back pain, joint pain and neck pain.  Neurological:  Positive for dizziness and headaches.  All other systems reviewed and are negative.      Objective:   BP 122/74   Pulse (!) 50   Ht 5\' 1"  (1.549 m)   Wt 155 lb (70.3 kg)   SpO2 99%   BMI 29.29 kg/m   Vitals:   05/18/22 1258  BP: 122/74  Pulse: (!) 50  Height: 5\' 1"  (1.549 m)  Weight: 155 lb (70.3 kg)  SpO2: 99%  BMI (Calculated): 29.3    Physical Exam Vitals and nursing note reviewed.  Constitutional:      Appearance: Normal appearance. She is normal weight.  HENT:     Head: Normocephalic.  Eyes:     Extraocular Movements: Extraocular movements intact.     Conjunctiva/sclera: Conjunctivae normal.     Pupils: Pupils are equal, round,  and reactive to light.  Cardiovascular:     Rate and Rhythm: Normal rate.  Pulmonary:     Effort: Pulmonary effort is normal.  Musculoskeletal:     Right hand: Tenderness and bony tenderness present. Decreased range of motion. Decreased strength.     Right hip: Tenderness present. Decreased range of motion.     Right knee: Decreased range of motion. Tenderness present.  Neurological:     General: No focal deficit present.     Mental Status: She is alert and oriented to person, place, and time. Mental status is at baseline.  Psychiatric:        Mood and Affect: Mood normal.        Behavior: Behavior normal.        Thought Content: Thought content normal.        Judgment:  Judgment normal.      No results found for any visits on 05/18/22.  Recent Results (from the past 2160 hour(s))  CMP14+EGFR     Status: Abnormal   Collection Time: 03/05/22  9:31 AM  Result Value Ref Range   Glucose 96 70 - 99 mg/dL   BUN 11 8 - 27 mg/dL   Creatinine, Ser 4.09 0.57 - 1.00 mg/dL   eGFR 70 >81 XB/JYN/8.29   BUN/Creatinine Ratio 12 12 - 28   Sodium 143 134 - 144 mmol/L   Potassium 4.8 3.5 - 5.2 mmol/L   Chloride 103 96 - 106 mmol/L   CO2 23 20 - 29 mmol/L   Calcium 10.6 (H) 8.7 - 10.3 mg/dL   Total Protein 7.7 6.0 - 8.5 g/dL   Albumin 4.9 3.9 - 4.9 g/dL   Globulin, Total 2.8 1.5 - 4.5 g/dL   Albumin/Globulin Ratio 1.8 1.2 - 2.2   Bilirubin Total 0.3 0.0 - 1.2 mg/dL   Alkaline Phosphatase 93 44 - 121 IU/L   AST 21 0 - 40 IU/L   ALT 15 0 - 32 IU/L       Assessment & Plan:   Problem List Items Addressed This Visit       Active Problems   Cervical spondylosis without myelopathy   Relevant Medications   methotrexate (RHEUMATREX) 2.5 MG tablet   Other Relevant Orders   MR Cervical Spine Wo Contrast   Other intervertebral disc degeneration, lumbar region   Relevant Medications   methotrexate (RHEUMATREX) 2.5 MG tablet   Muscle spasm   Primary osteoarthritis of right knee   Relevant Medications   methotrexate (RHEUMATREX) 2.5 MG tablet   Vitamin D deficiency   Neck pain   Relevant Orders   MR Cervical Spine Wo Contrast   Paroxysmal tachycardia, unspecified (HCC) - Primary    Patient has not had any further issues with this since her original issues.        Other Visit Diagnoses     New daily persistent headache       Relevant Orders   MR Angiogram Head Wo Contrast       Return in about 3 months (around 08/18/2022) for F/U.   Total time spent: 30 minutes  Miki Kins, FNP  05/18/2022   This document may have been prepared by [facility] Voice Recognition software and as such may include unintentional dictation errors.

## 2022-05-19 ENCOUNTER — Encounter: Payer: Self-pay | Admitting: Family

## 2022-05-19 DIAGNOSIS — M719 Bursopathy, unspecified: Secondary | ICD-10-CM | POA: Insufficient documentation

## 2022-05-19 NOTE — Assessment & Plan Note (Signed)
Patient has not had any further issues with this since her original issues.

## 2022-05-19 NOTE — Addendum Note (Signed)
Addended by: Grayling Congress on: 05/19/2022 05:14 PM   Modules accepted: Orders

## 2022-05-24 ENCOUNTER — Telehealth: Payer: Self-pay

## 2022-05-24 NOTE — Telephone Encounter (Signed)
Pt emailed me asking if you can send rx ozempic or wegovy to aid her in losing weight. She asked about pain rx refill since she hasn't heard from the pain clinic yet please advise

## 2022-05-30 NOTE — Telephone Encounter (Signed)
Referral was sent.

## 2022-05-31 ENCOUNTER — Other Ambulatory Visit: Payer: Self-pay | Admitting: Family

## 2022-05-31 MED ORDER — OXYCODONE HCL 10 MG PO TABS: 10.0000 mg | ORAL_TABLET | Freq: Two times a day (BID) | ORAL | 0 refills | Status: DC | PRN

## 2022-06-16 ENCOUNTER — Telehealth: Payer: Self-pay | Admitting: Family

## 2022-06-16 NOTE — Telephone Encounter (Signed)
Patient left VM that she has a knot on her throat that is sometimes painful. 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 She would like you to order this and wanted to make you aware of this.

## 2022-06-18 ENCOUNTER — Other Ambulatory Visit: Payer: Self-pay | Admitting: Family

## 2022-06-18 DIAGNOSIS — R5383 Other fatigue: Secondary | ICD-10-CM

## 2022-06-19 LAB — CMP14+EGFR
ALT: 15 IU/L (ref 0–32)
AST: 14 IU/L (ref 0–40)
Albumin: 4.7 g/dL (ref 3.9–4.9)
Alkaline Phosphatase: 95 IU/L (ref 44–121)
BUN/Creatinine Ratio: 12 (ref 12–28)
BUN: 9 mg/dL (ref 8–27)
Bilirubin Total: 0.3 mg/dL (ref 0.0–1.2)
CO2: 24 mmol/L (ref 20–29)
Calcium: 10 mg/dL (ref 8.7–10.3)
Chloride: 102 mmol/L (ref 96–106)
Creatinine, Ser: 0.78 mg/dL (ref 0.57–1.00)
Globulin, Total: 2.7 g/dL (ref 1.5–4.5)
Glucose: 96 mg/dL (ref 70–99)
Potassium: 5.2 mmol/L (ref 3.5–5.2)
Sodium: 141 mmol/L (ref 134–144)
Total Protein: 7.4 g/dL (ref 6.0–8.5)
eGFR: 85 mL/min/{1.73_m2} (ref >59)

## 2022-06-19 LAB — TSH+T4F+T3FREE
Free T4: 0.89 ng/dL (ref 0.82–1.77)
T3, Free: 2.6 pg/mL (ref 2.0–4.4)
TSH: 1.7 u[IU]/mL (ref 0.450–4.500)

## 2022-06-22 ENCOUNTER — Telehealth: Payer: Self-pay | Admitting: Family

## 2022-06-22 ENCOUNTER — Other Ambulatory Visit: Payer: Self-pay | Admitting: Family

## 2022-06-22 NOTE — Telephone Encounter (Signed)
Patient left VM that [facility] has not received the MRI order. Please advise.

## 2022-07-05 ENCOUNTER — Other Ambulatory Visit: Payer: Self-pay | Admitting: Family

## 2022-07-06 ENCOUNTER — Other Ambulatory Visit: Payer: Self-pay

## 2022-07-07 ENCOUNTER — Telehealth: Payer: Self-pay | Admitting: Family

## 2022-07-07 MED ORDER — OXYCODONE HCL 10 MG PO TABS: 10.0000 mg | ORAL_TABLET | Freq: Two times a day (BID) | ORAL | 0 refills | Status: DC | PRN

## 2022-07-07 NOTE — Telephone Encounter (Signed)
Patient left VM that the pain clinic did not receive the referral that we sent. She also needs her pain meds sent - this has been completed by Marchelle Folks. Please fax referral.

## 2022-07-15 ENCOUNTER — Telehealth: Payer: Self-pay | Admitting: Family

## 2022-07-15 NOTE — Telephone Encounter (Signed)
Patient left VM that she went to [facility] and has contravitus (sp?) says they prescribed her polymyxin desulfate and trimethoprin. States that her eyes are still swollen, itchy and watery and these medications are not helping. Can we send her in something else? Please advise.

## 2022-07-16 MED ORDER — CIPROFLOXACIN HCL 0.3 % OP SOLN: 2.0000 [drp] | OPHTHALMIC | 0 refills | Status: DC

## 2022-07-16 NOTE — Telephone Encounter (Signed)
Done

## 2022-07-16 NOTE — Telephone Encounter (Signed)
Insurance will not pay for wegovy and ozempic, she will not qualify for this patient needs to be called  about if she still needs pain meds

## 2022-07-16 NOTE — Telephone Encounter (Signed)
Faxed and order already placed

## 2022-07-18 ENCOUNTER — Other Ambulatory Visit: Payer: Self-pay | Admitting: Internal Medicine

## 2022-07-18 MED ORDER — OLOPATADINE HCL 0.2 % OP SOLN: 1.0000 [drp] | Freq: Every day | OPHTHALMIC | 0 refills | Status: AC

## 2022-07-22 ENCOUNTER — Ambulatory Visit: Payer: 59 | Admitting: Family

## 2022-07-30 ENCOUNTER — Ambulatory Visit: Payer: 59 | Admitting: Family

## 2022-07-30 ENCOUNTER — Encounter: Payer: Self-pay | Admitting: Family

## 2022-08-02 ENCOUNTER — Ambulatory Visit: Payer: 59 | Admitting: Family

## 2022-08-02 ENCOUNTER — Encounter: Payer: Self-pay | Admitting: Family

## 2022-08-02 VITALS — BP 140/88 | HR 73 | Ht 61.0 in | Wt 160.4 lb

## 2022-08-02 DIAGNOSIS — M542 Cervicalgia: Secondary | ICD-10-CM | POA: Diagnosis not present

## 2022-08-02 DIAGNOSIS — M5489 Other dorsalgia: Secondary | ICD-10-CM

## 2022-08-02 DIAGNOSIS — M5412 Radiculopathy, cervical region: Secondary | ICD-10-CM | POA: Diagnosis not present

## 2022-08-02 DIAGNOSIS — M79641 Pain in right hand: Secondary | ICD-10-CM

## 2022-08-02 DIAGNOSIS — M47812 Spondylosis without myelopathy or radiculopathy, cervical region: Secondary | ICD-10-CM

## 2022-08-09 ENCOUNTER — Telehealth: Payer: Self-pay | Admitting: Family

## 2022-08-09 NOTE — Telephone Encounter (Signed)
Patient called in requesting her MRI results. I see them scanned in but can you interpret them please?  She also states Marchelle Folks is supposed to fill her pain meds for now, can you please send those in?

## 2022-08-10 ENCOUNTER — Other Ambulatory Visit: Payer: Self-pay | Admitting: Family

## 2022-08-11 MED ORDER — OXYCODONE HCL 10 MG PO TABS: 10.0000 mg | ORAL_TABLET | Freq: Two times a day (BID) | ORAL | 0 refills | Status: DC | PRN

## 2022-08-20 ENCOUNTER — Telehealth: Payer: Self-pay

## 2022-08-23 ENCOUNTER — Encounter: Payer: Self-pay | Admitting: Family

## 2022-08-23 NOTE — Progress Notes (Signed)
Established Patient Office Visit  Subjective:  Patient ID: Carmen Grimes, female    DOB: 11-21-1960  Age: 62 y.o. MRN: [id number]  Chief Complaint  Patient presents with   Hand Pain    Pain in thumb and hand, MRI results.    Hand Pain  There was no injury mechanism. The pain is present in the right hand. The quality of the pain is described as aching and shooting. The pain does not radiate. The pain is moderate. The pain has been Fluctuating since the incident. The symptoms are aggravated by movement and lifting. She has tried acetaminophen, heat, ice, immobilization, NSAIDs and rest for the symptoms. The treatment provided mild relief.    No other concerns at this time.   Past Medical History:  Diagnosis Date   Anxiety state 10/18/2017   Asthma    controlled   Dyspnea 12/2017   difficulty getting her breath at all times   Esophageal reflux 06/26/2017   Formatting of this note might be different from the original.  06/23/18: UGI series - Mild GERD.   Former cigarette smoker 03/09/2018   GERD (gastroesophageal reflux disease)    H. pylori infection 06/19/2018   Formatting of this note might be different from the original.  06/18/08 - H pylori breath test +. S/p amoxicillin + clarithromycin + omeprazole BID x 10 days. Omeprazole BID continued x 2 months for empiric treatment of possible gastritis.   H/O chest tube placement    H/O: GI bleed    Hiatal hernia    History of bronchoscopy 03/30/2018   History of thoracic surgery 03/30/2018   Hypertension    Lung mass    Mitral valve prolapse    Osteoarthritis    knees   Other nonspecific abnormal finding of lung field 11/18/2017   Other pneumothorax 12/12/2017   Stomach ulcer     Past Surgical History:  Procedure Laterality Date   ABDOMINAL HYSTERECTOMY  2002   ANTERIOR FUSION CERVICAL SPINE  2009   BREAST CYST ASPIRATION Right    COLONOSCOPY  2013   normal   ELECTROMAGNETIC NAVIGATION BROCHOSCOPY Left 12/12/2017    Procedure: ELECTROMAGNETIC NAVIGATION BRONCHOSCOPY;  Surgeon: Erin Fulling, MD;  Location: [facility];  Service: Cardiopulmonary;  Laterality: Left;   ESOPHAGOGASTRODUODENOSCOPY  1990   GI bleed   FLEXIBLE BRONCHOSCOPY Left 12/12/2017   Procedure: FLEXIBLE BRONCHOSCOPY;  Surgeon: Erin Fulling, MD;  Location: [facility];  Service: Cardiopulmonary;  Laterality: Left;   KNEE ARTHROSCOPY Right 2010   x3   KNEE SURGERY Left    Arthroscopy x1   LAPAROSCOPIC SUPRACERVICAL HYSTERECTOMY  2002   with BSO for fibroids/ AUB/ pelvic pain   LAPAROSCOPIC TUBAL LIGATION  1982   LUNG BIOPSY     SHOULDER SURGERY Right    rotator cuff repair   SHOULDER SURGERY Right 2011   second rotator cuff repair   tubal reanastamosis  1985    Social History   Socioeconomic History   Marital status: Divorced    Spouse name: Not on file   Number of children: 3   Years of education: Not on file   Highest education level: Not on file  Occupational History   Occupation: CMA and phlebotomist    Comment: not working but would like to  Tobacco Use   Smoking status: Former    Current packs/day: 1.00    Average packs/day: 1 pack/day for 13.0 years (13.0 ttl pk-yrs)    Types: Cigarettes   Smokeless tobacco: Never  Vaping  Use   Vaping status: Never Used  Substance and Sexual Activity   Alcohol use: Not Currently   Drug use: No   Sexual activity: Not Currently    Birth control/protection: Post-menopausal  Other Topics Concern   Not on file  Social History Narrative   Independent at baseline.  Lives at home by herself   Social Determinants of Health   Financial Resource Strain: Not on file  Food Insecurity: Not on file  Transportation Needs: Not on file  Physical Activity: Sufficiently Active (01/24/2017)   Exercise Vital Sign    Days of Exercise per Week: 5 days    Minutes of Exercise per Session: 50 min  Stress: No Stress Concern Present (01/24/2017)   [employer] of Occupational Health - Occupational  Stress Questionnaire    Feeling of Stress : Not at all  Social Connections: Somewhat Isolated (01/24/2017)   Social Connection and Isolation Panel [NHANES]    Frequency of Communication with Friends and Family: More than three times a week    Frequency of Social Gatherings with Friends and Family: More than three times a week    Attends Religious Services: More than 4 times per year    Active Member of [employer] or Organizations: No    Attends Banker Meetings: Never    Marital Status: Divorced  Catering manager Violence: Not At Risk (01/24/2017)   Humiliation, Afraid, Rape, and Kick questionnaire    Fear of Current or Ex-Partner: No    Emotionally Abused: No    Physically Abused: No    Sexually Abused: No    Family History  Problem Relation Age of Onset   Diabetes Mother    Dementia Mother        Alzheimers   Hypertension Mother    Glaucoma Mother    Prostate cancer Father 50   Stroke Father    Arthritis Father    Osteoporosis Father    Prostate cancer Brother    Breast cancer Sister 73   Uterine cancer Paternal Aunt        1 of 4 paternal aunts   Ovarian cancer Maternal Aunt        1 of 5 maternal aunts    Allergies  Allergen Reactions   Ascorbic Acid Hives and Nausea And Vomiting    HIVES / VOMITING   Celecoxib Other (See Comments), Nausea And Vomiting and Shortness Of Breath    GI bleed  Other reaction(s): gi distress, Other (comments), Other (See Comments)  GI BLEED  GI bleeding  GI Bleeding  Other reaction(s): gi distress, Other (See Comments)  GI bleed  Other reaction(s): gi distress, Other (comments), Other (See Comments)  GI BLEED            GI bleeding    GI BLEED    GI Bleeding      GI bleed  Other reaction(s): gi distress, Other (comments), Other (See Comments)  GI BLEED  GI BLEED  GI Bleeding  GI Bleeding    GI BLEED    GI bleed Other reaction(s): gi distress, Other (comments), Other (See Comments) GI BLEED      GI  bleeding  GI BLEED  GI Bleeding   GI bleed Other reaction(s): gi distress, Other (comments), Other (See Comments) GI BLEED    Other reaction(s): gi distress, Other (See Comments) GI bleed Other reaction(s): gi distress, Other (comments), Other (See Comments) GI BLEED      GI bleeding  GI BLEED  GI Bleeding  GI bleed Other reaction(s): gi distress, Other (comments), Other (See Comments) GI BLEED GI BLEED GI Bleeding   Doxycycline Other (See Comments), Nausea And Vomiting and Shortness Of Breath    Unknown reaction.  Other reaction(s): Other (See Comments), Unknown, Unknown (comments)  Pt does not remember  Other reaction(s): Other (See Comments), Other (See Comments), unknown, Unknown (comments)  Unknown reaction.  Other reaction(s): Other (See Comments), Unknown, Unknown (comments)  Pt does not remember  Pt does not remember  Pt does not remember    Unknown reaction. Other reaction(s): Other (See Comments), Unknown, Unknown (comments) Pt does not remember    Other reaction(s): Other (See Comments), Other (See Comments), unknown, Unknown (comments) Unknown reaction. Other reaction(s): Other (See Comments), Unknown, Unknown (comments) Pt does not remember Pt does not remember   Gabapentin Nausea And Vomiting, Other (See Comments) and Shortness Of Breath    Drowsiness.  Like a zombie, not functional  Other reaction(s): Other (See Comments), Other (See Comments), Unknown, Unknown (comments)  Drowsiness.  Like a zombie, not functional  Drowsiness, lethargy  Drowsiness, lethargy    Drowsiness. Like a zombie, not functional    Other reaction(s): Other (See Comments), Other (See Comments), Unknown, Unknown (comments) Drowsiness. Like a zombie, not functional Drowsiness, lethargy  Drowsiness, lethargy   Shellfish Allergy Anaphylaxis    Other reaction(s): anaphylaxis/angioedema  Betadine on skin is okay  Other reaction(s): anaphylaxis/angioedema Other reaction(s):  anaphylaxis/angioedema  Betadine on skin is okay   Strawberry Extract Hives, Nausea And Vomiting, Nausea Only and Rash    Severe vomitting  Other Reaction(s): Skin Rashes, Hives   Tape Dermatitis    Patient stated Tegaderm after surgery as well as a clear tape tear off her top skin layer and leave her raw. Was told to notify team allergy to tape.    Tramadol Other (See Comments)    "blacked out", found wandering in the street  Other Reaction(s): Not available, Other  Other Reaction(s): neurological reaction    confusion    Altered mental status, confusion    CONFUSION    "blacked out", found wandering in the street  Other Reaction(s): neurological reaction, Not available, Other (See Comments)    "blacked out", found wandering in the street    Altered mental status, confusion    CONFUSION    confusion    Other Reaction(s): neurological reaction  confusion  Altered mental status, confusion  CONFUSION  "blacked out", found wandering in the street   Ibuprofen Other (See Comments)    "Blood disorder", GI bleed.  Other reaction(s): gi distress, Other (comments), Other (See Comments)  Other Reaction(s): GI bleeding, Not available, Other  GI Bleeding    GI BLEED    "Blood disorder", GI bleed. Other reaction(s): gi distress, Other (comments), Other (See Comments)  Other Reaction(s): Not available, Other (See Comments)    "Blood disorder", GI bleed. Other reaction(s): gi distress, Other (comments), Other (See Comments)    GI BLEED    GI Bleeding    GI Bleeding  GI BLEED  "Blood disorder", GI bleed. Other reaction(s): gi distress, Other (comments), Other (See Comments)   Montelukast Other (See Comments)    Syncope..passed out  Other Reaction(s): Other  Caused Fainting    Passed out    Syncope..passed out  Other Reaction(s): Not available, other/intolerance, Syncope    Syncope..passed out    Passed out    Caused Fainting    Caused Fainting  Passed out   Syncope..passed out   Naproxen Other (See  Comments)    GI bleed  Other reaction(s): Other (comments), Other (See Comments)  Other Reaction(s): GI Bleed, GI bleeding, Not available, Other  GI Bleeding    GI BLEED    GI bleed Other reaction(s): Other (comments), Other (See Comments)  Other Reaction(s): Other (See Comments)    GI bleed Other reaction(s): Other (comments), Other (See Comments)    GI BLEED    GI Bleeding    GI Bleeding  GI BLEED  GI bleed Other reaction(s): Other (comments), Other (See Comments)   Prednisone Other (See Comments)    Tremors, severe  Other Reaction(s): Other (See Comments)  Other reaction(s): Other (See Comments), Other (See Comments)  Tremors, severe  Tremors and mental fogginess with oral prednisone, tolerates topical and inhaled  Tremors and mental fogginess with oral prednisone, tolerates topical and inhaled    Tremors, severe    Other reaction(s): Other (See Comments), Other (See Comments) Tremors, severe Tremors and mental fogginess with oral prednisone, tolerates topical and inhaled   Ascorbate Hives    HIVES / VOMITING   Sesame Oil     Other Reaction(s): Sesame Seed   Soy Allergy     Other Reaction(s): Soybean   Egg-Derived Products Other (See Comments) and Rash    rash  Other reaction(s): Other (See Comments)  rash  rash    Other reaction(s): Other (See Comments) rash   Latex Itching and Rash    Other reaction(s): mild rash/itching  Other Reaction(s): Not available, Skin Rashes, Hives  Other reaction(s): rash/itching   Metronidazole Nausea Only    Fatigue  Pt states she gets extremely nauseas and weak.   Nsaids Nausea And Vomiting and Other (See Comments)    Gastric bleed  Other Reaction(s): GI Bleed, Other  Other reaction(s): Other (See Comments), other/intolerance  Gastric bleed  Gastric bleed  Gastric bleed   Strawberry Flavor Hives   Sulfamethoxazole-Trimethoprim Other (See Comments)    Other reaction(s):  Other (See Comments), other/intolerance  MAKES PAIN WORSE  Increases pain  Other Reaction(s): Other (See Comments)  Pt does not remember  Increases pain    Pt does not remember    MAKES PAIN WORSE    Other reaction(s): Other (See Comments), other/intolerance MAKES PAIN WORSE  Increases pain Increases pain    Review of Systems  Constitutional:  Positive for malaise/fatigue.  Musculoskeletal:  Positive for back pain, joint pain, myalgias and neck pain.  All other systems reviewed and are negative.      Objective:   BP (!) 140/88   Pulse 73   Ht 5\' 1"  (1.549 m)   Wt 160 lb 6.4 oz (72.8 kg)   SpO2 96%   BMI 30.31 kg/m   Vitals:   08/02/22 1253  BP: (!) 140/88  Pulse: 73  Height: 5\' 1"  (1.549 m)  Weight: 160 lb 6.4 oz (72.8 kg)  SpO2: 96%  BMI (Calculated): 30.32    Physical Exam Vitals and nursing note reviewed.  Constitutional:      Appearance: Normal appearance. She is normal weight.  HENT:     Head: Normocephalic.  Eyes:     Extraocular Movements: Extraocular movements intact.     Conjunctiva/sclera: Conjunctivae normal.     Pupils: Pupils are equal, round, and reactive to light.  Cardiovascular:     Rate and Rhythm: Normal rate.  Pulmonary:     Effort: Pulmonary effort is normal.  Musculoskeletal:        General: Tenderness present.     Right hand:  Tenderness present. Decreased range of motion. Decreased strength of finger abduction.  Neurological:     General: No focal deficit present.     Mental Status: She is alert and oriented to person, place, and time. Mental status is at baseline.  Psychiatric:        Mood and Affect: Mood normal.        Behavior: Behavior normal.        Thought Content: Thought content normal.        Judgment: Judgment normal.      No results found for any visits on 08/02/22.  Recent Results (from the past 2160 hour(s))  TSH+T4F+T3Free     Status: None   Collection Time: 06/18/22 12:23 PM  Result Value Ref Range    TSH 1.700 0.450 - 4.500 uIU/mL   T3, Free 2.6 2.0 - 4.4 pg/mL   Free T4 0.89 0.82 - 1.77 ng/dL  [id number]     Status: None   Collection Time: 06/18/22 12:23 PM  Result Value Ref Range   Glucose 96 70 - 99 mg/dL   BUN 9 8 - 27 mg/dL   Creatinine, Ser 3.01 0.57 - 1.00 mg/dL   eGFR 85 >60 FU/XNA/3.55   BUN/Creatinine Ratio 12 12 - 28   Sodium 141 134 - 144 mmol/L   Potassium 5.2 3.5 - 5.2 mmol/L   Chloride 102 96 - 106 mmol/L   CO2 24 20 - 29 mmol/L   Calcium 10.0 8.7 - 10.3 mg/dL   Total Protein 7.4 6.0 - 8.5 g/dL   Albumin 4.7 3.9 - 4.9 g/dL   Globulin, Total 2.7 1.5 - 4.5 g/dL   Bilirubin Total 0.3 0.0 - 1.2 mg/dL   Alkaline Phosphatase 95 44 - 121 IU/L   AST 14 0 - 40 IU/L   ALT 15 0 - 32 IU/L       Assessment & Plan:   Problem List Items Addressed This Visit       Active Problems   Back pain without sciatica   Cervical pain (neck)   Cervical radiculopathy   Cervical spondylosis without myelopathy   Other Visit Diagnoses     Right hand pain    -  Primary   suggested pt use voltaren and compression gloves to help with her pain Sending refill for her meds.     Patient given copies of her referral and her MRI results to give to the pain clinic.  We will get the results from the most recent MRI's and will call her once we have them.   Return as needed..   Total time spent: 20 minutes  Miki Kins, FNP  08/02/2022   This document may have been prepared by [facility] Voice Recognition software and as such may include unintentional dictation errors.

## 2022-08-24 NOTE — Telephone Encounter (Signed)
Patient has been informed.

## 2022-08-24 NOTE — Telephone Encounter (Signed)
Per [facility] verbal we can not do outside provider labs , patient was informed this earlier when she called about labs,but insisted this wasn't the case LM for pt to call back

## 2022-08-25 ENCOUNTER — Telehealth: Payer: Self-pay | Admitting: Family

## 2022-08-25 NOTE — Telephone Encounter (Signed)
Patient left VM requesting a 7 DS of ambien. States the ophthalmologist put her on prednisolone eye drops and they are making her "wired". States they are making it very difficult for her to get any rest. Please advise.

## 2022-08-26 NOTE — Telephone Encounter (Signed)
Spoke with who verbalized understanding.

## 2022-09-07 ENCOUNTER — Other Ambulatory Visit: Payer: Self-pay | Admitting: Family

## 2022-09-07 MED ORDER — OXYCODONE HCL 10 MG PO TABS: 10.0000 mg | ORAL_TABLET | Freq: Two times a day (BID) | ORAL | 0 refills | Status: DC | PRN

## 2022-09-10 ENCOUNTER — Telehealth: Payer: Self-pay | Admitting: Family

## 2022-09-10 ENCOUNTER — Ambulatory Visit (INDEPENDENT_AMBULATORY_CARE_PROVIDER_SITE_OTHER): Payer: 59 | Admitting: Family

## 2022-09-10 ENCOUNTER — Ambulatory Visit
Admission: RE | Admit: 2022-09-10 | Discharge: 2022-09-10 | Disposition: A | Discharge status: Home / Self Care | Payer: 59 | Source: Ambulatory Visit | Attending: Family | Admitting: Family

## 2022-09-10 VITALS — BP 130/71 | HR 63 | Ht 61.0 in | Wt 161.4 lb

## 2022-09-10 DIAGNOSIS — H9202 Otalgia, left ear: Secondary | ICD-10-CM | POA: Diagnosis not present

## 2022-09-10 DIAGNOSIS — D869 Sarcoidosis, unspecified: Secondary | ICD-10-CM

## 2022-09-10 DIAGNOSIS — E041 Nontoxic single thyroid nodule: Secondary | ICD-10-CM

## 2022-09-10 DIAGNOSIS — J029 Acute pharyngitis, unspecified: Secondary | ICD-10-CM | POA: Diagnosis not present

## 2022-09-10 NOTE — Telephone Encounter (Signed)
Patient left VM that she was here this morning and her and Marchelle Folks discussed the pain she has been having in her head, but she forgot to mention that the pain radiates and "shoots" into her ears at times. She is also requesting an order for a thyroid U/S and an referral to ENT. Please advise.

## 2022-09-13 ENCOUNTER — Telehealth: Payer: Self-pay

## 2022-09-14 NOTE — Telephone Encounter (Signed)
See other task

## 2022-09-15 NOTE — Telephone Encounter (Signed)
Patient left another VM about having a sore throat off and on, needing a thyroid U/S, an ENT referral,and states that she has pain in her head and eye radiating into her left ear mainly.

## 2022-09-16 ENCOUNTER — Encounter: Payer: Self-pay | Admitting: Family

## 2022-09-16 ENCOUNTER — Telehealth: Payer: Self-pay | Admitting: Family

## 2022-09-16 DIAGNOSIS — E041 Nontoxic single thyroid nodule: Secondary | ICD-10-CM | POA: Insufficient documentation

## 2022-09-16 NOTE — Progress Notes (Signed)
Established Patient Office Visit  Subjective:  Patient ID: Carmen Grimes, female    DOB: 07/02/1960  Age: 62 y.o. MRN: [id number]  Chief Complaint  Patient presents with   Follow-up    Painful headaches    Patient is here today with multiple complaints.  She has been having headaches and sore throat frequently, and the headaches are radiating to her left ear.   She is also having some difficulty with swallowing.  Does have a nodule on her thyroid, but we need to recheck this.   She saw an eye doctor who suggested that she might want [place] to test for sarcoidosis, given that she had inflammation in her eye.    No other concerns at this time.   Past Medical History:  Diagnosis Date   Anxiety state 10/18/2017   Asthma    controlled   Dyspnea 12/2017   difficulty getting her breath at all times   Esophageal reflux 06/26/2017   Formatting of this note might be different from the original.  06/23/18: UGI series - Mild GERD.   Former cigarette smoker 03/09/2018   GERD (gastroesophageal reflux disease)    H. pylori infection 06/19/2018   Formatting of this note might be different from the original.  06/18/08 - H pylori breath test +. S/p amoxicillin + clarithromycin + omeprazole BID x 10 days. Omeprazole BID continued x 2 months for empiric treatment of possible gastritis.   H/O chest tube placement    H/O: GI bleed    Hiatal hernia    History of bronchoscopy 03/30/2018   History of thoracic surgery 03/30/2018   Hypertension    Lung mass    Mitral valve prolapse    Osteoarthritis    knees   Other nonspecific abnormal finding of lung field 11/18/2017   Other pneumothorax 12/12/2017   Stomach ulcer     Past Surgical History:  Procedure Laterality Date   ABDOMINAL HYSTERECTOMY  2002   ANTERIOR FUSION CERVICAL SPINE  2009   BREAST CYST ASPIRATION Right    COLONOSCOPY  2013   normal   ELECTROMAGNETIC NAVIGATION BROCHOSCOPY Left 12/12/2017   Procedure: ELECTROMAGNETIC  NAVIGATION BRONCHOSCOPY;  Surgeon: Erin Fulling, MD;  Location: [facility];  Service: Cardiopulmonary;  Laterality: Left;   ESOPHAGOGASTRODUODENOSCOPY  1990   GI bleed   FLEXIBLE BRONCHOSCOPY Left 12/12/2017   Procedure: FLEXIBLE BRONCHOSCOPY;  Surgeon: Erin Fulling, MD;  Location: [facility];  Service: Cardiopulmonary;  Laterality: Left;   KNEE ARTHROSCOPY Right 2010   x3   KNEE SURGERY Left    Arthroscopy x1   LAPAROSCOPIC SUPRACERVICAL HYSTERECTOMY  2002   with BSO for fibroids/ AUB/ pelvic pain   LAPAROSCOPIC TUBAL LIGATION  1982   LUNG BIOPSY     SHOULDER SURGERY Right    rotator cuff repair   SHOULDER SURGERY Right 2011   second rotator cuff repair   tubal reanastamosis  1985    Social History   Socioeconomic History   Marital status: Divorced    Spouse name: Not on file   Number of children: 3   Years of education: Not on file   Highest education level: Not on file  Occupational History   Occupation: CMA and phlebotomist    Comment: not working but would like to  Tobacco Use   Smoking status: Former    Current packs/day: 1.00    Average packs/day: 1 pack/day for 13.0 years (13.0 ttl pk-yrs)    Types: Cigarettes   Smokeless tobacco: Never  Vaping  Use   Vaping status: Never Used  Substance and Sexual Activity   Alcohol use: Not Currently   Drug use: No   Sexual activity: Not Currently    Birth control/protection: Post-menopausal  Other Topics Concern   Not on file  Social History Narrative   Independent at baseline.  Lives at home by herself   Social Determinants of Health   Financial Resource Strain: Not on file  Food Insecurity: Not on file  Transportation Needs: Not on file  Physical Activity: Sufficiently Active (01/24/2017)   Exercise Vital Sign    Days of Exercise per Week: 5 days    Minutes of Exercise per Session: 50 min  Stress: No Stress Concern Present (01/24/2017)   [employer] of Occupational Health - Occupational Stress Questionnaire     Feeling of Stress : Not at all  Social Connections: Somewhat Isolated (01/24/2017)   Social Connection and Isolation Panel [NHANES]    Frequency of Communication with Friends and Family: More than three times a week    Frequency of Social Gatherings with Friends and Family: More than three times a week    Attends Religious Services: More than 4 times per year    Active Member of [employer] or Organizations: No    Attends Banker Meetings: Never    Marital Status: Divorced  Catering manager Violence: Not At Risk (01/24/2017)   Humiliation, Afraid, Rape, and Kick questionnaire    Fear of Current or Ex-Partner: No    Emotionally Abused: No    Physically Abused: No    Sexually Abused: No    Family History  Problem Relation Age of Onset   Diabetes Mother    Dementia Mother        Alzheimers   Hypertension Mother    Glaucoma Mother    Prostate cancer Father 57   Stroke Father    Arthritis Father    Osteoporosis Father    Prostate cancer Brother    Breast cancer Sister 18   Uterine cancer Paternal Aunt        1 of 4 paternal aunts   Ovarian cancer Maternal Aunt        1 of 5 maternal aunts    Allergies  Allergen Reactions   Ascorbic Acid Hives and Nausea And Vomiting    HIVES / VOMITING   Celecoxib Other (See Comments), Nausea And Vomiting and Shortness Of Breath    GI bleed  Other reaction(s): gi distress, Other (comments), Other (See Comments)  GI BLEED  GI bleeding  GI Bleeding  Other reaction(s): gi distress, Other (See Comments)  GI bleed  Other reaction(s): gi distress, Other (comments), Other (See Comments)  GI BLEED            GI bleeding    GI BLEED    GI Bleeding      GI bleed  Other reaction(s): gi distress, Other (comments), Other (See Comments)  GI BLEED  GI BLEED  GI Bleeding  GI Bleeding    GI BLEED    GI bleed Other reaction(s): gi distress, Other (comments), Other (See Comments) GI BLEED      GI bleeding  GI BLEED  GI  Bleeding   GI bleed Other reaction(s): gi distress, Other (comments), Other (See Comments) GI BLEED    Other reaction(s): gi distress, Other (See Comments) GI bleed Other reaction(s): gi distress, Other (comments), Other (See Comments) GI BLEED      GI bleeding  GI BLEED  GI Bleeding  GI bleed Other reaction(s): gi distress, Other (comments), Other (See Comments) GI BLEED GI BLEED GI Bleeding   Doxycycline Other (See Comments), Nausea And Vomiting and Shortness Of Breath    Unknown reaction.  Other reaction(s): Other (See Comments), Unknown, Unknown (comments)  Pt does not remember  Other reaction(s): Other (See Comments), Other (See Comments), unknown, Unknown (comments)  Unknown reaction.  Other reaction(s): Other (See Comments), Unknown, Unknown (comments)  Pt does not remember  Pt does not remember  Pt does not remember    Unknown reaction. Other reaction(s): Other (See Comments), Unknown, Unknown (comments) Pt does not remember    Other reaction(s): Other (See Comments), Other (See Comments), unknown, Unknown (comments) Unknown reaction. Other reaction(s): Other (See Comments), Unknown, Unknown (comments) Pt does not remember Pt does not remember   Gabapentin Nausea And Vomiting, Other (See Comments) and Shortness Of Breath    Drowsiness.  Like a zombie, not functional  Other reaction(s): Other (See Comments), Other (See Comments), Unknown, Unknown (comments)  Drowsiness.  Like a zombie, not functional  Drowsiness, lethargy  Drowsiness, lethargy    Drowsiness. Like a zombie, not functional    Other reaction(s): Other (See Comments), Other (See Comments), Unknown, Unknown (comments) Drowsiness. Like a zombie, not functional Drowsiness, lethargy  Drowsiness, lethargy   Shellfish Allergy Anaphylaxis    Other reaction(s): anaphylaxis/angioedema  Betadine on skin is okay  Other reaction(s): anaphylaxis/angioedema Other reaction(s): anaphylaxis/angioedema  Betadine on skin  is okay   Strawberry Extract Hives, Nausea And Vomiting, Nausea Only and Rash    Severe vomitting  Other Reaction(s): Skin Rashes, Hives   Tape Dermatitis    Patient stated Tegaderm after surgery as well as a clear tape tear off her top skin layer and leave her raw. Was told to notify team allergy to tape.    Tramadol Other (See Comments)    "blacked out", found wandering in the street  Other Reaction(s): Not available, Other  Other Reaction(s): neurological reaction    confusion    Altered mental status, confusion    CONFUSION    "blacked out", found wandering in the street  Other Reaction(s): neurological reaction, Not available, Other (See Comments)    "blacked out", found wandering in the street    Altered mental status, confusion    CONFUSION    confusion    Other Reaction(s): neurological reaction  confusion  Altered mental status, confusion  CONFUSION  "blacked out", found wandering in the street   Ibuprofen Other (See Comments)    "Blood disorder", GI bleed.  Other reaction(s): gi distress, Other (comments), Other (See Comments)  Other Reaction(s): GI bleeding, Not available, Other  GI Bleeding    GI BLEED    "Blood disorder", GI bleed. Other reaction(s): gi distress, Other (comments), Other (See Comments)  Other Reaction(s): Not available, Other (See Comments)    "Blood disorder", GI bleed. Other reaction(s): gi distress, Other (comments), Other (See Comments)    GI BLEED    GI Bleeding    GI Bleeding  GI BLEED  "Blood disorder", GI bleed. Other reaction(s): gi distress, Other (comments), Other (See Comments)   Montelukast Other (See Comments)    Syncope..passed out  Other Reaction(s): Other  Caused Fainting    Passed out    Syncope..passed out  Other Reaction(s): Not available, other/intolerance, Syncope    Syncope..passed out    Passed out    Caused Fainting    Caused Fainting  Passed out  Syncope..passed out   Naproxen Other (See  Comments)    GI bleed  Other reaction(s): Other (comments), Other (See Comments)  Other Reaction(s): GI Bleed, GI bleeding, Not available, Other  GI Bleeding    GI BLEED    GI bleed Other reaction(s): Other (comments), Other (See Comments)  Other Reaction(s): Other (See Comments)    GI bleed Other reaction(s): Other (comments), Other (See Comments)    GI BLEED    GI Bleeding    GI Bleeding  GI BLEED  GI bleed Other reaction(s): Other (comments), Other (See Comments)   Prednisone Other (See Comments)    Tremors, severe  Other Reaction(s): Other (See Comments)  Other reaction(s): Other (See Comments), Other (See Comments)  Tremors, severe  Tremors and mental fogginess with oral prednisone, tolerates topical and inhaled  Tremors and mental fogginess with oral prednisone, tolerates topical and inhaled    Tremors, severe    Other reaction(s): Other (See Comments), Other (See Comments) Tremors, severe Tremors and mental fogginess with oral prednisone, tolerates topical and inhaled   Ascorbate Hives    HIVES / VOMITING   Sesame Oil     Other Reaction(s): Sesame Seed   Soy Allergy     Other Reaction(s): Soybean   Egg-Derived Products Other (See Comments) and Rash    rash  Other reaction(s): Other (See Comments)  rash  rash    Other reaction(s): Other (See Comments) rash   Latex Itching and Rash    Other reaction(s): mild rash/itching  Other Reaction(s): Not available, Skin Rashes, Hives  Other reaction(s): rash/itching   Metronidazole Nausea Only    Fatigue  Pt states she gets extremely nauseas and weak.   Nsaids Nausea And Vomiting and Other (See Comments)    Gastric bleed  Other Reaction(s): GI Bleed, Other  Other reaction(s): Other (See Comments), other/intolerance  Gastric bleed  Gastric bleed  Gastric bleed   Strawberry Flavor Hives   Sulfamethoxazole-Trimethoprim Other (See Comments)    Other reaction(s): Other (See Comments),  other/intolerance  MAKES PAIN WORSE  Increases pain  Other Reaction(s): Other (See Comments)  Pt does not remember  Increases pain    Pt does not remember    MAKES PAIN WORSE    Other reaction(s): Other (See Comments), other/intolerance MAKES PAIN WORSE  Increases pain Increases pain    Review of Systems  HENT:  Positive for congestion, ear pain, sinus pain and sore throat.   Neurological:  Positive for headaches.  All other systems reviewed and are negative.      Objective:   BP 130/71   Pulse 63   Ht 5\' 1"  (1.549 m)   Wt 161 lb 6.4 oz (73.2 kg)   SpO2 97%   BMI 30.50 kg/m   Vitals:   09/10/22 1053  BP: 130/71  Pulse: 63  Height: 5\' 1"  (1.549 m)  Weight: 161 lb 6.4 oz (73.2 kg)  SpO2: 97%  BMI (Calculated): 30.51    Physical Exam Vitals and nursing note reviewed.  Constitutional:      Appearance: Normal appearance. She is normal weight.  HENT:     Head: Normocephalic.     Right Ear: Tympanic membrane normal.     Left Ear: Tympanic membrane normal.     Nose: Congestion present.  Eyes:     Conjunctiva/sclera: Conjunctivae normal.     Pupils: Pupils are equal, round, and reactive to light.  Cardiovascular:     Rate and Rhythm: Bradycardia present.  Pulmonary:     Effort: Pulmonary effort is normal.  Musculoskeletal:  General: Normal range of motion.  Neurological:     General: No focal deficit present.     Mental Status: She is alert and oriented to person, place, and time. Mental status is at baseline.  Psychiatric:        Attention and Perception: Attention and perception normal.        Mood and Affect: Mood is anxious.        Speech: Speech normal.        Behavior: Behavior normal. Behavior is cooperative.        Thought Content: Thought content normal.        Cognition and Memory: Cognition and memory normal.        Judgment: Judgment normal.      No results found for any visits on 09/10/22.  No results found for this or any  previous visit (from the past 2160 hour(s)).     Assessment & Plan:   Problem List Items Addressed This Visit       Active Problems   Thyroid nodule    Will get an ultrasound of her thyroid to recheck the nodule.        Relevant Orders   US THYROID   Other Visit Diagnoses     Sarcoidosis    -  Primary   Will check some labs today to see if these show abnormalities.  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  Will call with results.   Relevant Orders   CMP14+EGFR   TSH   CBC with Differential/Platelet   ANA 12 Profile, Do All RDL   Sed Rate (ESR)   ACE (Angiotensin 1 Converting Enzyme)   Vitamin D (25 hydroxy)   Vitamin D 1,25 Dihydroxy   DG Chest 1 View   CRP (C-Reactive Protein)   Ear pain, left       Will send referral to ENT, given that her pharyngitis appears to be recurrent and chronic at this point.  Also due to her sinus and ear pain.   Relevant Orders   Ambulatory referral to ENT   Pharyngitis, unspecified etiology       Will send referral to ENT, given that her pharyngitis appears to be recurrent and chronic at this point.  Also due to her sinus and ear pain.   Relevant Orders   Ambulatory referral to ENT       Return in about 6 weeks (around 10/22/2022) for F/U.   Total time spent: 30 minutes  Miki Kins, FNP  09/10/2022   This document may have been prepared by [facility] Voice Recognition software and as such may include unintentional dictation errors.

## 2022-09-16 NOTE — Telephone Encounter (Signed)
Pt would like to get an ultrasound of her thyroid. Pt would like to go to [facility] in [place]. Fax # [phone]  Pt also stated that yall spoke in her appt about a referall to the ENT for her eye problems and headaches. Fax #[phone]  I am not sure if you guys spoke about this or not but I did not see the referrals so wanted to ask and pt wanted to give me fax numbers as well

## 2022-09-16 NOTE — Assessment & Plan Note (Signed)
Will get an ultrasound of her thyroid to recheck the nodule.

## 2022-09-17 NOTE — Telephone Encounter (Signed)
Carmen Grimes has already taken care of

## 2022-09-17 NOTE — Telephone Encounter (Signed)
Marchelle Folks has already taken care of

## 2022-09-27 LAB — ANA TITER AND PATTERN: Speckled Pattern: 1:160 {titer} — ABNORMAL HIGH

## 2022-09-27 LAB — CMP14+EGFR
ALT: 18 IU/L (ref 0–32)
AST: 26 IU/L (ref 0–40)
Albumin: 4.8 g/dL (ref 3.9–4.9)
Alkaline Phosphatase: 99 IU/L (ref 44–121)
BUN/Creatinine Ratio: 11 — ABNORMAL LOW (ref 12–28)
BUN: 8 mg/dL (ref 8–27)
Bilirubin Total: 0.3 mg/dL (ref 0.0–1.2)
CO2: 22 mmol/L (ref 20–29)
Calcium: 10.3 mg/dL (ref 8.7–10.3)
Chloride: 100 mmol/L (ref 96–106)
Creatinine, Ser: 0.74 mg/dL (ref 0.57–1.00)
Globulin, Total: 2.4 g/dL (ref 1.5–4.5)
Glucose: 75 mg/dL (ref 70–99)
Potassium: 4.2 mmol/L (ref 3.5–5.2)
Sodium: 139 mmol/L (ref 134–144)
Total Protein: 7.2 g/dL (ref 6.0–8.5)
eGFR: 90 mL/min/{1.73_m2} (ref >59)

## 2022-09-27 LAB — ANA 12 PROFILE, DO ALL RDL
Anti-Cardiolipin Ab, IgA (RDL): <12 APL U/mL (ref <12)
Anti-Cardiolipin Ab, IgG (RDL): <15 GPL U/mL (ref <15)
Anti-Cardiolipin Ab, IgM (RDL): <13 MPL U/mL (ref <13)
Anti-Centromere Ab (RDL): <1:40 {titer}
Anti-La (SS-B) Ab (RDL): <20 Units (ref <20)
Anti-Nuclear Ab by IFA (RDL): POSITIVE — AB
Anti-Ro (SS-A) Ab (RDL): <20 Units (ref <20)
Anti-Scl-70 Ab (RDL): <20 Units (ref <20)
Anti-Sm Ab (RDL): <20 Units (ref <20)
Anti-TPO Ab (RDL): <9 IU/mL (ref <9.0)
Anti-U1 RNP Ab (RDL): <20 Units (ref <20)
Anti-dsDNA Ab by Farr(RDL): <8 IU/mL (ref <8.0)
C3 Complement (RDL): 164 mg/dL (ref 82–167)
C4 Complement (RDL): 44 mg/dL (ref 14–44)

## 2022-09-27 LAB — TSH: TSH: 1.65 u[IU]/mL (ref 0.450–4.500)

## 2022-09-27 LAB — VITAMIN D 1,25 DIHYDROXY
Vitamin D 1, 25 (OH)2 Total: 38 pg/mL
Vitamin D2 1, 25 (OH)2: 15 pg/mL
Vitamin D3 1, 25 (OH)2: 23 pg/mL

## 2022-09-27 LAB — C-REACTIVE PROTEIN: CRP: <1 mg/L (ref 0–10)

## 2022-09-27 LAB — SEDIMENTATION RATE: Sed Rate: 2 mm/hr (ref 0–40)

## 2022-09-27 LAB — ANGIOTENSIN CONVERTING ENZYME: Angio Convert Enzyme: 18 U/L (ref 14–82)

## 2022-09-27 LAB — VITAMIN D 25 HYDROXY (VIT D DEFICIENCY, FRACTURES): Vit D, 25-Hydroxy: 44.1 ng/mL (ref 30.0–100.0)

## 2022-10-06 DIAGNOSIS — M25562 Pain in left knee: Secondary | ICD-10-CM | POA: Insufficient documentation

## 2022-10-06 DIAGNOSIS — M25561 Pain in right knee: Secondary | ICD-10-CM | POA: Insufficient documentation

## 2022-10-08 ENCOUNTER — Encounter: Payer: Self-pay | Admitting: Family

## 2022-10-19 ENCOUNTER — Ambulatory Visit (INDEPENDENT_AMBULATORY_CARE_PROVIDER_SITE_OTHER): Payer: 59 | Admitting: Family

## 2022-10-19 ENCOUNTER — Encounter: Payer: Self-pay | Admitting: Family

## 2022-10-19 VITALS — BP 130/82 | HR 78 | Ht 61.0 in | Wt 159.6 lb

## 2022-10-19 DIAGNOSIS — M791 Myalgia, unspecified site: Secondary | ICD-10-CM | POA: Diagnosis not present

## 2022-10-19 DIAGNOSIS — M1990 Unspecified osteoarthritis, unspecified site: Secondary | ICD-10-CM

## 2022-10-19 DIAGNOSIS — Z0130 Encounter for examination of blood pressure without abnormal findings: Secondary | ICD-10-CM

## 2022-10-21 ENCOUNTER — Ambulatory Visit: Payer: 59 | Admitting: Family

## 2022-11-01 ENCOUNTER — Encounter: Payer: Self-pay | Admitting: Family

## 2022-11-02 ENCOUNTER — Encounter: Payer: Self-pay | Admitting: Family

## 2022-11-14 NOTE — Progress Notes (Signed)
Annual Wellness Visit  Patient: Carmen Grimes, Female    DOB: 1960/04/06, 62 y.o.   MRN: [id number] Visit Date: 11/14/2022  Today's Provider: Miki Kins, FNP  Subjective:    Chief Complaint  Patient presents with   Annual Exam    AWV and 6 week follow up   Carmen Grimes is a 62 y.o. female who presents today for her Annual Wellness Visit.  HPI  Past Medical History:  Diagnosis Date   Anxiety state 10/18/2017   Asthma    controlled   Dyspnea 12/2017   difficulty getting her breath at all times   Esophageal reflux 06/26/2017   Formatting of this note might be different from the original.  06/23/18: UGI series - Mild GERD.   Former cigarette smoker 03/09/2018   GERD (gastroesophageal reflux disease)    H. pylori infection 06/19/2018   Formatting of this note might be different from the original.  06/18/08 - H pylori breath test +. S/p amoxicillin + clarithromycin + omeprazole BID x 10 days. Omeprazole BID continued x 2 months for empiric treatment of possible gastritis.   H/O chest tube placement    H/O: GI bleed    Hiatal hernia    History of bronchoscopy 03/30/2018   History of thoracic surgery 03/30/2018   Hypertension    Lung mass    Mitral valve prolapse    Osteoarthritis    knees   Other nonspecific abnormal finding of lung field 11/18/2017   Other pneumothorax 12/12/2017   Stomach ulcer    Past Surgical History:  Procedure Laterality Date   ABDOMINAL HYSTERECTOMY  2002   ANTERIOR FUSION CERVICAL SPINE  2009   BREAST CYST ASPIRATION Right    COLONOSCOPY  2013   normal   ELECTROMAGNETIC NAVIGATION BROCHOSCOPY Left 12/12/2017   Procedure: ELECTROMAGNETIC NAVIGATION BRONCHOSCOPY;  Surgeon: Erin Fulling, MD;  Location: [facility];  Service: Cardiopulmonary;  Laterality: Left;   ESOPHAGOGASTRODUODENOSCOPY  1990   GI bleed   FLEXIBLE BRONCHOSCOPY Left 12/12/2017   Procedure: FLEXIBLE BRONCHOSCOPY;  Surgeon: Erin Fulling, MD;  Location: [facility];  Service:  Cardiopulmonary;  Laterality: Left;   KNEE ARTHROSCOPY Right 2010   x3   KNEE SURGERY Left    Arthroscopy x1   LAPAROSCOPIC SUPRACERVICAL HYSTERECTOMY  2002   with BSO for fibroids/ AUB/ pelvic pain   LAPAROSCOPIC TUBAL LIGATION  1982   LUNG BIOPSY     SHOULDER SURGERY Right    rotator cuff repair   SHOULDER SURGERY Right 2011   second rotator cuff repair   tubal reanastamosis  1985   Family History  Problem Relation Age of Onset   Diabetes Mother    Dementia Mother        Alzheimers   Hypertension Mother    Glaucoma Mother    Prostate cancer Father 54   Stroke Father    Arthritis Father    Osteoporosis Father    Prostate cancer Brother    Breast cancer Sister 3   Uterine cancer Paternal Aunt        1 of 4 paternal aunts   Ovarian cancer Maternal Aunt        1 of 5 maternal aunts   Social History   Socioeconomic History   Marital status: Single    Spouse name: Not on file   Number of children: 3   Years of education: Not on file   Highest education level: Not on file  Occupational History   Occupation: CMA and  phlebotomist    Comment: not working but would like to  Tobacco Use   Smoking status: Former    Current packs/day: 1.00    Average packs/day: 1 pack/day for 13.0 years (13.0 ttl pk-yrs)    Types: Cigarettes   Smokeless tobacco: Never  Vaping Use   Vaping status: Never Used  Substance and Sexual Activity   Alcohol use: Not Currently   Drug use: No   Sexual activity: Not Currently    Birth control/protection: Post-menopausal  Other Topics Concern   Not on file  Social History Narrative   Independent at baseline.  Lives at home by herself   Social Determinants of Health   Financial Resource Strain: Not on file  Food Insecurity: Not on file  Transportation Needs: Not on file  Physical Activity: Sufficiently Active (01/24/2017)   Exercise Vital Sign    Days of Exercise per Week: 5 days    Minutes of Exercise per Session: 50 min  Stress: No  Stress Concern Present (01/24/2017)   [employer] of Occupational Health - Occupational Stress Questionnaire    Feeling of Stress : Not at all  Social Connections: Somewhat Isolated (01/24/2017)   Social Connection and Isolation Panel [NHANES]    Frequency of Communication with Friends and Family: More than three times a week    Frequency of Social Gatherings with Friends and Family: More than three times a week    Attends Religious Services: More than 4 times per year    Active Member of [employer] or Organizations: No    Attends Banker Meetings: Never    Marital Status: Divorced  Catering manager Violence: Not At Risk (01/24/2017)   Humiliation, Afraid, Rape, and Kick questionnaire    Fear of Current or Ex-Partner: No    Emotionally Abused: No    Physically Abused: No    Sexually Abused: No    Medications: Outpatient Medications Prior to Visit  Medication Sig   Olopatadine HCl (PATADAY) 0.2 % SOLN Apply to eye.   Oxycodone HCl 10 MG TABS Take 1 tablet (10 mg total) by mouth 2 (two) times daily as needed (severe pain).   Povidone, PF, (IVIZIA DRY EYES) 0.5 % SOLN Apply to eye.   VENTOLIN HFA 108 (90 Base) MCG/ACT inhaler Inhale 2 puffs into the lungs every 6 (six) hours as needed for wheezing or shortness of breath.    Vitamin D, Ergocalciferol, (DRISDOL) 1.25 MG (50000 UNIT) CAPS capsule TAKE 1 CAPSULE BY MOUTH ONE TIME PER WEEK   WIXELA INHUB 250-50 MCG/ACT AEPB Inhale 1 puff into the lungs in the morning and at bedtime.   No facility-administered medications prior to visit.    Allergies  Allergen Reactions   Ascorbic Acid Hives and Nausea And Vomiting    HIVES / VOMITING   Celecoxib Other (See Comments), Nausea And Vomiting and Shortness Of Breath    GI bleed  Other reaction(s): gi distress, Other (comments), Other (See Comments)  GI BLEED  GI bleeding  GI Bleeding  Other reaction(s): gi distress, Other (See Comments)  GI bleed  Other reaction(s): gi  distress, Other (comments), Other (See Comments)  GI BLEED            GI bleeding    GI BLEED    GI Bleeding      GI bleed  Other reaction(s): gi distress, Other (comments), Other (See Comments)  GI BLEED  GI BLEED  GI Bleeding  GI Bleeding    GI BLEED  GI bleed Other reaction(s): gi distress, Other (comments), Other (See Comments) GI BLEED      GI bleeding  GI BLEED  GI Bleeding   GI bleed Other reaction(s): gi distress, Other (comments), Other (See Comments) GI BLEED    Other reaction(s): gi distress, Other (See Comments) GI bleed Other reaction(s): gi distress, Other (comments), Other (See Comments) GI BLEED      GI bleeding  GI BLEED  GI Bleeding   GI bleed Other reaction(s): gi distress, Other (comments), Other (See Comments) GI BLEED GI BLEED GI Bleeding   Doxycycline Other (See Comments), Nausea And Vomiting and Shortness Of Breath    Unknown reaction.  Other reaction(s): Other (See Comments), Unknown, Unknown (comments)  Pt does not remember  Other reaction(s): Other (See Comments), Other (See Comments), unknown, Unknown (comments)  Unknown reaction.  Other reaction(s): Other (See Comments), Unknown, Unknown (comments)  Pt does not remember  Pt does not remember  Pt does not remember    Unknown reaction. Other reaction(s): Other (See Comments), Unknown, Unknown (comments) Pt does not remember    Other reaction(s): Other (See Comments), Other (See Comments), unknown, Unknown (comments) Unknown reaction. Other reaction(s): Other (See Comments), Unknown, Unknown (comments) Pt does not remember Pt does not remember   Gabapentin Nausea And Vomiting, Other (See Comments) and Shortness Of Breath    Drowsiness.  Like a zombie, not functional  Other reaction(s): Other (See Comments), Other (See Comments), Unknown, Unknown (comments)  Drowsiness.  Like a zombie, not functional  Drowsiness, lethargy  Drowsiness, lethargy    Drowsiness. Like a zombie, not  functional    Other reaction(s): Other (See Comments), Other (See Comments), Unknown, Unknown (comments) Drowsiness. Like a zombie, not functional Drowsiness, lethargy  Drowsiness, lethargy   Shellfish Allergy Anaphylaxis    Other reaction(s): anaphylaxis/angioedema  Betadine on skin is okay  Other reaction(s): anaphylaxis/angioedema Other reaction(s): anaphylaxis/angioedema  Betadine on skin is okay   Strawberry Extract Hives, Nausea And Vomiting, Nausea Only and Rash    Severe vomitting  Other Reaction(s): Skin Rashes, Hives   Tape Dermatitis    Patient stated Tegaderm after surgery as well as a clear tape tear off her top skin layer and leave her raw. Was told to notify team allergy to tape.    Tramadol Other (See Comments)    "blacked out", found wandering in the street  Other Reaction(s): Not available, Other  Other Reaction(s): neurological reaction    confusion    Altered mental status, confusion    CONFUSION    "blacked out", found wandering in the street  Other Reaction(s): neurological reaction, Not available, Other (See Comments)    "blacked out", found wandering in the street    Altered mental status, confusion    CONFUSION    confusion    Other Reaction(s): neurological reaction  confusion  Altered mental status, confusion  CONFUSION  "blacked out", found wandering in the street   Ibuprofen Other (See Comments)    "Blood disorder", GI bleed.  Other reaction(s): gi distress, Other (comments), Other (See Comments)  Other Reaction(s): GI bleeding, Not available, Other  GI Bleeding    GI BLEED    "Blood disorder", GI bleed. Other reaction(s): gi distress, Other (comments), Other (See Comments)  Other Reaction(s): Not available, Other (See Comments)    "Blood disorder", GI bleed. Other reaction(s): gi distress, Other (comments), Other (See Comments)    GI BLEED    GI Bleeding    GI Bleeding  GI BLEED  "Blood  disorder", GI bleed. Other  reaction(s): gi distress, Other (comments), Other (See Comments)   Montelukast Other (See Comments)    Syncope..passed out  Other Reaction(s): Other  Caused Fainting    Passed out    Syncope..passed out  Other Reaction(s): Not available, other/intolerance, Syncope    Syncope..passed out    Passed out    Caused Fainting    Caused Fainting  Passed out  Syncope..passed out   Naproxen Other (See Comments)    GI bleed  Other reaction(s): Other (comments), Other (See Comments)  Other Reaction(s): GI Bleed, GI bleeding, Not available, Other  GI Bleeding    GI BLEED    GI bleed Other reaction(s): Other (comments), Other (See Comments)  Other Reaction(s): Other (See Comments)    GI bleed Other reaction(s): Other (comments), Other (See Comments)    GI BLEED    GI Bleeding    GI Bleeding  GI BLEED  GI bleed Other reaction(s): Other (comments), Other (See Comments)   Prednisone Other (See Comments)    Tremors, severe  Other Reaction(s): Other (See Comments)  Other reaction(s): Other (See Comments), Other (See Comments)  Tremors, severe  Tremors and mental fogginess with oral prednisone, tolerates topical and inhaled  Tremors and mental fogginess with oral prednisone, tolerates topical and inhaled    Tremors, severe    Other reaction(s): Other (See Comments), Other (See Comments) Tremors, severe Tremors and mental fogginess with oral prednisone, tolerates topical and inhaled   Ascorbate Hives    HIVES / VOMITING   Methotrexate Derivatives Nausea And Vomiting   Sesame Oil     Other Reaction(s): Sesame Seed   Soy Allergy     Other Reaction(s): Soybean   Egg-Derived Products Other (See Comments) and Rash    rash  Other reaction(s): Other (See Comments)  rash  rash    Other reaction(s): Other (See Comments) rash   Latex Itching and Rash    Other reaction(s): mild rash/itching  Other Reaction(s): Not available, Skin Rashes, Hives  Other reaction(s):  rash/itching   Metronidazole Nausea Only    Fatigue  Pt states she gets extremely nauseas and weak.   Nsaids Nausea And Vomiting and Other (See Comments)    Gastric bleed  Other Reaction(s): GI Bleed, Other  Other reaction(s): Other (See Comments), other/intolerance  Gastric bleed  Gastric bleed  Gastric bleed   Strawberry Flavor Hives   Sulfamethoxazole-Trimethoprim Other (See Comments)    Other reaction(s): Other (See Comments), other/intolerance  MAKES PAIN WORSE  Increases pain  Other Reaction(s): Other (See Comments)  Pt does not remember  Increases pain    Pt does not remember    MAKES PAIN WORSE    Other reaction(s): Other (See Comments), other/intolerance MAKES PAIN WORSE  Increases pain Increases pain    Patient Care Team: Miki Kins, FNP as PCP - General (Family Medicine) Antonieta Iba, MD as PCP - Cardiology (Cardiology) Laurier Nancy, MD as Consulting Physician (Cardiology)  Review of Systems     Objective:    Vitals: BP 130/82   Pulse 78   Ht 5\' 1"  (1.549 m)   Wt 159 lb 9.6 oz (72.4 kg)   SpO2 96%   BMI 30.16 kg/m    Physical Exam   Most recent functional status assessment:    10/19/2022   11:10 AM  In your present state of health, do you have any difficulty performing the following activities:  Hearing? 0  Vision? 0  Difficulty concentrating or making decisions?  0  Walking or climbing stairs? 1  Dressing or bathing? 0  Doing errands, shopping? 0  Preparing Food and eating ? N  Using the Toilet? N  In the past six months, have you accidently leaked urine? N  Do you have problems with loss of bowel control? N  Managing your Medications? N  Managing your Finances? N  Housekeeping or managing your Housekeeping? N    Most recent fall risk assessment:    10/19/2022   11:12 AM  Fall Risk   Falls in the past year? 0  Number falls in past yr: 0  Injury with Fall? 0  Risk for fall due to : Impaired balance/gait   Follow up Falls prevention discussed;Education provided;Falls evaluation completed     Most recent depression screenings:    10/19/2022   11:13 AM  PHQ 2/9 Scores  PHQ - 2 Score 2  PHQ- 9 Score 4    Most recent cognitive screening:    10/19/2022   11:14 AM  6CIT Screen  What Year? 0 points  What month? 0 points  What time? 0 points  Count back from 20 0 points  Months in reverse 0 points  Repeat phrase 2 points  Total Score 2 points    No results found for any visits on 10/19/22.     Assessment & Plan:      Annual wellness visit done today including the all of the following: Reviewed patient's Family Medical History Reviewed and updated list of patient's medical providers Assessment of cognitive impairment was done Assessed patient's functional ability Established a written schedule for health screening services Health Risk Assessent Completed and Reviewed  Exercise Activities and Dietary recommendations  Goals      Absence of Fall and Fall-Related Injury     Evidence-based guidance:  Assess fall risk using a validated tool when available. Consider balance and gait impairment, muscle weakness, diminished vision or hearing, environmental hazards, presence of urinary or bowel urgency and/or incontinence.  Communicate fall injury risk to interprofessional healthcare team.  Develop a fall prevention plan with the patient and family.  Promote use of personal vision and auditory aids.  Promote reorientation, appropriate sensory stimulation, and routines to decrease risk of fall when changes in mental status are present.  Assess assistance level required for safe and effective self-care; consider referral for home care.  Encourage physical activity, such as performance of self-care at highest level of ability, strength and balance exercise program, and provision of appropriate assistive devices; refer to rehabilitation therapy.  Refer to community-based fall prevention  program where available.  If fall occurs, determine the cause and revise fall injury prevention plan.  Regularly review medication contribution to fall risk; consider risk related to polypharmacy and age.  Refer to pharmacist for consultation when concerns about medications are revealed.  Balance adequate pain management with potential for oversedation.  Provide guidance related to environmental modifications.  Consider supplementation with Vitamin D.   Notes:      Activity and Exercise Increased     Evidence-based guidance:  Review current exercise levels.  Assess patient perspective on exercise or activity level, barriers to increasing activity, motivation and readiness for change.  Recommend or set healthy exercise goal based on individual tolerance.  Encourage small steps toward making change in amount of exercise or activity.  Urge reduction of sedentary activities or screen time.  Promote group activities within the community or with family or support person.  Consider referral to rehabiliation therapist for  assessment and exercise/activity plan.   Notes:      Maintain Mobility and Function     Evidence-based guidance:  Acknowledge and validate impact of pain, loss of strength and potential disfigurement (hand osteoarthritis) on mental health and daily life, such as social isolation, anxiety, depression, impaired sexual relationship and   injury from falls.  Anticipate referral to physical or occupational therapy for assessment, therapeutic exercise and recommendation for adaptive equipment or assistive devices; encourage participation.  Assess impact on ability to perform activities of daily living, as well as engage in sports and leisure events or requirements of work or school.  Provide anticipatory guidance and reassurance about the benefit of exercise to maintain function; acknowledge and normalize fear that exercise may worsen symptoms.  Encourage regular exercise, at least 10  minutes at a time for 45 minutes per week; consider yoga, water exercise and proprioceptive exercises; encourage use of wearable activity tracker to increase motivation and adherence.  Encourage maintenance or resumption of daily activities, including employment, as pain allows and with minimal exposure to trauma.  Assist patient to advocate for adaptations to the work environment.  Consider level of pain and function, gender, age, lifestyle, patient preference, quality of life, readiness and ?ocapacity to benefit? when recommending patients for orthopaedic surgery consultation.  Explore strategies, such as changes to medication regimen or activity that enables patient to anticipate and manage flare-ups that increase deconditioning and disability.  Explore patient preferences; encourage exposure to a broader range of activities that have been avoided for fear of experiencing pain.  Identify barriers to participation in therapy or exercise, such as pain with activity, anticipated or imagined pain.  Monitor postoperative joint replacement or any preexisting joint replacement for ongoing pain and loss of function; provide social support and encouragement throughout recovery.   Notes:         Immunization History  Administered Date(s) Administered   Influenza Split 12/01/2013    Health Maintenance  Topic Date Due   COVID-19 Vaccine (1) Never done   Hepatitis C Screening  Never done   DTaP/Tdap/Td (1 - Tdap) Never done   Zoster Vaccines- Shingrix (1 of 2) Never done   Colonoscopy  Never done   MAMMOGRAM  09/18/2021   Cervical Cancer Screening (HPV/Pap Cotest)  01/24/2022   INFLUENZA VACCINE  08/05/2022   Medicare Annual Wellness (AWV)  10/19/2023   HIV Screening  Completed   HPV VACCINES  Aged Out     Discussed health benefits of physical activity, and encouraged her to engage in regular exercise appropriate for her age and condition.    Setting up referral to rheumatology for  patient, given her symptoms, specifically her ophthalmologist's concerns re: the inflammation in her eyes.        Miki Kins, FNP   10/19/2022  This document may have been prepared by Dragon Voice Recognition software and as such may include unintentional dictation errors.

## 2022-11-29 ENCOUNTER — Encounter: Payer: Self-pay | Admitting: Family

## 2022-11-29 ENCOUNTER — Ambulatory Visit: Payer: 59 | Admitting: Family

## 2022-11-29 VITALS — BP 121/78 | HR 86 | Ht 61.0 in | Wt 157.0 lb

## 2022-11-29 DIAGNOSIS — E041 Nontoxic single thyroid nodule: Secondary | ICD-10-CM

## 2022-11-29 DIAGNOSIS — R5383 Other fatigue: Secondary | ICD-10-CM

## 2022-11-29 DIAGNOSIS — E782 Mixed hyperlipidemia: Secondary | ICD-10-CM

## 2022-11-29 DIAGNOSIS — E538 Deficiency of other specified B group vitamins: Secondary | ICD-10-CM

## 2022-11-29 DIAGNOSIS — R635 Abnormal weight gain: Secondary | ICD-10-CM | POA: Diagnosis not present

## 2022-11-29 DIAGNOSIS — I10 Essential (primary) hypertension: Secondary | ICD-10-CM | POA: Diagnosis not present

## 2022-11-29 DIAGNOSIS — E559 Vitamin D deficiency, unspecified: Secondary | ICD-10-CM

## 2022-11-29 DIAGNOSIS — R7303 Prediabetes: Secondary | ICD-10-CM

## 2022-11-29 MED ORDER — DEXAMETHASONE 4 MG PO TABS: 8.0000 mg | ORAL_TABLET | Freq: Once | ORAL | 0 refills | Status: AC

## 2022-12-14 ENCOUNTER — Encounter: Payer: Self-pay | Admitting: Family

## 2022-12-14 MED ORDER — OXYCODONE HCL 10 MG PO TABS: 10.0000 mg | ORAL_TABLET | Freq: Two times a day (BID) | ORAL | 0 refills | Status: DC | PRN

## 2022-12-16 ENCOUNTER — Telehealth: Payer: Self-pay

## 2022-12-16 NOTE — Telephone Encounter (Signed)
Pt called and lvm requesting a call back- she stated that she is needed her pain medication sent in and also that she called [facility] and they still have not received the remaining documents for the referral-please advise-HQ

## 2022-12-31 LAB — CBC WITH DIFFERENTIAL/PLATELET
Basophils Absolute: 0 10*3/uL (ref 0.0–0.2)
Basos: 0 %
EOS (ABSOLUTE): 0 10*3/uL (ref 0.0–0.4)
Eos: 0 %
Hematocrit: 45.4 % (ref 34.0–46.6)
Hemoglobin: 14.7 g/dL (ref 11.1–15.9)
Immature Grans (Abs): 0.1 10*3/uL (ref 0.0–0.1)
Immature Granulocytes: 1 %
Lymphocytes Absolute: 0.6 10*3/uL — ABNORMAL LOW (ref 0.7–3.1)
Lymphs: 10 %
MCH: 30.2 pg (ref 26.6–33.0)
MCHC: 32.4 g/dL (ref 31.5–35.7)
MCV: 93 fL (ref 79–97)
Monocytes Absolute: 0 10*3/uL — ABNORMAL LOW (ref 0.1–0.9)
Monocytes: 0 %
Neutrophils Absolute: 6 10*3/uL (ref 1.4–7.0)
Neutrophils: 89 %
Platelets: 408 10*3/uL (ref 150–450)
RBC: 4.87 x10E6/uL (ref 3.77–5.28)
RDW: 12.1 % (ref 11.7–15.4)
WBC: 6.7 10*3/uL (ref 3.4–10.8)

## 2022-12-31 LAB — LIPID PANEL
Chol/HDL Ratio: 2.7 {ratio} (ref 0.0–4.4)
Cholesterol, Total: 288 mg/dL — ABNORMAL HIGH (ref 100–199)
HDL: 105 mg/dL (ref >39)
LDL Chol Calc (NIH): 172 mg/dL — ABNORMAL HIGH (ref 0–99)
Triglycerides: 72 mg/dL (ref 0–149)
VLDL Cholesterol Cal: 11 mg/dL (ref 5–40)

## 2022-12-31 LAB — CMP14+EGFR
ALT: 21 [IU]/L (ref 0–32)
AST: 28 [IU]/L (ref 0–40)
Albumin: 5.1 g/dL — ABNORMAL HIGH (ref 3.9–4.9)
Alkaline Phosphatase: 108 [IU]/L (ref 44–121)
BUN/Creatinine Ratio: 8 — ABNORMAL LOW (ref 12–28)
BUN: 6 mg/dL — ABNORMAL LOW (ref 8–27)
Bilirubin Total: 0.4 mg/dL (ref 0.0–1.2)
CO2: 22 mmol/L (ref 20–29)
Calcium: 10.3 mg/dL (ref 8.7–10.3)
Chloride: 102 mmol/L (ref 96–106)
Creatinine, Ser: 0.72 mg/dL (ref 0.57–1.00)
Globulin, Total: 3 g/dL (ref 1.5–4.5)
Glucose: 136 mg/dL — ABNORMAL HIGH (ref 70–99)
Potassium: 4.9 mmol/L (ref 3.5–5.2)
Sodium: 141 mmol/L (ref 134–144)
Total Protein: 8.1 g/dL (ref 6.0–8.5)
eGFR: 93 mL/min/{1.73_m2} (ref >59)

## 2022-12-31 LAB — IRON,TIBC AND FERRITIN PANEL
Ferritin: 58 ng/mL (ref 15–150)
Iron Saturation: 19 % (ref 15–55)
Iron: 77 ug/dL (ref 27–139)
Total Iron Binding Capacity: 410 ug/dL (ref 250–450)
UIBC: 333 ug/dL (ref 118–369)

## 2022-12-31 LAB — VITAMIN B12: Vitamin B-12: 392 pg/mL (ref 232–1245)

## 2022-12-31 LAB — HEMOGLOBIN A1C
Est. average glucose Bld gHb Est-mCnc: 108 mg/dL
Hgb A1c MFr Bld: 5.4 % (ref 4.8–5.6)

## 2022-12-31 LAB — CORTISOL DEXAMETHASONE REFLEX: Cortisol, Serum LCMS: <1 ug/dL

## 2022-12-31 LAB — TSH: TSH: 0.393 u[IU]/mL — ABNORMAL LOW (ref 0.450–4.500)

## 2022-12-31 LAB — VITAMIN D 25 HYDROXY (VIT D DEFICIENCY, FRACTURES): Vit D, 25-Hydroxy: 27.9 ng/mL — ABNORMAL LOW (ref 30.0–100.0)

## 2023-01-01 ENCOUNTER — Other Ambulatory Visit: Payer: Self-pay | Admitting: Internal Medicine

## 2023-01-01 DIAGNOSIS — E782 Mixed hyperlipidemia: Secondary | ICD-10-CM

## 2023-01-01 MED ORDER — ROSUVASTATIN CALCIUM 20 MG PO TABS: 20.0000 mg | ORAL_TABLET | Freq: Every day | ORAL | 1 refills | Status: DC

## 2023-01-19 ENCOUNTER — Ambulatory Visit: Payer: 59 | Admitting: Family

## 2023-01-19 VITALS — BP 128/82 | HR 76 | Ht 61.0 in | Wt 159.8 lb

## 2023-01-19 DIAGNOSIS — R7989 Other specified abnormal findings of blood chemistry: Secondary | ICD-10-CM

## 2023-01-19 DIAGNOSIS — G894 Chronic pain syndrome: Secondary | ICD-10-CM | POA: Diagnosis not present

## 2023-01-19 DIAGNOSIS — M1990 Unspecified osteoarthritis, unspecified site: Secondary | ICD-10-CM | POA: Diagnosis not present

## 2023-01-19 MED ORDER — PHENTERMINE HCL 37.5 MG PO TABS: 37.5000 mg | ORAL_TABLET | Freq: Every day | ORAL | 0 refills | Status: DC

## 2023-01-19 MED ORDER — OXYCODONE HCL 10 MG PO TABS: 10.0000 mg | ORAL_TABLET | Freq: Two times a day (BID) | ORAL | 0 refills | Status: DC | PRN

## 2023-01-19 NOTE — Progress Notes (Signed)
 Established Patient Office Visit  Subjective:  Patient ID: Carmen Grimes, female    DOB: [birth year]  Age: 63 y.o. MRN: [id number]  Chief Complaint  Patient presents with   Follow-up    3 mo follow up    Patient is here today for her 3 months follow up.  She has been feeling poorly since last appointment.   She does have additional concerns to discuss today.  She continues to have severe joint pain, all the way down to her toes.   Labs are due today. She needs refills.   I have reviewed her active problem list, medication list, allergies, notes from last encounter, lab results for her appointment today.      No other concerns at this time.   Past Medical History:  Diagnosis Date   Anxiety state 10/18/2017   Asthma    controlled   Dyspnea 12/2017   difficulty getting her breath at all times   Esophageal reflux 06/26/2017   Formatting of this note might be different from the original.  06/23/18: UGI series - Mild GERD.   Former cigarette smoker 03/09/2018   GERD (gastroesophageal reflux disease)    H. pylori infection 06/19/2018   Formatting of this note might be different from the original.  06/18/08 - H pylori breath test +. S/p amoxicillin + clarithromycin + omeprazole BID x 10 days. Omeprazole BID continued x 2 months for empiric treatment of possible gastritis.   H/O chest tube placement    H/O: GI bleed    Hiatal hernia    History of bronchoscopy 03/30/2018   History of thoracic surgery 03/30/2018   Hypertension    Lung mass    Mitral valve prolapse    Osteoarthritis    knees   Other nonspecific abnormal finding of lung field 11/18/2017   Other pneumothorax 12/12/2017   Stomach ulcer     Past Surgical History:  Procedure Laterality Date   ABDOMINAL HYSTERECTOMY  2002   ANTERIOR FUSION CERVICAL SPINE  2009   BREAST CYST ASPIRATION Right    COLONOSCOPY  2013   normal   ELECTROMAGNETIC NAVIGATION BROCHOSCOPY Left 12/12/2017   Procedure: ELECTROMAGNETIC  NAVIGATION BRONCHOSCOPY;  Surgeon: Erin Fulling, MD;  Location: [facility];  Service: Cardiopulmonary;  Laterality: Left;   ESOPHAGOGASTRODUODENOSCOPY  1990   GI bleed   FLEXIBLE BRONCHOSCOPY Left 12/12/2017   Procedure: FLEXIBLE BRONCHOSCOPY;  Surgeon: Erin Fulling, MD;  Location: [facility];  Service: Cardiopulmonary;  Laterality: Left;   KNEE ARTHROSCOPY Right 2010   x3   KNEE SURGERY Left    Arthroscopy x1   LAPAROSCOPIC SUPRACERVICAL HYSTERECTOMY  2002   with BSO for fibroids/ AUB/ pelvic pain   LAPAROSCOPIC TUBAL LIGATION  1982   LUNG BIOPSY     SHOULDER SURGERY Right    rotator cuff repair   SHOULDER SURGERY Right 2011   second rotator cuff repair   tubal reanastamosis  1985    Social History   Socioeconomic History   Marital status: Single    Spouse name: Not on file   Number of children: 3   Years of education: Not on file   Highest education level: Not on file  Occupational History   Occupation: CMA and phlebotomist    Comment: not working but would like to  Tobacco Use   Smoking status: Former    Current packs/day: 1.00    Average packs/day: 1 pack/day for 13.0 years (13.0 ttl pk-yrs)    Types: Cigarettes   Smokeless tobacco:  Never  Vaping Use   Vaping status: Never Used  Substance and Sexual Activity   Alcohol use: Not Currently   Drug use: No   Sexual activity: Not Currently    Birth control/protection: Post-menopausal  Other Topics Concern   Not on file  Social History Narrative   Independent at baseline.  Lives at home by herself   Social Drivers of Health   Financial Resource Strain: Not on file  Food Insecurity: Not on file  Transportation Needs: Not on file  Physical Activity: Sufficiently Active (01/24/2017)   Exercise Vital Sign    Days of Exercise per Week: 5 days    Minutes of Exercise per Session: 50 min  Stress: No Stress Concern Present (01/24/2017)   [employer] of Occupational Health - Occupational Stress Questionnaire    Feeling  of Stress : Not at all  Social Connections: Somewhat Isolated (01/24/2017)   Social Connection and Isolation Panel [NHANES]    Frequency of Communication with Friends and Family: More than three times a week    Frequency of Social Gatherings with Friends and Family: More than three times a week    Attends Religious Services: More than 4 times per year    Active Member of [employer] or Organizations: No    Attends Banker Meetings: Never    Marital Status: Divorced  Catering manager Violence: Not At Risk (01/24/2017)   Humiliation, Afraid, Rape, and Kick questionnaire    Fear of Current or Ex-Partner: No    Emotionally Abused: No    Physically Abused: No    Sexually Abused: No    Family History  Problem Relation Age of Onset   Diabetes Mother    Dementia Mother        Alzheimers   Hypertension Mother    Glaucoma Mother    Prostate cancer Father 72   Stroke Father    Arthritis Father    Osteoporosis Father    Prostate cancer Brother    Breast cancer Sister 95   Uterine cancer Paternal Aunt        1 of 4 paternal aunts   Ovarian cancer Maternal Aunt        1 of 5 maternal aunts    Allergies  Allergen Reactions   Ascorbic Acid Hives and Nausea And Vomiting    HIVES / VOMITING   Celecoxib Other (See Comments), Nausea And Vomiting and Shortness Of Breath    GI bleed  Other reaction(s): gi distress, Other (comments), Other (See Comments)  GI BLEED  GI bleeding  GI Bleeding  Other reaction(s): gi distress, Other (See Comments)  GI bleed  Other reaction(s): gi distress, Other (comments), Other (See Comments)  GI BLEED            GI bleeding    GI BLEED    GI Bleeding      GI bleed  Other reaction(s): gi distress, Other (comments), Other (See Comments)  GI BLEED  GI BLEED  GI Bleeding  GI Bleeding    GI BLEED    GI bleed Other reaction(s): gi distress, Other (comments), Other (See Comments) GI BLEED      GI bleeding  GI BLEED  GI Bleeding   GI  bleed Other reaction(s): gi distress, Other (comments), Other (See Comments) GI BLEED    Other reaction(s): gi distress, Other (See Comments) GI bleed Other reaction(s): gi distress, Other (comments), Other (See Comments) GI BLEED      GI bleeding  GI BLEED  GI Bleeding   GI bleed Other reaction(s): gi distress, Other (comments), Other (See Comments) GI BLEED GI BLEED GI Bleeding   Doxycycline Other (See Comments), Nausea And Vomiting and Shortness Of Breath    Unknown reaction.  Other reaction(s): Other (See Comments), Unknown, Unknown (comments)  Pt does not remember  Other reaction(s): Other (See Comments), Other (See Comments), unknown, Unknown (comments)  Unknown reaction.  Other reaction(s): Other (See Comments), Unknown, Unknown (comments)  Pt does not remember  Pt does not remember  Pt does not remember    Unknown reaction. Other reaction(s): Other (See Comments), Unknown, Unknown (comments) Pt does not remember    Other reaction(s): Other (See Comments), Other (See Comments), unknown, Unknown (comments) Unknown reaction. Other reaction(s): Other (See Comments), Unknown, Unknown (comments) Pt does not remember Pt does not remember   Gabapentin Nausea And Vomiting, Other (See Comments) and Shortness Of Breath    Drowsiness.  Like a zombie, not functional  Other reaction(s): Other (See Comments), Other (See Comments), Unknown, Unknown (comments)  Drowsiness.  Like a zombie, not functional  Drowsiness, lethargy  Drowsiness, lethargy    Drowsiness. Like a zombie, not functional    Other reaction(s): Other (See Comments), Other (See Comments), Unknown, Unknown (comments) Drowsiness. Like a zombie, not functional Drowsiness, lethargy  Drowsiness, lethargy   Shellfish Allergy Anaphylaxis    Other reaction(s): anaphylaxis/angioedema  Betadine on skin is okay  Other reaction(s): anaphylaxis/angioedema Other reaction(s): anaphylaxis/angioedema  Betadine on skin is okay    Strawberry Extract Hives, Nausea And Vomiting, Nausea Only and Rash    Severe vomitting  Other Reaction(s): Skin Rashes, Hives   Tape Dermatitis    Patient stated Tegaderm after surgery as well as a clear tape tear off her top skin layer and leave her raw. Was told to notify team allergy to tape.    Tramadol Other (See Comments)    "blacked out", found wandering in the street  Other Reaction(s): Not available, Other  Other Reaction(s): neurological reaction    confusion    Altered mental status, confusion    CONFUSION    "blacked out", found wandering in the street  Other Reaction(s): neurological reaction, Not available, Other (See Comments)    "blacked out", found wandering in the street    Altered mental status, confusion    CONFUSION    confusion    Other Reaction(s): neurological reaction  confusion  Altered mental status, confusion  CONFUSION  "blacked out", found wandering in the street   Ibuprofen Other (See Comments)    "Blood disorder", GI bleed.  Other reaction(s): gi distress, Other (comments), Other (See Comments)  Other Reaction(s): GI bleeding, Not available, Other  GI Bleeding    GI BLEED    "Blood disorder", GI bleed. Other reaction(s): gi distress, Other (comments), Other (See Comments)  Other Reaction(s): Not available, Other (See Comments)    "Blood disorder", GI bleed. Other reaction(s): gi distress, Other (comments), Other (See Comments)    GI BLEED    GI Bleeding    GI Bleeding  GI BLEED  "Blood disorder", GI bleed. Other reaction(s): gi distress, Other (comments), Other (See Comments)   Montelukast Other (See Comments)    Syncope..passed out  Other Reaction(s): Other  Caused Fainting    Passed out    Syncope..passed out  Other Reaction(s): Not available, other/intolerance, Syncope    Syncope..passed out    Passed out    Caused Fainting    Caused Fainting  Passed out  Syncope..passed out  Naproxen Other (See Comments)     GI bleed  Other reaction(s): Other (comments), Other (See Comments)  Other Reaction(s): GI Bleed, GI bleeding, Not available, Other  GI Bleeding    GI BLEED    GI bleed Other reaction(s): Other (comments), Other (See Comments)  Other Reaction(s): Other (See Comments)    GI bleed Other reaction(s): Other (comments), Other (See Comments)    GI BLEED    GI Bleeding    GI Bleeding  GI BLEED  GI bleed Other reaction(s): Other (comments), Other (See Comments)   Prednisone Other (See Comments)    Tremors, severe  Other Reaction(s): Other (See Comments)  Other reaction(s): Other (See Comments), Other (See Comments)  Tremors, severe  Tremors and mental fogginess with oral prednisone, tolerates topical and inhaled  Tremors and mental fogginess with oral prednisone, tolerates topical and inhaled    Tremors, severe    Other reaction(s): Other (See Comments), Other (See Comments) Tremors, severe Tremors and mental fogginess with oral prednisone, tolerates topical and inhaled   Ascorbate Hives    HIVES / VOMITING   Methotrexate Derivatives Nausea And Vomiting   Sesame Oil     Other Reaction(s): Sesame Seed   Soy Allergy (Obsolete)     Other Reaction(s): Soybean   Egg-Derived Products Other (See Comments) and Rash    rash  Other reaction(s): Other (See Comments)  rash  rash    Other reaction(s): Other (See Comments) rash   Latex Itching and Rash    Other reaction(s): mild rash/itching  Other Reaction(s): Not available, Skin Rashes, Hives  Other reaction(s): rash/itching   Metronidazole Nausea Only    Fatigue  Pt states she gets extremely nauseas and weak.   Nsaids Nausea And Vomiting and Other (See Comments)    Gastric bleed  Other Reaction(s): GI Bleed, Other  Other reaction(s): Other (See Comments), other/intolerance  Gastric bleed  Gastric bleed  Gastric bleed   Strawberry Flavoring Agent (Non-Screening) Hives   Sulfamethoxazole-Trimethoprim Other (See  Comments)    Other reaction(s): Other (See Comments), other/intolerance  MAKES PAIN WORSE  Increases pain  Other Reaction(s): Other (See Comments)  Pt does not remember  Increases pain    Pt does not remember    MAKES PAIN WORSE    Other reaction(s): Other (See Comments), other/intolerance MAKES PAIN WORSE  Increases pain Increases pain    Review of Systems  Musculoskeletal:  Positive for joint pain (generalized) and myalgias (generalized).  All other systems reviewed and are negative.      Objective:   BP 128/82   Pulse 76   Ht 5\' 1"  (1.549 m)   Wt 159 lb 12.8 oz (72.5 kg)   SpO2 98%   BMI 30.19 kg/m   Vitals:   01/19/23 1039  BP: 128/82  Pulse: 76  Height: 5\' 1"  (1.549 m)  Weight: 159 lb 12.8 oz (72.5 kg)  SpO2: 98%  BMI (Calculated): 30.21    Physical Exam Vitals and nursing note reviewed.  Constitutional:      General: She is awake.     Appearance: Normal appearance. She is well-developed and normal weight.  HENT:     Head: Normocephalic.  Eyes:     Extraocular Movements: Extraocular movements intact.     Conjunctiva/sclera: Conjunctivae normal.     Pupils: Pupils are equal, round, and reactive to light.  Cardiovascular:     Rate and Rhythm: Normal rate.  Pulmonary:     Effort: Pulmonary effort is normal.  Neurological:  General: No focal deficit present.     Mental Status: She is alert and oriented to person, place, and time. Mental status is at baseline.  Psychiatric:        Mood and Affect: Mood normal.        Behavior: Behavior normal. Behavior is cooperative.        Thought Content: Thought content normal.        Judgment: Judgment normal.      Results for orders placed or performed in visit on 01/19/23  TSH+T4F+T3Free  Result Value Ref Range   TSH 1.120 0.450 - 4.500 uIU/mL   T3, Free 2.8 2.0 - 4.4 pg/mL   Free T4 0.81 (L) 0.82 - 1.77 ng/dL  Rheumatoid Arthritis Profile  Result Value Ref Range   Rheumatoid fact SerPl-aCnc  10.4 <14.0 IU/mL   Cyclic Citrullin Peptide Ab 4 0 - 19 units    Recent Results (from the past 2160 hours)  TSH+T4F+T3Free     Status: Abnormal   Collection Time: 03/03/23 12:18 PM  Result Value Ref Range   TSH 1.120 0.450 - 4.500 uIU/mL   T3, Free 2.8 2.0 - 4.4 pg/mL   Free T4 0.81 (L) 0.82 - 1.77 ng/dL  Rheumatoid Arthritis Profile     Status: None   Collection Time: 03/03/23 12:18 PM  Result Value Ref Range   Rheumatoid fact SerPl-aCnc 10.4 <14.0 IU/mL   Cyclic Citrullin Peptide Ab 4 0 - 19 units    Comment:                           Negative               <20                           Weak positive      20 - 39                           Moderate positive  40 - 59                           Strong positive        >59   POCT Urinalysis Dipstick (81191)     Status: None   Collection Time: 03/15/23  2:17 PM  Result Value Ref Range   Color, UA yellow    Clarity, UA clear    Glucose, UA Negative Negative   Bilirubin, UA negative    Ketones, UA trace    Spec Grav, UA 1.025 1.010 - 1.025   Blood, UA negative    pH, UA 5.5 5.0 - 8.0   Protein, UA Negative Negative   Urobilinogen, UA 0.2 0.2 or 1.0 E.U./dL   Nitrite, UA negative    Leukocytes, UA Negative Negative   Appearance clear    Odor no        Assessment & Plan:   Problem List Items Addressed This Visit       Other   Chronic pain syndrome   Sending refill for patient for her medications.  She is using appropriately and is not asking for additional fills.  Will reassess at follow up.      Other Visit Diagnoses       Generalized arthritis    -  Primary   Checking RA profile to  ensure that her aches/pains are not 2/2 to RA instead of typical OA. Will call with results when available.   Relevant Orders   Rheumatoid Arthritis Profile (Completed)     Abnormal TSH       Checking TSH along with T3 and T4.  Will call with results when available.   Relevant Orders   TSH+T4F+T3Free (Completed)       Return  in about 2 months (around 03/19/2023).   Total time spent: 20 minutes  Miki Kins, FNP  01/19/2023   This document may have been prepared by [facility] Voice Recognition software and as such may include unintentional dictation errors.

## 2023-02-06 ENCOUNTER — Encounter: Payer: Self-pay | Admitting: Family

## 2023-02-06 NOTE — Assessment & Plan Note (Signed)
 Patient stable.  Well controlled with current therapy.   Continue current meds.

## 2023-02-06 NOTE — Progress Notes (Signed)
Established Patient Office Visit  Subjective:  Patient ID: Carmen Grimes, female    DOB: 03-Aug-1960  Age: 63 y.o. MRN: [id number]  Chief Complaint  Patient presents with   Follow-up    Patient is here today for her 1 month follow up.  She has been feeling fairly well since last appointment.   She does not have additional concerns to discuss today.  Labs are due today. She needs refills.   I have reviewed her active problem list, medication list, allergies, notes from last encounter, lab results for her appointment today.      No other concerns at this time.   Past Medical History:  Diagnosis Date   Anxiety state 10/18/2017   Asthma    controlled   Dyspnea 12/2017   difficulty getting her breath at all times   Esophageal reflux 06/26/2017   Formatting of this note might be different from the original.  06/23/18: UGI series - Mild GERD.   Former cigarette smoker 03/09/2018   GERD (gastroesophageal reflux disease)    H. pylori infection 06/19/2018   Formatting of this note might be different from the original.  06/18/08 - H pylori breath test +. S/p amoxicillin + clarithromycin + omeprazole BID x 10 days. Omeprazole BID continued x 2 months for empiric treatment of possible gastritis.   H/O chest tube placement    H/O: GI bleed    Hiatal hernia    History of bronchoscopy 03/30/2018   History of thoracic surgery 03/30/2018   Hypertension    Lung mass    Mitral valve prolapse    Osteoarthritis    knees   Other nonspecific abnormal finding of lung field 11/18/2017   Other pneumothorax 12/12/2017   Stomach ulcer     Past Surgical History:  Procedure Laterality Date   ABDOMINAL HYSTERECTOMY  2002   ANTERIOR FUSION CERVICAL SPINE  2009   BREAST CYST ASPIRATION Right    COLONOSCOPY  2013   normal   ELECTROMAGNETIC NAVIGATION BROCHOSCOPY Left 12/12/2017   Procedure: ELECTROMAGNETIC NAVIGATION BRONCHOSCOPY;  Surgeon: Erin Fulling, MD;  Location: [facility];  Service:  Cardiopulmonary;  Laterality: Left;   ESOPHAGOGASTRODUODENOSCOPY  1990   GI bleed   FLEXIBLE BRONCHOSCOPY Left 12/12/2017   Procedure: FLEXIBLE BRONCHOSCOPY;  Surgeon: Erin Fulling, MD;  Location: [facility];  Service: Cardiopulmonary;  Laterality: Left;   KNEE ARTHROSCOPY Right 2010   x3   KNEE SURGERY Left    Arthroscopy x1   LAPAROSCOPIC SUPRACERVICAL HYSTERECTOMY  2002   with BSO for fibroids/ AUB/ pelvic pain   LAPAROSCOPIC TUBAL LIGATION  1982   LUNG BIOPSY     SHOULDER SURGERY Right    rotator cuff repair   SHOULDER SURGERY Right 2011   second rotator cuff repair   tubal reanastamosis  1985    Social History   Socioeconomic History   Marital status: Single    Spouse name: Not on file   Number of children: 3   Years of education: Not on file   Highest education level: Not on file  Occupational History   Occupation: CMA and phlebotomist    Comment: not working but would like to  Tobacco Use   Smoking status: Former    Current packs/day: 1.00    Average packs/day: 1 pack/day for 13.0 years (13.0 ttl pk-yrs)    Types: Cigarettes   Smokeless tobacco: Never  Vaping Use   Vaping status: Never Used  Substance and Sexual Activity   Alcohol use: Not Currently  Drug use: No   Sexual activity: Not Currently    Birth control/protection: Post-menopausal  Other Topics Concern   Not on file  Social History Narrative   Independent at baseline.  Lives at home by herself   Social Drivers of Health   Financial Resource Strain: Not on file  Food Insecurity: Not on file  Transportation Needs: Not on file  Physical Activity: Sufficiently Active (01/24/2017)   Exercise Vital Sign    Days of Exercise per Week: 5 days    Minutes of Exercise per Session: 50 min  Stress: No Stress Concern Present (01/24/2017)   [employer] of Occupational Health - Occupational Stress Questionnaire    Feeling of Stress : Not at all  Social Connections: Somewhat Isolated (01/24/2017)   Social  Connection and Isolation Panel [NHANES]    Frequency of Communication with Friends and Family: More than three times a week    Frequency of Social Gatherings with Friends and Family: More than three times a week    Attends Religious Services: More than 4 times per year    Active Member of [employer] or Organizations: No    Attends Banker Meetings: Never    Marital Status: Divorced  Catering manager Violence: Not At Risk (01/24/2017)   Humiliation, Afraid, Rape, and Kick questionnaire    Fear of Current or Ex-Partner: No    Emotionally Abused: No    Physically Abused: No    Sexually Abused: No    Family History  Problem Relation Age of Onset   Diabetes Mother    Dementia Mother        Alzheimers   Hypertension Mother    Glaucoma Mother    Prostate cancer Father 51   Stroke Father    Arthritis Father    Osteoporosis Father    Prostate cancer Brother    Breast cancer Sister 68   Uterine cancer Paternal Aunt        1 of 4 paternal aunts   Ovarian cancer Maternal Aunt        1 of 5 maternal aunts    Allergies  Allergen Reactions   Ascorbic Acid Hives and Nausea And Vomiting    HIVES / VOMITING   Celecoxib Other (See Comments), Nausea And Vomiting and Shortness Of Breath    GI bleed  Other reaction(s): gi distress, Other (comments), Other (See Comments)  GI BLEED  GI bleeding  GI Bleeding  Other reaction(s): gi distress, Other (See Comments)  GI bleed  Other reaction(s): gi distress, Other (comments), Other (See Comments)  GI BLEED            GI bleeding    GI BLEED    GI Bleeding      GI bleed  Other reaction(s): gi distress, Other (comments), Other (See Comments)  GI BLEED  GI BLEED  GI Bleeding  GI Bleeding    GI BLEED    GI bleed Other reaction(s): gi distress, Other (comments), Other (See Comments) GI BLEED      GI bleeding  GI BLEED  GI Bleeding   GI bleed Other reaction(s): gi distress, Other (comments), Other (See Comments) GI  BLEED    Other reaction(s): gi distress, Other (See Comments) GI bleed Other reaction(s): gi distress, Other (comments), Other (See Comments) GI BLEED      GI bleeding  GI BLEED  GI Bleeding   GI bleed Other reaction(s): gi distress, Other (comments), Other (See Comments) GI BLEED GI BLEED GI Bleeding  Doxycycline Other (See Comments), Nausea And Vomiting and Shortness Of Breath    Unknown reaction.  Other reaction(s): Other (See Comments), Unknown, Unknown (comments)  Pt does not remember  Other reaction(s): Other (See Comments), Other (See Comments), unknown, Unknown (comments)  Unknown reaction.  Other reaction(s): Other (See Comments), Unknown, Unknown (comments)  Pt does not remember  Pt does not remember  Pt does not remember    Unknown reaction. Other reaction(s): Other (See Comments), Unknown, Unknown (comments) Pt does not remember    Other reaction(s): Other (See Comments), Other (See Comments), unknown, Unknown (comments) Unknown reaction. Other reaction(s): Other (See Comments), Unknown, Unknown (comments) Pt does not remember Pt does not remember   Gabapentin Nausea And Vomiting, Other (See Comments) and Shortness Of Breath    Drowsiness.  Like a zombie, not functional  Other reaction(s): Other (See Comments), Other (See Comments), Unknown, Unknown (comments)  Drowsiness.  Like a zombie, not functional  Drowsiness, lethargy  Drowsiness, lethargy    Drowsiness. Like a zombie, not functional    Other reaction(s): Other (See Comments), Other (See Comments), Unknown, Unknown (comments) Drowsiness. Like a zombie, not functional Drowsiness, lethargy  Drowsiness, lethargy   Shellfish Allergy Anaphylaxis    Other reaction(s): anaphylaxis/angioedema  Betadine on skin is okay  Other reaction(s): anaphylaxis/angioedema Other reaction(s): anaphylaxis/angioedema  Betadine on skin is okay   Strawberry Extract Hives, Nausea And Vomiting, Nausea Only and Rash    Severe  vomitting  Other Reaction(s): Skin Rashes, Hives   Tape Dermatitis    Patient stated Tegaderm after surgery as well as a clear tape tear off her top skin layer and leave her raw. Was told to notify team allergy to tape.    Tramadol Other (See Comments)    "blacked out", found wandering in the street  Other Reaction(s): Not available, Other  Other Reaction(s): neurological reaction    confusion    Altered mental status, confusion    CONFUSION    "blacked out", found wandering in the street  Other Reaction(s): neurological reaction, Not available, Other (See Comments)    "blacked out", found wandering in the street    Altered mental status, confusion    CONFUSION    confusion    Other Reaction(s): neurological reaction  confusion  Altered mental status, confusion  CONFUSION  "blacked out", found wandering in the street   Ibuprofen Other (See Comments)    "Blood disorder", GI bleed.  Other reaction(s): gi distress, Other (comments), Other (See Comments)  Other Reaction(s): GI bleeding, Not available, Other  GI Bleeding    GI BLEED    "Blood disorder", GI bleed. Other reaction(s): gi distress, Other (comments), Other (See Comments)  Other Reaction(s): Not available, Other (See Comments)    "Blood disorder", GI bleed. Other reaction(s): gi distress, Other (comments), Other (See Comments)    GI BLEED    GI Bleeding    GI Bleeding  GI BLEED  "Blood disorder", GI bleed. Other reaction(s): gi distress, Other (comments), Other (See Comments)   Montelukast Other (See Comments)    Syncope..passed out  Other Reaction(s): Other  Caused Fainting    Passed out    Syncope..passed out  Other Reaction(s): Not available, other/intolerance, Syncope    Syncope..passed out    Passed out    Caused Fainting    Caused Fainting  Passed out  Syncope..passed out   Naproxen Other (See Comments)    GI bleed  Other reaction(s): Other (comments), Other (See  Comments)  Other Reaction(s): GI Bleed,  GI bleeding, Not available, Other  GI Bleeding    GI BLEED    GI bleed Other reaction(s): Other (comments), Other (See Comments)  Other Reaction(s): Other (See Comments)    GI bleed Other reaction(s): Other (comments), Other (See Comments)    GI BLEED    GI Bleeding    GI Bleeding  GI BLEED  GI bleed Other reaction(s): Other (comments), Other (See Comments)   Prednisone Other (See Comments)    Tremors, severe  Other Reaction(s): Other (See Comments)  Other reaction(s): Other (See Comments), Other (See Comments)  Tremors, severe  Tremors and mental fogginess with oral prednisone, tolerates topical and inhaled  Tremors and mental fogginess with oral prednisone, tolerates topical and inhaled    Tremors, severe    Other reaction(s): Other (See Comments), Other (See Comments) Tremors, severe Tremors and mental fogginess with oral prednisone, tolerates topical and inhaled   Ascorbate Hives    HIVES / VOMITING   Methotrexate Derivatives Nausea And Vomiting   Sesame Oil     Other Reaction(s): Sesame Seed   Soy Allergy (Do Not Select)     Other Reaction(s): Soybean   Egg-Derived Products Other (See Comments) and Rash    rash  Other reaction(s): Other (See Comments)  rash  rash    Other reaction(s): Other (See Comments) rash   Latex Itching and Rash    Other reaction(s): mild rash/itching  Other Reaction(s): Not available, Skin Rashes, Hives  Other reaction(s): rash/itching   Metronidazole Nausea Only    Fatigue  Pt states she gets extremely nauseas and weak.   Nsaids Nausea And Vomiting and Other (See Comments)    Gastric bleed  Other Reaction(s): GI Bleed, Other  Other reaction(s): Other (See Comments), other/intolerance  Gastric bleed  Gastric bleed  Gastric bleed   Strawberry Flavoring Agent (Non-Screening) Hives   Sulfamethoxazole-Trimethoprim Other (See Comments)    Other reaction(s): Other (See Comments),  other/intolerance  MAKES PAIN WORSE  Increases pain  Other Reaction(s): Other (See Comments)  Pt does not remember  Increases pain    Pt does not remember    MAKES PAIN WORSE    Other reaction(s): Other (See Comments), other/intolerance MAKES PAIN WORSE  Increases pain Increases pain    Review of Systems  All other systems reviewed and are negative.      Objective:   BP 121/78   Pulse 86   Ht 5\' 1"  (1.549 m)   Wt 157 lb (71.2 kg)   SpO2 99%   BMI 29.66 kg/m   Vitals:   11/29/22 1256  BP: 121/78  Pulse: 86  Height: 5\' 1"  (1.549 m)  Weight: 157 lb (71.2 kg)  SpO2: 99%  BMI (Calculated): 29.68    Physical Exam Vitals and nursing note reviewed.  Constitutional:      Appearance: Normal appearance. She is normal weight.  HENT:     Head: Normocephalic.  Eyes:     Extraocular Movements: Extraocular movements intact.     Conjunctiva/sclera: Conjunctivae normal.     Pupils: Pupils are equal, round, and reactive to light.  Cardiovascular:     Rate and Rhythm: Normal rate.  Pulmonary:     Effort: Pulmonary effort is normal.  Neurological:     General: No focal deficit present.     Mental Status: She is alert and oriented to person, place, and time. Mental status is at baseline.  Psychiatric:        Mood and Affect: Mood normal.  Behavior: Behavior normal.        Thought Content: Thought content normal.        Judgment: Judgment normal.      Results for orders placed or performed in visit on 11/29/22  Cortisol Dexamethasone Reflex  Result Value Ref Range   Cortisol, Serum LCMS <1.0 ug/dL   Dexamethasone Reflex Comment   Lipid panel  Result Value Ref Range   Cholesterol, Total 288 (H) 100 - 199 mg/dL   Triglycerides 72 0 - 149 mg/dL   HDL 657 >84 mg/dL   VLDL Cholesterol Cal 11 5 - 40 mg/dL   LDL Chol Calc (NIH) 696 (H) 0 - 99 mg/dL   Chol/HDL Ratio 2.7 0.0 - 4.4 ratio  VITAMIN D 25 Hydroxy (Vit-D Deficiency, Fractures)  Result Value Ref  Range   Vit D, 25-Hydroxy 27.9 (L) 30.0 - 100.0 ng/mL  CMP14+EGFR  Result Value Ref Range   Glucose 136 (H) 70 - 99 mg/dL   BUN 6 (L) 8 - 27 mg/dL   Creatinine, Ser 2.95 0.57 - 1.00 mg/dL   eGFR 93 >28 UX/LKG/4.01   BUN/Creatinine Ratio 8 (L) 12 - 28   Sodium 141 134 - 144 mmol/L   Potassium 4.9 3.5 - 5.2 mmol/L   Chloride 102 96 - 106 mmol/L   CO2 22 20 - 29 mmol/L   Calcium 10.3 8.7 - 10.3 mg/dL   Total Protein 8.1 6.0 - 8.5 g/dL   Albumin 5.1 (H) 3.9 - 4.9 g/dL   Globulin, Total 3.0 1.5 - 4.5 g/dL   Bilirubin Total 0.4 0.0 - 1.2 mg/dL   Alkaline Phosphatase 108 44 - 121 IU/L   AST 28 0 - 40 IU/L   ALT 21 0 - 32 IU/L  TSH  Result Value Ref Range   TSH 0.393 (L) 0.450 - 4.500 uIU/mL  Hemoglobin A1c  Result Value Ref Range   Hgb A1c MFr Bld 5.4 4.8 - 5.6 %   Est. average glucose Bld gHb Est-mCnc 108 mg/dL  Vitamin U27  Result Value Ref Range   Vitamin B-12 392 232 - 1,245 pg/mL  CBC with Diff  Result Value Ref Range   WBC 6.7 3.4 - 10.8 x10E3/uL   RBC 4.87 3.77 - 5.28 x10E6/uL   Hemoglobin 14.7 11.1 - 15.9 g/dL   Hematocrit 25.3 66.4 - 46.6 %   MCV 93 79 - 97 fL   MCH 30.2 26.6 - 33.0 pg   MCHC 32.4 31.5 - 35.7 g/dL   RDW 40.3 47.4 - 25.9 %   Platelets 408 150 - 450 x10E3/uL   Neutrophils 89 Not Estab. %   Lymphs 10 Not Estab. %   Monocytes 0 Not Estab. %   Eos 0 Not Estab. %   Basos 0 Not Estab. %   Neutrophils Absolute 6.0 1.4 - 7.0 x10E3/uL   Lymphocytes Absolute 0.6 (L) 0.7 - 3.1 x10E3/uL   Monocytes Absolute 0.0 (L) 0.1 - 0.9 x10E3/uL   EOS (ABSOLUTE) 0.0 0.0 - 0.4 x10E3/uL   Basophils Absolute 0.0 0.0 - 0.2 x10E3/uL   Immature Granulocytes 1 Not Estab. %   Immature Grans (Abs) 0.1 0.0 - 0.1 x10E3/uL   Hematology Comments: Note:   Iron, TIBC and Ferritin Panel  Result Value Ref Range   Total Iron Binding Capacity 410 250 - 450 ug/dL   UIBC 563 875 - 643 ug/dL   Iron 77 27 - 329 ug/dL   Iron Saturation 19 15 - 55 %   Ferritin 58  15 - 150 ng/mL     Recent Results (from the past 2160 hours)  Cortisol Dexamethasone Reflex     Status: None   Collection Time: 12/06/22 12:43 PM  Result Value Ref Range   Cortisol, Serum LCMS <1.0 ug/dL    Comment: A serum cortisol level greater than 1.8 ug/dL after the single-dose overnight dexamethasone is considered a positive test.  Refer to the [facility] Syllabus for more detail.  This test was developed and its performance characteristics determined by [facility]. It has not been cleared or approved by the Food and Drug Administration. Reference Range: <1.8    Dexamethasone Reflex Comment     Comment: Dexamethasone testing not applicable  Lipid panel     Status: Abnormal   Collection Time: 12/06/22 12:43 PM  Result Value Ref Range   Cholesterol, Total 288 (H) 100 - 199 mg/dL   Triglycerides 72 0 - 149 mg/dL   HDL 161 >09 mg/dL   VLDL Cholesterol Cal 11 5 - 40 mg/dL   LDL Chol Calc (NIH) 604 (H) 0 - 99 mg/dL   Chol/HDL Ratio 2.7 0.0 - 4.4 ratio    Comment:                                   T. Chol/HDL Ratio                                             Men  Women                               1/2 Avg.Risk  3.4    3.3                                   Avg.Risk  5.0    4.4                                2X Avg.Risk  9.6    7.1                                3X Avg.Risk 23.4   11.0   VITAMIN D 25 Hydroxy (Vit-D Deficiency, Fractures)     Status: Abnormal   Collection Time: 12/06/22 12:43 PM  Result Value Ref Range   Vit D, 25-Hydroxy 27.9 (L) 30.0 - 100.0 ng/mL    Comment: Vitamin D deficiency has been defined by the Institute of Medicine and an Endocrine Society practice guideline as a level of serum 25-OH vitamin D less than 20 ng/mL (1,2). The Endocrine Society went on to further define vitamin D insufficiency as a level between 21 and 29 ng/mL (2). 1. IOM (Institute of Medicine). 2010. Dietary reference    intakes for calcium and D. [place]: The    [employer]. 2. Holick MF, Binkley [place], Bischoff-Ferrari HA, et al.    Evaluation, treatment, and prevention of vitamin D    deficiency: an Endocrine Society clinical practice    guideline. JCEM. 2011 Jul; 96(7):1911-30.   CMP14+EGFR     Status:  Abnormal   Collection Time: 12/06/22 12:43 PM  Result Value Ref Range   Glucose 136 (H) 70 - 99 mg/dL   BUN 6 (L) 8 - 27 mg/dL   Creatinine, Ser 8.29 0.57 - 1.00 mg/dL   eGFR 93 >56 OZ/HYQ/6.57   BUN/Creatinine Ratio 8 (L) 12 - 28   Sodium 141 134 - 144 mmol/L   Potassium 4.9 3.5 - 5.2 mmol/L   Chloride 102 96 - 106 mmol/L   CO2 22 20 - 29 mmol/L   Calcium 10.3 8.7 - 10.3 mg/dL   Total Protein 8.1 6.0 - 8.5 g/dL   Albumin 5.1 (H) 3.9 - 4.9 g/dL   Globulin, Total 3.0 1.5 - 4.5 g/dL   Bilirubin Total 0.4 0.0 - 1.2 mg/dL   Alkaline Phosphatase 108 44 - 121 IU/L   AST 28 0 - 40 IU/L   ALT 21 0 - 32 IU/L  TSH     Status: Abnormal   Collection Time: 12/06/22 12:43 PM  Result Value Ref Range   TSH 0.393 (L) 0.450 - 4.500 uIU/mL  Hemoglobin A1c     Status: None   Collection Time: 12/06/22 12:43 PM  Result Value Ref Range   Hgb A1c MFr Bld 5.4 4.8 - 5.6 %    Comment:          Prediabetes: 5.7 - 6.4          Diabetes: >6.4          Glycemic control for adults with diabetes: <7.0    Est. average glucose Bld gHb Est-mCnc 108 mg/dL  Vitamin Q46     Status: None   Collection Time: 12/06/22 12:43 PM  Result Value Ref Range   Vitamin B-12 392 232 - 1,245 pg/mL  CBC with Diff     Status: Abnormal   Collection Time: 12/06/22 12:43 PM  Result Value Ref Range   WBC 6.7 3.4 - 10.8 x10E3/uL   RBC 4.87 3.77 - 5.28 x10E6/uL   Hemoglobin 14.7 11.1 - 15.9 g/dL   Hematocrit 96.2 95.2 - 46.6 %   MCV 93 79 - 97 fL   MCH 30.2 26.6 - 33.0 pg   MCHC 32.4 31.5 - 35.7 g/dL   RDW 84.1 32.4 - 40.1 %   Platelets 408 150 - 450 x10E3/uL   Neutrophils 89 Not Estab. %    Comment: Occasional myelocyte seen on scanning.   Lymphs 10 Not Estab. %   Monocytes  0 Not Estab. %   Eos 0 Not Estab. %   Basos 0 Not Estab. %   Neutrophils Absolute 6.0 1.4 - 7.0 x10E3/uL   Lymphocytes Absolute 0.6 (L) 0.7 - 3.1 x10E3/uL   Monocytes Absolute 0.0 (L) 0.1 - 0.9 x10E3/uL   EOS (ABSOLUTE) 0.0 0.0 - 0.4 x10E3/uL   Basophils Absolute 0.0 0.0 - 0.2 x10E3/uL   Immature Granulocytes 1 Not Estab. %   Immature Grans (Abs) 0.1 0.0 - 0.1 x10E3/uL   Hematology Comments: Note:     Comment: Verified by microscopic examination.  Iron, TIBC and Ferritin Panel     Status: None   Collection Time: 12/06/22 12:43 PM  Result Value Ref Range   Total Iron Binding Capacity 410 250 - 450 ug/dL   UIBC 027 253 - 664 ug/dL   Iron 77 27 - 403 ug/dL   Iron Saturation 19 15 - 55 %   Ferritin 58 15 - 150 ng/mL       Assessment & Plan:   Problem  List Items Addressed This Visit       Cardiovascular and Mediastinum   Essential hypertension   Blood pressure well controlled with current medications.  Continue current therapy.  Will reassess at follow up.          Endocrine   Thyroid nodule   Patient stable.  Well controlled with current therapy.   Continue current meds.        Relevant Orders   CMP14+EGFR (Completed)   TSH (Completed)   CBC with Diff (Completed)     Other   Fatigue   Checking labs today.  Will call with results.       Relevant Orders   CMP14+EGFR (Completed)   CBC with Diff (Completed)   Iron, TIBC and Ferritin Panel (Completed)   Vitamin D deficiency   Checking labs today.  Will continue supplements as needed.        Relevant Orders   CMP14+EGFR (Completed)   CBC with Diff (Completed)   Mixed hyperlipidemia   Checking labs today.  Continue current therapy for lipid control. Will modify as needed based on labwork results.        Relevant Orders   Lipid panel (Completed)   CMP14+EGFR (Completed)   CBC with Diff (Completed)   Other Visit Diagnoses       Abnormal weight gain    -  Primary   Relevant Orders   Cortisol  Dexamethasone Reflex (Completed)   CMP14+EGFR (Completed)   CBC with Diff (Completed)     B12 deficiency due to diet       Relevant Orders   CMP14+EGFR (Completed)   Vitamin B12 (Completed)   CBC with Diff (Completed)     Vitamin D deficiency, unspecified       Relevant Orders   VITAMIN D 25 Hydroxy (Vit-D Deficiency, Fractures) (Completed)   CMP14+EGFR (Completed)   CBC with Diff (Completed)     Prediabetes       Relevant Orders   CMP14+EGFR (Completed)   Hemoglobin A1c (Completed)   CBC with Diff (Completed)       Return in about 2 months (around 01/29/2023).   Total time spent: 30 minutes  Miki Kins, FNP  11/29/2022   This document may have been prepared by [facility] Voice Recognition software and as such may include unintentional dictation errors.

## 2023-02-06 NOTE — Assessment & Plan Note (Signed)
 Blood pressure well controlled with current medications.  Continue current therapy.  Will reassess at follow up.

## 2023-02-06 NOTE — Assessment & Plan Note (Signed)
 Checking labs today.  Continue current therapy for lipid control. Will modify as needed based on labwork results.

## 2023-02-06 NOTE — Assessment & Plan Note (Signed)
Checking labs today.  Will call with results.

## 2023-02-06 NOTE — Assessment & Plan Note (Signed)
 Checking labs today.  Will continue supplements as needed.

## 2023-02-15 ENCOUNTER — Other Ambulatory Visit: Payer: Self-pay | Admitting: Family

## 2023-02-17 MED ORDER — OXYCODONE HCL 10 MG PO TABS: 10.0000 mg | ORAL_TABLET | Freq: Two times a day (BID) | ORAL | 0 refills | Status: DC | PRN

## 2023-03-05 LAB — RHEUMATOID ARTHRITIS PROFILE
Cyclic Citrullin Peptide Ab: 4 U (ref 0–19)
Rheumatoid fact SerPl-aCnc: 10.4 [IU]/mL (ref <14.0)

## 2023-03-05 LAB — TSH+T4F+T3FREE
Free T4: 0.81 ng/dL — ABNORMAL LOW (ref 0.82–1.77)
T3, Free: 2.8 pg/mL (ref 2.0–4.4)
TSH: 1.12 u[IU]/mL (ref 0.450–4.500)

## 2023-03-15 ENCOUNTER — Encounter: Payer: Self-pay | Admitting: Family

## 2023-03-15 ENCOUNTER — Ambulatory Visit (INDEPENDENT_AMBULATORY_CARE_PROVIDER_SITE_OTHER): Admitting: Family

## 2023-03-15 VITALS — BP 122/84 | HR 79 | Ht 61.0 in | Wt 158.2 lb

## 2023-03-15 DIAGNOSIS — E559 Vitamin D deficiency, unspecified: Secondary | ICD-10-CM | POA: Diagnosis not present

## 2023-03-15 DIAGNOSIS — M545 Low back pain, unspecified: Secondary | ICD-10-CM

## 2023-03-15 DIAGNOSIS — M5412 Radiculopathy, cervical region: Secondary | ICD-10-CM

## 2023-03-15 DIAGNOSIS — I10 Essential (primary) hypertension: Secondary | ICD-10-CM

## 2023-03-15 DIAGNOSIS — R946 Abnormal results of thyroid function studies: Secondary | ICD-10-CM

## 2023-03-15 DIAGNOSIS — E538 Deficiency of other specified B group vitamins: Secondary | ICD-10-CM | POA: Diagnosis not present

## 2023-03-15 LAB — POCT URINALYSIS DIPSTICK
Bilirubin, UA: NEGATIVE
Blood, UA: NEGATIVE
Glucose, UA: NEGATIVE
Leukocytes, UA: NEGATIVE
Nitrite, UA: NEGATIVE
Protein, UA: NEGATIVE
Spec Grav, UA: 1.025 (ref 1.010–1.025)
Urobilinogen, UA: 0.2 U/dL
pH, UA: 5.5 (ref 5.0–8.0)

## 2023-03-15 NOTE — Progress Notes (Signed)
 Established Patient Office Visit  Subjective:  Patient ID: Carmen Grimes, female    DOB: Feb 05, 1960  Age: 63 y.o. MRN: [id number]  Chief Complaint  Patient presents with   Follow-up    Discuss lab results and back pain    Patient is here today for her 2 months follow up.  She has been feeling fairly well since last appointment.   She does have additional concerns to discuss today.  Has still been having pain in her joints, generalized, with some numbness in her left thumb and fingers.   Labs are due today. She needs refills.   I have reviewed her active problem list, medication list, allergies, health maintenance, notes from last encounter, lab results for her appointment today.      No other concerns at this time.   Past Medical History:  Diagnosis Date   Anxiety state 10/18/2017   Asthma    controlled   Dyspnea 12/2017   difficulty getting her breath at all times   Esophageal reflux 06/26/2017   Formatting of this note might be different from the original.  06/23/18: UGI series - Mild GERD.   Former cigarette smoker 03/09/2018   GERD (gastroesophageal reflux disease)    H. pylori infection 06/19/2018   Formatting of this note might be different from the original.  06/18/08 - H pylori breath test +. S/p amoxicillin  + clarithromycin + omeprazole BID x 10 days. Omeprazole BID continued x 2 months for empiric treatment of possible gastritis.   H/O chest tube placement    H/O: GI bleed    Hiatal hernia    History of bronchoscopy 03/30/2018   History of thoracic surgery 03/30/2018   Hypertension    Lung mass    Mitral valve prolapse    Osteoarthritis    knees   Other nonspecific abnormal finding of lung field 11/18/2017   Other pneumothorax 12/12/2017   Stomach ulcer     Past Surgical History:  Procedure Laterality Date   ABDOMINAL HYSTERECTOMY  2002   ANTERIOR FUSION CERVICAL SPINE  2009   BREAST CYST ASPIRATION Right    COLONOSCOPY  2013   normal    ELECTROMAGNETIC NAVIGATION BROCHOSCOPY Left 12/12/2017   Procedure: ELECTROMAGNETIC NAVIGATION BRONCHOSCOPY;  Surgeon: Cleve Dale, MD;  Location: [facility];  Service: Cardiopulmonary;  Laterality: Left;   ESOPHAGOGASTRODUODENOSCOPY  1990   GI bleed   FLEXIBLE BRONCHOSCOPY Left 12/12/2017   Procedure: FLEXIBLE BRONCHOSCOPY;  Surgeon: Cleve Dale, MD;  Location: [facility];  Service: Cardiopulmonary;  Laterality: Left;   KNEE ARTHROSCOPY Right 2010   x3   KNEE SURGERY Left    Arthroscopy x1   LAPAROSCOPIC SUPRACERVICAL HYSTERECTOMY  2002   with BSO for fibroids/ AUB/ pelvic pain   LAPAROSCOPIC TUBAL LIGATION  1982   LUNG BIOPSY     SHOULDER SURGERY Right    rotator cuff repair   SHOULDER SURGERY Right 2011   second rotator cuff repair   tubal reanastamosis  1985    Social History   Socioeconomic History   Marital status: Single    Spouse name: Not on file   Number of children: 3   Years of education: Not on file   Highest education level: Not on file  Occupational History   Occupation: CMA and phlebotomist    Comment: not working but would like to  Tobacco Use   Smoking status: Former    Current packs/day: 1.00    Average packs/day: 1 pack/day for 13.0 years (13.0 ttl pk-yrs)  Types: Cigarettes   Smokeless tobacco: Never  Vaping Use   Vaping status: Never Used  Substance and Sexual Activity   Alcohol use: Not Currently   Drug use: No   Sexual activity: Not Currently    Birth control/protection: Post-menopausal  Other Topics Concern   Not on file  Social History Narrative   Independent at baseline.  Lives at home by herself   Social Drivers of Health   Financial Resource Strain: Not on file  Food Insecurity: Not on file  Transportation Needs: Not on file  Physical Activity: Sufficiently Active (01/24/2017)   Exercise Vital Sign    Days of Exercise per Week: 5 days    Minutes of Exercise per Session: 50 min  Stress: No Stress Concern Present (01/24/2017)    [employer] of Occupational Health - Occupational Stress Questionnaire    Feeling of Stress : Not at all  Social Connections: Somewhat Isolated (01/24/2017)   Social Connection and Isolation Panel [NHANES]    Frequency of Communication with Friends and Family: More than three times a week    Frequency of Social Gatherings with Friends and Family: More than three times a week    Attends Religious Services: More than 4 times per year    Active Member of [employer] or Organizations: No    Attends Banker Meetings: Never    Marital Status: Divorced  Catering manager Violence: Not At Risk (01/24/2017)   Humiliation, Afraid, Rape, and Kick questionnaire    Fear of Current or Ex-Partner: No    Emotionally Abused: No    Physically Abused: No    Sexually Abused: No    Family History  Problem Relation Age of Onset   Diabetes Mother    Dementia Mother        Alzheimers   Hypertension Mother    Glaucoma Mother    Prostate cancer Father 68   Stroke Father    Arthritis Father    Osteoporosis Father    Prostate cancer Brother    Breast cancer Sister 62   Uterine cancer Paternal Aunt        1 of 4 paternal aunts   Ovarian cancer Maternal Aunt        1 of 5 maternal aunts    Allergies  Allergen Reactions   Ascorbic Acid Hives and Nausea And Vomiting    HIVES / VOMITING   Celecoxib Other (See Comments), Nausea And Vomiting and Shortness Of Breath    GI bleed  Other reaction(s): gi distress, Other (comments), Other (See Comments)  GI BLEED  GI bleeding  GI Bleeding  Other reaction(s): gi distress, Other (See Comments)  GI bleed  Other reaction(s): gi distress, Other (comments), Other (See Comments)  GI BLEED            GI bleeding    GI BLEED    GI Bleeding      GI bleed  Other reaction(s): gi distress, Other (comments), Other (See Comments)  GI BLEED  GI BLEED  GI Bleeding  GI Bleeding    GI BLEED    GI bleed Other reaction(s): gi distress,  Other (comments), Other (See Comments) GI BLEED      GI bleeding  GI BLEED  GI Bleeding   GI bleed Other reaction(s): gi distress, Other (comments), Other (See Comments) GI BLEED    Other reaction(s): gi distress, Other (See Comments) GI bleed Other reaction(s): gi distress, Other (comments), Other (See Comments) GI BLEED  GI bleeding  GI BLEED  GI Bleeding   GI bleed Other reaction(s): gi distress, Other (comments), Other (See Comments) GI BLEED GI BLEED GI Bleeding   Doxycycline Other (See Comments), Nausea And Vomiting and Shortness Of Breath    Unknown reaction.  Other reaction(s): Other (See Comments), Unknown, Unknown (comments)  Pt does not remember  Other reaction(s): Other (See Comments), Other (See Comments), unknown, Unknown (comments)  Unknown reaction.  Other reaction(s): Other (See Comments), Unknown, Unknown (comments)  Pt does not remember  Pt does not remember  Pt does not remember    Unknown reaction. Other reaction(s): Other (See Comments), Unknown, Unknown (comments) Pt does not remember    Other reaction(s): Other (See Comments), Other (See Comments), unknown, Unknown (comments) Unknown reaction. Other reaction(s): Other (See Comments), Unknown, Unknown (comments) Pt does not remember Pt does not remember   Gabapentin Nausea And Vomiting, Other (See Comments) and Shortness Of Breath    Drowsiness.  Like a zombie, not functional  Other reaction(s): Other (See Comments), Other (See Comments), Unknown, Unknown (comments)  Drowsiness.  Like a zombie, not functional  Drowsiness, lethargy  Drowsiness, lethargy    Drowsiness. Like a zombie, not functional    Other reaction(s): Other (See Comments), Other (See Comments), Unknown, Unknown (comments) Drowsiness. Like a zombie, not functional Drowsiness, lethargy  Drowsiness, lethargy   Shellfish Allergy Anaphylaxis    Other reaction(s): anaphylaxis/angioedema  Betadine on skin is okay  Other reaction(s):  anaphylaxis/angioedema Other reaction(s): anaphylaxis/angioedema  Betadine on skin is okay   Strawberry Extract Hives, Nausea And Vomiting, Nausea Only and Rash    Severe vomitting  Other Reaction(s): Skin Rashes, Hives   Tape Dermatitis    Patient stated Tegaderm after surgery as well as a clear tape tear off her top skin layer and leave her raw. Was told to notify team allergy to tape.    Tramadol Other (See Comments)    "blacked out", found wandering in the street  Other Reaction(s): Not available, Other  Other Reaction(s): neurological reaction    confusion    Altered mental status, confusion    CONFUSION    "blacked out", found wandering in the street  Other Reaction(s): neurological reaction, Not available, Other (See Comments)    "blacked out", found wandering in the street    Altered mental status, confusion    CONFUSION    confusion    Other Reaction(s): neurological reaction  confusion  Altered mental status, confusion  CONFUSION  "blacked out", found wandering in the street   Ibuprofen Other (See Comments)    "Blood disorder", GI bleed.  Other reaction(s): gi distress, Other (comments), Other (See Comments)  Other Reaction(s): GI bleeding, Not available, Other  GI Bleeding    GI BLEED    "Blood disorder", GI bleed. Other reaction(s): gi distress, Other (comments), Other (See Comments)  Other Reaction(s): Not available, Other (See Comments)    "Blood disorder", GI bleed. Other reaction(s): gi distress, Other (comments), Other (See Comments)    GI BLEED    GI Bleeding    GI Bleeding  GI BLEED  "Blood disorder", GI bleed. Other reaction(s): gi distress, Other (comments), Other (See Comments)   Montelukast Other (See Comments)    Syncope..passed out  Other Reaction(s): Other  Caused Fainting    Passed out    Syncope..passed out  Other Reaction(s): Not available, other/intolerance, Syncope    Syncope..passed out    Passed out    Caused  Fainting    Caused Fainting  Passed out  Syncope..passed out   Naproxen Other (See Comments)    GI bleed  Other reaction(s): Other (comments), Other (See Comments)  Other Reaction(s): GI Bleed, GI bleeding, Not available, Other  GI Bleeding    GI BLEED    GI bleed Other reaction(s): Other (comments), Other (See Comments)  Other Reaction(s): Other (See Comments)    GI bleed Other reaction(s): Other (comments), Other (See Comments)    GI BLEED    GI Bleeding    GI Bleeding  GI BLEED  GI bleed Other reaction(s): Other (comments), Other (See Comments)   Prednisone  Other (See Comments)    Tremors, severe  Other Reaction(s): Other (See Comments)  Other reaction(s): Other (See Comments), Other (See Comments)  Tremors, severe  Tremors and mental fogginess with oral prednisone , tolerates topical and inhaled  Tremors and mental fogginess with oral prednisone , tolerates topical and inhaled    Tremors, severe    Other reaction(s): Other (See Comments), Other (See Comments) Tremors, severe Tremors and mental fogginess with oral prednisone , tolerates topical and inhaled   Ascorbate Hives    HIVES / VOMITING   Methotrexate Derivatives Nausea And Vomiting   Sesame Oil     Other Reaction(s): Sesame Seed   Soy Allergy (Obsolete)     Other Reaction(s): Soybean   Egg-Derived Products Other (See Comments) and Rash    rash  Other reaction(s): Other (See Comments)  rash  rash    Other reaction(s): Other (See Comments) rash   Latex Itching and Rash    Other reaction(s): mild rash/itching  Other Reaction(s): Not available, Skin Rashes, Hives  Other reaction(s): rash/itching   Levothyroxine  Rash   Metronidazole Nausea Only    Fatigue  Pt states she gets extremely nauseas and weak.   Nsaids Nausea And Vomiting and Other (See Comments)    Gastric bleed  Other Reaction(s): GI Bleed, Other  Other reaction(s): Other (See Comments), other/intolerance  Gastric bleed  Gastric  bleed  Gastric bleed   Strawberry Flavoring Agent (Non-Screening) Hives   Sulfamethoxazole-Trimethoprim Other (See Comments)    Other reaction(s): Other (See Comments), other/intolerance  MAKES PAIN WORSE  Increases pain  Other Reaction(s): Other (See Comments)  Pt does not remember  Increases pain    Pt does not remember    MAKES PAIN WORSE    Other reaction(s): Other (See Comments), other/intolerance MAKES PAIN WORSE  Increases pain Increases pain    Review of Systems  Musculoskeletal:  Positive for joint pain.  Neurological:  Positive for tingling and sensory change.  All other systems reviewed and are negative.      Objective:   BP 122/84   Pulse 79   Ht 5\' 1"  (1.549 m)   Wt 158 lb 3.2 oz (71.8 kg)   SpO2 97%   BMI 29.89 kg/m   Vitals:   03/15/23 1303  BP: 122/84  Pulse: 79  Height: 5\' 1"  (1.549 m)  Weight: 158 lb 3.2 oz (71.8 kg)  SpO2: 97%  BMI (Calculated): 29.91    Physical Exam Vitals and nursing note reviewed.  Constitutional:      Appearance: Normal appearance. She is normal weight.  HENT:     Head: Normocephalic.  Eyes:     Extraocular Movements: Extraocular movements intact.     Conjunctiva/sclera: Conjunctivae normal.     Pupils: Pupils are equal, round, and reactive to light.  Cardiovascular:     Rate and Rhythm: Normal rate.  Pulmonary:     Effort: Pulmonary effort is  normal.  Neurological:     General: No focal deficit present.     Mental Status: She is alert and oriented to person, place, and time. Mental status is at baseline.  Psychiatric:        Mood and Affect: Mood normal.        Behavior: Behavior normal.        Thought Content: Thought content normal.        Judgment: Judgment normal.      Results for orders placed or performed in visit on 03/15/23  TSH+T4F+T3Free  Result Value Ref Range   TSH 1.220 0.450 - 4.500 uIU/mL   T3, Free 2.9 2.0 - 4.4 pg/mL   Free T4 0.78 (L) 0.82 - 1.77 ng/dL  Vitamin D  (25 hydroxy)   Result Value Ref Range   Vit D, 25-Hydroxy 38.1 30.0 - 100.0 ng/mL  Vitamin B12  Result Value Ref Range   Vitamin B-12 536 232 - 1,245 pg/mL  POCT Urinalysis Dipstick (81002)  Result Value Ref Range   Color, UA yellow    Clarity, UA clear    Glucose, UA Negative Negative   Bilirubin, UA negative    Ketones, UA trace    Spec Grav, UA 1.025 1.010 - 1.025   Blood, UA negative    pH, UA 5.5 5.0 - 8.0   Protein, UA Negative Negative   Urobilinogen, UA 0.2 0.2 or 1.0 E.U./dL   Nitrite, UA negative    Leukocytes, UA Negative Negative   Appearance clear    Odor no     Recent Results (from the past 2160 hours)  TSH+T4F+T3Free     Status: Abnormal   Collection Time: 03/03/23 12:18 PM  Result Value Ref Range   TSH 1.120 0.450 - 4.500 uIU/mL   T3, Free 2.8 2.0 - 4.4 pg/mL   Free T4 0.81 (L) 0.82 - 1.77 ng/dL  Rheumatoid Arthritis Profile     Status: None   Collection Time: 03/03/23 12:18 PM  Result Value Ref Range   Rheumatoid fact SerPl-aCnc 10.4 <14.0 IU/mL   Cyclic Citrullin Peptide Ab 4 0 - 19 units    Comment:                           Negative               <20                           Weak positive      20 - 39                           Moderate positive  40 - 59                           Strong positive        >59   POCT Urinalysis Dipstick (69629)     Status: None   Collection Time: 03/15/23  2:17 PM  Result Value Ref Range   Color, UA yellow    Clarity, UA clear    Glucose, UA Negative Negative   Bilirubin, UA negative    Ketones, UA trace    Spec Grav, UA 1.025 1.010 - 1.025   Blood, UA negative    pH, UA 5.5 5.0 - 8.0   Protein, UA Negative Negative  Urobilinogen, UA 0.2 0.2 or 1.0 E.U./dL   Nitrite, UA negative    Leukocytes, UA Negative Negative   Appearance clear    Odor no   TSH+T4F+T3Free     Status: Abnormal   Collection Time: 04/21/23 12:17 PM  Result Value Ref Range   TSH 1.220 0.450 - 4.500 uIU/mL   T3, Free 2.9 2.0 - 4.4 pg/mL   Free T4 0.78  (L) 0.82 - 1.77 ng/dL  Vitamin D  (25 hydroxy)     Status: None   Collection Time: 04/21/23 12:17 PM  Result Value Ref Range   Vit D, 25-Hydroxy 38.1 30.0 - 100.0 ng/mL    Comment: Vitamin D  deficiency has been defined by the Institute of Medicine and an Endocrine Society practice guideline as a level of serum 25-OH vitamin D  less than 20 ng/mL (1,2). The Endocrine Society went on to further define vitamin D  insufficiency as a level between 21 and 29 ng/mL (2). 1. IOM (Institute of Medicine). 2010. Dietary reference    intakes for calcium  and D. [place]: The    [employer]. 2. Holick MF, Binkley [place], Bischoff-Ferrari HA, et al.    Evaluation, treatment, and prevention of vitamin D     deficiency: an Endocrine Society clinical practice    guideline. JCEM. 2011 Jul; 96(7):1911-30.   Vitamin B12     Status: None   Collection Time: 04/21/23 12:17 PM  Result Value Ref Range   Vitamin B-12 536 232 - 1,245 pg/mL       Assessment & Plan:   Problem List Items Addressed This Visit       Cardiovascular and Mediastinum   Essential hypertension - Primary   Blood pressure well controlled with current medications.  Continue current therapy.  Will reassess at follow up.         Nervous and Auditory   Cervical radiculopathy   Suspect this is continuing to be related to her neck/cervical issues.   Will reassess at follow up.         Other   Low back pain   Patient stable.  Well controlled with current therapy.   Continue current meds.        Relevant Orders   POCT Urinalysis Dipstick (81002) (Completed)   Vitamin D  deficiency   Checking labs today.  Will continue supplements as needed.        Relevant Orders   Vitamin D  (25 hydroxy) (Completed)   Other Visit Diagnoses       Nonspecific abnormal results of function study of thyroid        Relevant Orders   TSH+T4F+T3Free (Completed)     B12 deficiency due to diet       Relevant Orders   Vitamin B12  (Completed)       Return in about 3 months (around 06/15/2023).   Total time spent: 20 minutes  Trenda Frisk, FNP  03/15/2023   This document may have been prepared by [facility] Voice Recognition software and as such may include unintentional dictation errors.

## 2023-03-17 ENCOUNTER — Other Ambulatory Visit: Payer: Self-pay

## 2023-03-18 MED ORDER — OXYCODONE HCL 10 MG PO TABS: 10.0000 mg | ORAL_TABLET | Freq: Two times a day (BID) | ORAL | 0 refills | Status: DC | PRN

## 2023-03-19 ENCOUNTER — Encounter: Payer: Self-pay | Admitting: Family

## 2023-03-19 NOTE — Assessment & Plan Note (Signed)
 Sending refill for patient for her medications.  She is using appropriately and is not asking for additional fills.  Will reassess at follow up.

## 2023-04-19 ENCOUNTER — Other Ambulatory Visit: Payer: Self-pay | Admitting: Family

## 2023-04-20 MED ORDER — OXYCODONE HCL 10 MG PO TABS: 10.0000 mg | ORAL_TABLET | Freq: Two times a day (BID) | ORAL | 0 refills | Status: DC | PRN

## 2023-04-22 LAB — VITAMIN D 25 HYDROXY (VIT D DEFICIENCY, FRACTURES): Vit D, 25-Hydroxy: 38.1 ng/mL (ref 30.0–100.0)

## 2023-04-22 LAB — TSH+T4F+T3FREE
Free T4: 0.78 ng/dL — ABNORMAL LOW (ref 0.82–1.77)
T3, Free: 2.9 pg/mL (ref 2.0–4.4)
TSH: 1.22 u[IU]/mL (ref 0.450–4.500)

## 2023-04-22 LAB — VITAMIN B12: Vitamin B-12: 536 pg/mL (ref 232–1245)

## 2023-04-26 ENCOUNTER — Encounter: Payer: Self-pay | Admitting: Family

## 2023-04-28 ENCOUNTER — Encounter: Payer: Self-pay | Admitting: Family

## 2023-05-02 ENCOUNTER — Other Ambulatory Visit: Payer: Self-pay

## 2023-05-02 DIAGNOSIS — Z13820 Encounter for screening for osteoporosis: Secondary | ICD-10-CM

## 2023-05-02 DIAGNOSIS — R109 Unspecified abdominal pain: Secondary | ICD-10-CM

## 2023-05-02 DIAGNOSIS — Z1231 Encounter for screening mammogram for malignant neoplasm of breast: Secondary | ICD-10-CM

## 2023-05-03 ENCOUNTER — Other Ambulatory Visit: Payer: Self-pay

## 2023-05-03 MED ORDER — LEVOTHYROXINE SODIUM 25 MCG PO TABS: 25.0000 ug | ORAL_TABLET | Freq: Every day | ORAL | 3 refills | Status: AC

## 2023-05-16 ENCOUNTER — Encounter: Payer: Self-pay | Admitting: Family

## 2023-05-16 ENCOUNTER — Ambulatory Visit (INDEPENDENT_AMBULATORY_CARE_PROVIDER_SITE_OTHER): Admitting: Family

## 2023-05-16 VITALS — BP 114/82 | HR 77 | Ht 61.0 in | Wt 155.4 lb

## 2023-05-16 DIAGNOSIS — M5412 Radiculopathy, cervical region: Secondary | ICD-10-CM

## 2023-05-16 DIAGNOSIS — M5030 Other cervical disc degeneration, unspecified cervical region: Secondary | ICD-10-CM

## 2023-05-16 DIAGNOSIS — M542 Cervicalgia: Secondary | ICD-10-CM

## 2023-05-16 DIAGNOSIS — Z0130 Encounter for examination of blood pressure without abnormal findings: Secondary | ICD-10-CM

## 2023-05-16 DIAGNOSIS — R52 Pain, unspecified: Secondary | ICD-10-CM

## 2023-05-16 DIAGNOSIS — M47812 Spondylosis without myelopathy or radiculopathy, cervical region: Secondary | ICD-10-CM | POA: Diagnosis not present

## 2023-05-16 DIAGNOSIS — M150 Primary generalized (osteo)arthritis: Secondary | ICD-10-CM

## 2023-05-16 NOTE — Progress Notes (Signed)
 Acute Office Visit  Subjective:     Patient ID: Carmen Grimes, female    DOB: [birth year], 63 y.o.   MRN: [id number]  Patient is in today for  Chief Complaint  Patient presents with   Back Pain    Neck Pain  The current episode started more than 1 month ago. The problem occurs constantly. The problem has been gradually worsening. The pain is associated with nothing. The pain is present in the right side. The quality of the pain is described as stabbing and shooting. The pain is at a severity of 10/10. The pain is severe. The symptoms are aggravated by twisting, position, sneezing and bending. Stiffness is present All day. She has tried acetaminophen , NSAIDs, home exercises, ice and bed rest for the symptoms.     Review of Systems  All other systems reviewed and are negative.       Objective:     BP 114/82   Pulse 77   Ht 5' 1 (1.549 m)   Wt 155 lb 6.4 oz (70.5 kg)   SpO2 99%   BMI 29.36 kg/m   Physical Exam Vitals and nursing note reviewed.  Constitutional:      Appearance: Normal appearance. She is normal weight.  HENT:     Head: Normocephalic.  Eyes:     Extraocular Movements: Extraocular movements intact.     Conjunctiva/sclera: Conjunctivae normal.     Pupils: Pupils are equal, round, and reactive to light.  Cardiovascular:     Rate and Rhythm: Normal rate.  Pulmonary:     Effort: Pulmonary effort is normal.  Neurological:     General: No focal deficit present.     Mental Status: She is alert and oriented to person, place, and time. Mental status is at baseline.  Psychiatric:        Mood and Affect: Mood normal.        Behavior: Behavior normal.        Thought Content: Thought content normal.    No results found for any visits on 05/16/23.  Recent Results (from the past 2160 hours)  TSH+T4F+T3Free     Status: Abnormal   Collection Time: 03/03/23 12:18 PM  Result Value Ref Range   TSH 1.120 0.450 - 4.500 uIU/mL   T3, Free 2.8 2.0 - 4.4 pg/mL    Free T4 0.81 (L) 0.82 - 1.77 ng/dL  Rheumatoid Arthritis Profile     Status: None   Collection Time: 03/03/23 12:18 PM  Result Value Ref Range   Rheumatoid fact SerPl-aCnc 10.4 <14.0 IU/mL   Cyclic Citrullin Peptide Ab 4 0 - 19 units    Comment:                           Negative               <20                           Weak positive      20 - 39                           Moderate positive  40 - 59                           Strong positive        >  59   POCT Urinalysis Dipstick (18997)     Status: None   Collection Time: 03/15/23  2:17 PM  Result Value Ref Range   Color, UA yellow    Clarity, UA clear    Glucose, UA Negative Negative   Bilirubin, UA negative    Ketones, UA trace    Spec Grav, UA 1.025 1.010 - 1.025   Blood, UA negative    pH, UA 5.5 5.0 - 8.0   Protein, UA Negative Negative   Urobilinogen, UA 0.2 0.2 or 1.0 E.U./dL   Nitrite, UA negative    Leukocytes, UA Negative Negative   Appearance clear    Odor no   TSH+T4F+T3Free     Status: Abnormal   Collection Time: 04/21/23 12:17 PM  Result Value Ref Range   TSH 1.220 0.450 - 4.500 uIU/mL   T3, Free 2.9 2.0 - 4.4 pg/mL   Free T4 0.78 (L) 0.82 - 1.77 ng/dL  Vitamin D  (25 hydroxy)     Status: None   Collection Time: 04/21/23 12:17 PM  Result Value Ref Range   Vit D, 25-Hydroxy 38.1 30.0 - 100.0 ng/mL    Comment: Vitamin D  deficiency has been defined by the Institute of Medicine and an Endocrine Society practice guideline as a level of serum 25-OH vitamin D  less than 20 ng/mL (1,2). The Endocrine Society went on to further define vitamin D  insufficiency as a level between 21 and 29 ng/mL (2). 1. IOM (Institute of Medicine). 2010. Dietary reference    intakes for calcium  and D. [place]: The    [employer]. 2. Holick MF, Binkley [place], Bischoff-Ferrari HA, et al.    Evaluation, treatment, and prevention of vitamin D     deficiency: an Endocrine Society clinical practice    guideline. JCEM. 2011  Jul; 96(7):1911-30.   Vitamin B12     Status: None   Collection Time: 04/21/23 12:17 PM  Result Value Ref Range   Vitamin B-12 536 232 - 1,245 pg/mL    Allergies as of 05/16/2023       Reactions   Ascorbic Acid Hives, Nausea And Vomiting   HIVES / VOMITING   Celecoxib Other (See Comments), Nausea And Vomiting, Shortness Of Breath   GI bleed Other reaction(s): gi distress, Other (comments), Other (See Comments) GI BLEED GI bleeding GI Bleeding Other reaction(s): gi distress, Other (See Comments)  GI bleed  Other reaction(s): gi distress, Other (comments), Other (See Comments)  GI BLEED            GI bleeding    GI BLEED    GI Bleeding      GI bleed  Other reaction(s): gi distress, Other (comments), Other (See Comments)  GI BLEED  GI BLEED  GI Bleeding GI Bleeding    GI BLEED    GI bleed Other reaction(s): gi distress, Other (comments), Other (See Comments) GI BLEED      GI bleeding  GI BLEED  GI Bleeding   GI bleed Other reaction(s): gi distress, Other (comments), Other (See Comments) GI BLEED    Other reaction(s): gi distress, Other (See Comments) GI bleed Other reaction(s): gi distress, Other (comments), Other (See Comments) GI BLEED      GI bleeding  GI BLEED  GI Bleeding   GI bleed Other reaction(s): gi distress, Other (comments), Other (See Comments) GI BLEED GI BLEED GI Bleeding   Doxycycline Other (See Comments), Nausea And Vomiting, Shortness Of Breath   Unknown reaction. Other reaction(s): Other (See Comments),  Unknown, Unknown (comments) Pt does not remember Other reaction(s): Other (See Comments), Other (See Comments), unknown, Unknown (comments)  Unknown reaction.  Other reaction(s): Other (See Comments), Unknown, Unknown (comments)  Pt does not remember  Pt does not remember Pt does not remember    Unknown reaction. Other reaction(s): Other (See Comments), Unknown, Unknown (comments) Pt does not remember    Other reaction(s): Other (See  Comments), Other (See Comments), unknown, Unknown (comments) Unknown reaction. Other reaction(s): Other (See Comments), Unknown, Unknown (comments) Pt does not remember Pt does not remember   Gabapentin Nausea And Vomiting, Other (See Comments), Shortness Of Breath   Drowsiness. Like a zombie, not functional Other reaction(s): Other (See Comments), Other (See Comments), Unknown, Unknown (comments)  Drowsiness.  Like a zombie, not functional  Drowsiness, lethargy Drowsiness, lethargy    Drowsiness. Like a zombie, not functional    Other reaction(s): Other (See Comments), Other (See Comments), Unknown, Unknown (comments) Drowsiness. Like a zombie, not functional Drowsiness, lethargy Drowsiness, lethargy   Shellfish Allergy Anaphylaxis   Other reaction(s): anaphylaxis/angioedema Betadine on skin is okay Other reaction(s): anaphylaxis/angioedema Other reaction(s): anaphylaxis/angioedema  Betadine on skin is okay   Strawberry Extract Hives, Nausea And Vomiting, Nausea Only, Rash   Severe vomitting Other Reaction(s): Skin Rashes, Hives   Tape Dermatitis   Patient stated Tegaderm after surgery as well as a clear tape tear off her top skin layer and leave her raw. Was told to notify team allergy to tape.    Tramadol Other (See Comments)   blacked out, found wandering in the street Other Reaction(s): Not available, Other Other Reaction(s): neurological reaction    confusion    Altered mental status, confusion    CONFUSION    blacked out, found wandering in the street Other Reaction(s): neurological reaction, Not available, Other (See Comments)    blacked out, found wandering in the street    Altered mental status, confusion    CONFUSION    confusion    Other Reaction(s): neurological reaction  confusion  Altered mental status, confusion  CONFUSION  blacked out, found wandering in the street   Ibuprofen Other (See Comments)   Blood disorder, GI bleed. Other  reaction(s): gi distress, Other (comments), Other (See Comments) Other Reaction(s): GI bleeding, Not available, Other GI Bleeding    GI BLEED    Blood disorder, GI bleed. Other reaction(s): gi distress, Other (comments), Other (See Comments) Other Reaction(s): Not available, Other (See Comments)    Blood disorder, GI bleed. Other reaction(s): gi distress, Other (comments), Other (See Comments)    GI BLEED    GI Bleeding    GI Bleeding  GI BLEED  Blood disorder, GI bleed. Other reaction(s): gi distress, Other (comments), Other (See Comments)   Montelukast Other (See Comments)   Syncope..passed out Other Reaction(s): Other Caused Fainting    Passed out    Syncope..passed out Other Reaction(s): Not available, other/intolerance, Syncope    Syncope..passed out    Passed out    Caused Fainting    Caused Fainting  Passed out  Syncope..passed out   Naproxen Other (See Comments)   GI bleed Other reaction(s): Other (comments), Other (See Comments) Other Reaction(s): GI Bleed, GI bleeding, Not available, Other GI Bleeding    GI BLEED    GI bleed Other reaction(s): Other (comments), Other (See Comments) Other Reaction(s): Other (See Comments)    GI bleed Other reaction(s): Other (comments), Other (See Comments)    GI BLEED    GI Bleeding  GI Bleeding  GI BLEED  GI bleed Other reaction(s): Other (comments), Other (See Comments)   Prednisone  Other (See Comments)   Tremors, severe Other Reaction(s): Other (See Comments) Other reaction(s): Other (See Comments), Other (See Comments)  Tremors, severe  Tremors and mental fogginess with oral prednisone , tolerates topical and inhaled Tremors and mental fogginess with oral prednisone , tolerates topical and inhaled    Tremors, severe    Other reaction(s): Other (See Comments), Other (See Comments) Tremors, severe Tremors and mental fogginess with oral prednisone , tolerates topical and inhaled   Ascorbate Hives   HIVES  / VOMITING   Methotrexate Derivatives Nausea And Vomiting   Sesame Oil    Other Reaction(s): Sesame Seed   Soy Allergy (obsolete)    Other Reaction(s): Soybean   Egg-derived Products Other (See Comments), Rash   rash Other reaction(s): Other (See Comments)  rash rash    Other reaction(s): Other (See Comments) rash   Latex Itching, Rash   Other reaction(s): mild rash/itching Other Reaction(s): Not available, Skin Rashes, Hives Other reaction(s): rash/itching   Levothyroxine  Rash   Metronidazole Nausea Only   Fatigue Pt states she gets extremely nauseas and weak.   Nsaids Nausea And Vomiting, Other (See Comments)   Gastric bleed Other Reaction(s): GI Bleed, Other Other reaction(s): Other (See Comments), other/intolerance  Gastric bleed  Gastric bleed  Gastric bleed   Strawberry Flavoring Agent (non-screening) Hives   Sulfamethoxazole-trimethoprim Other (See Comments)   Other reaction(s): Other (See Comments), other/intolerance MAKES PAIN WORSE Increases pain Other Reaction(s): Other (See Comments) Pt does not remember Increases pain    Pt does not remember    MAKES PAIN WORSE    Other reaction(s): Other (See Comments), other/intolerance MAKES PAIN WORSE  Increases pain Increases pain        Medication List        Accurate as of May 16, 2023 11:42 AM. If you have any questions, ask your nurse or doctor.          Ashwagandha 500 MG Chew Chew by mouth.   iVIZIA Dry Eyes 0.5 % Soln Generic drug: Povidone (PF) Apply to eye.   levothyroxine  25 MCG tablet Commonly known as: SYNTHROID  Take 1 tablet (25 mcg total) by mouth daily before breakfast.   Oxycodone  HCl 10 MG Tabs Take 1 tablet (10 mg total) by mouth 2 (two) times daily as needed (severe pain).   Pataday  0.2 % Soln Generic drug: Olopatadine  HCl Apply to eye.   phentermine  37.5 MG tablet Commonly known as: ADIPEX-P  Take 1 tablet (37.5 mg total) by mouth daily before breakfast.   rosuvastatin   20 MG tablet Commonly known as: CRESTOR  Take 1 tablet (20 mg total) by mouth daily.   Ventolin  HFA 108 (90 Base) MCG/ACT inhaler Generic drug: albuterol  Inhale 2 puffs into the lungs every 6 (six) hours as needed for wheezing or shortness of breath.   Vitamin D  (Ergocalciferol ) 1.25 MG (50000 UNIT) Caps capsule Commonly known as: DRISDOL TAKE 1 CAPSULE BY MOUTH ONE TIME PER WEEK   Wixela Inhub 250-50 MCG/ACT Aepb Generic drug: fluticasone-salmeterol Inhale 1 puff into the lungs in the morning and at bedtime.            Assessment & Plan Cervical spondylosis without myelopathy Cervical pain (neck) Cervical radiculopathy DDD (degenerative disc disease), cervical Patient has had multiple scans of her neck at this point, unsure if there is anything else we can do short of additional NS intervention.  Will send RX if needed.  Primary osteoarthritis involving multiple joints Complaints of total body pain Patient stable.  Well controlled with current therapy.   Continue current meds.      No follow-ups on file.  Total time spent: 20 minutes  ALAN CHRISTELLA ARRANT, FNP  05/16/2023   This document may have been prepared by [facility] Voice Recognition software and as such may include unintentional dictation errors.

## 2023-05-18 ENCOUNTER — Encounter: Payer: Self-pay | Admitting: Family

## 2023-05-18 MED ORDER — OXYCODONE HCL 10 MG PO TABS: 10.0000 mg | ORAL_TABLET | Freq: Two times a day (BID) | ORAL | 0 refills | Status: DC | PRN

## 2023-05-28 ENCOUNTER — Encounter: Payer: Self-pay | Admitting: Family

## 2023-05-28 NOTE — Assessment & Plan Note (Signed)
 Suspect this is continuing to be related to her neck/cervical issues.   Will reassess at follow up.

## 2023-05-28 NOTE — Assessment & Plan Note (Signed)
 Checking labs today.  Will continue supplements as needed.

## 2023-05-28 NOTE — Assessment & Plan Note (Signed)
 Blood pressure well controlled with current medications.  Continue current therapy.  Will reassess at follow up.

## 2023-05-28 NOTE — Assessment & Plan Note (Signed)
 Patient stable.  Well controlled with current therapy.   Continue current meds.

## 2023-06-16 ENCOUNTER — Other Ambulatory Visit: Payer: Self-pay | Admitting: Family

## 2023-06-16 DIAGNOSIS — Z13820 Encounter for screening for osteoporosis: Secondary | ICD-10-CM

## 2023-06-16 DIAGNOSIS — Z1231 Encounter for screening mammogram for malignant neoplasm of breast: Secondary | ICD-10-CM

## 2023-06-16 DIAGNOSIS — M542 Cervicalgia: Secondary | ICD-10-CM

## 2023-06-16 NOTE — Telephone Encounter (Signed)
 See other message

## 2023-06-30 ENCOUNTER — Other Ambulatory Visit: Payer: Self-pay | Admitting: Family

## 2023-06-30 MED ORDER — OXYCODONE HCL 10 MG PO TABS: 10.0000 mg | ORAL_TABLET | Freq: Two times a day (BID) | ORAL | 0 refills | Status: DC | PRN

## 2023-07-13 DIAGNOSIS — E785 Hyperlipidemia, unspecified: Secondary | ICD-10-CM | POA: Insufficient documentation

## 2023-07-21 ENCOUNTER — Ambulatory Visit: Payer: Self-pay | Admitting: Family

## 2023-07-21 ENCOUNTER — Encounter: Payer: Self-pay | Admitting: Family

## 2023-07-26 ENCOUNTER — Other Ambulatory Visit: Payer: Self-pay

## 2023-07-26 MED ORDER — OXYCODONE HCL 10 MG PO TABS: 10.0000 mg | ORAL_TABLET | Freq: Two times a day (BID) | ORAL | 0 refills | Status: DC | PRN

## 2023-07-26 NOTE — Telephone Encounter (Signed)
 Request for pain meds have been sent

## 2023-08-02 ENCOUNTER — Encounter: Payer: Self-pay | Admitting: Family

## 2023-08-07 ENCOUNTER — Encounter: Payer: Self-pay | Admitting: Family

## 2023-08-07 NOTE — Assessment & Plan Note (Signed)
 Patient has had multiple scans of her neck at this point, unsure if there is anything else we can do short of additional NS intervention.  Will send RX if needed.

## 2023-08-07 NOTE — Assessment & Plan Note (Signed)
 Patient stable.  Well controlled with current therapy.   Continue current meds.

## 2023-08-19 ENCOUNTER — Other Ambulatory Visit: Payer: Self-pay | Admitting: Family

## 2023-08-26 ENCOUNTER — Other Ambulatory Visit: Payer: Self-pay | Admitting: Internal Medicine

## 2023-08-26 DIAGNOSIS — M542 Cervicalgia: Secondary | ICD-10-CM

## 2023-08-26 MED ORDER — OXYCODONE HCL 10 MG PO TABS: ORAL_TABLET | ORAL | 0 refills | Status: DC

## 2023-09-21 ENCOUNTER — Encounter: Payer: Self-pay | Admitting: Family

## 2023-09-21 ENCOUNTER — Ambulatory Visit (INDEPENDENT_AMBULATORY_CARE_PROVIDER_SITE_OTHER): Admitting: Family

## 2023-09-21 VITALS — BP 136/82 | HR 83 | Ht 61.0 in | Wt 152.2 lb

## 2023-09-21 DIAGNOSIS — M5030 Other cervical disc degeneration, unspecified cervical region: Secondary | ICD-10-CM | POA: Diagnosis not present

## 2023-09-21 DIAGNOSIS — M47812 Spondylosis without myelopathy or radiculopathy, cervical region: Secondary | ICD-10-CM

## 2023-09-21 DIAGNOSIS — G894 Chronic pain syndrome: Secondary | ICD-10-CM

## 2023-09-21 DIAGNOSIS — Z79899 Other long term (current) drug therapy: Secondary | ICD-10-CM

## 2023-09-21 DIAGNOSIS — Z0130 Encounter for examination of blood pressure without abnormal findings: Secondary | ICD-10-CM

## 2023-09-21 NOTE — Progress Notes (Signed)
 Established Patient Office Visit  Subjective:  Patient ID: Carmen Grimes, female    DOB: 14-Jul-1960  Age: 63 y.o. MRN: [id number]  Chief Complaint  Patient presents with   Follow-up    Discuss lab results, thumb numbness    Patient is here today for her follow up.  She has been feeling poorly since last appointment.   She does have additional concerns to discuss today.  She has continued to have severe pain in her upper and lower back, as well as her shoulder.  She needs a referral to a pain clinic, wants to try and find one closer to home.   She also needs a letter today for housing, to state that she would benefit from being allowed to move back here sooner than her allotted time to do so.   Labs are not due today.  She needs refills.   I have reviewed her active problem list, medication list, allergies, notes from last encounter, lab results for her appointment today.      No other concerns at this time.   Past Medical History:  Diagnosis Date   Anxiety state 10/18/2017   Asthma    controlled   Dyspnea 12/2017   difficulty getting her breath at all times   Esophageal reflux 06/26/2017   Formatting of this note might be different from the original.  06/23/18: UGI series - Mild GERD.   Former cigarette smoker 03/09/2018   GERD (gastroesophageal reflux disease)    H. pylori infection 06/19/2018   Formatting of this note might be different from the original.  06/18/08 - H pylori breath test +. S/p amoxicillin  + clarithromycin + omeprazole BID x 10 days. Omeprazole BID continued x 2 months for empiric treatment of possible gastritis.   H/O chest tube placement    H/O: GI bleed    Hiatal hernia    History of bronchoscopy 03/30/2018   History of thoracic surgery 03/30/2018   Hypertension    Lung mass    Mitral valve prolapse    Osteoarthritis    knees   Other nonspecific abnormal finding of lung field 11/18/2017   Other pneumothorax 12/12/2017   Stomach ulcer      Past Surgical History:  Procedure Laterality Date   ABDOMINAL HYSTERECTOMY  2002   ANTERIOR FUSION CERVICAL SPINE  2009   BREAST CYST ASPIRATION Right    COLONOSCOPY  2013   normal   ELECTROMAGNETIC NAVIGATION BROCHOSCOPY Left 12/12/2017   Procedure: ELECTROMAGNETIC NAVIGATION BRONCHOSCOPY;  Surgeon: Isaiah Scrivener, MD;  Location: [facility];  Service: Cardiopulmonary;  Laterality: Left;   ESOPHAGOGASTRODUODENOSCOPY  1990   GI bleed   FLEXIBLE BRONCHOSCOPY Left 12/12/2017   Procedure: FLEXIBLE BRONCHOSCOPY;  Surgeon: Isaiah Scrivener, MD;  Location: [facility];  Service: Cardiopulmonary;  Laterality: Left;   KNEE ARTHROSCOPY Right 2010   x3   KNEE SURGERY Left    Arthroscopy x1   LAPAROSCOPIC SUPRACERVICAL HYSTERECTOMY  2002   with BSO for fibroids/ AUB/ pelvic pain   LAPAROSCOPIC TUBAL LIGATION  1982   LUNG BIOPSY     SHOULDER SURGERY Right    rotator cuff repair   SHOULDER SURGERY Right 2011   second rotator cuff repair   tubal reanastamosis  1985    Social History   Socioeconomic History   Marital status: Single    Spouse name: Not on file   Number of children: 3   Years of education: Not on file   Highest education level: Not on file  Occupational History   Occupation: CMA and phlebotomist    Comment: not working but would like to  Tobacco Use   Smoking status: Former    Current packs/day: 1.00    Average packs/day: 1 pack/day for 13.0 years (13.0 ttl pk-yrs)    Types: Cigarettes   Smokeless tobacco: Never  Vaping Use   Vaping status: Never Used  Substance and Sexual Activity   Alcohol use: Not Currently   Drug use: No   Sexual activity: Not Currently    Birth control/protection: Post-menopausal  Other Topics Concern   Not on file  Social History Narrative   Independent at baseline.  Lives at home by herself   Social Drivers of Health   Financial Resource Strain: Not on file  Food Insecurity: Not on file  Transportation Needs: Not on file  Physical  Activity: Sufficiently Active (01/24/2017)   Exercise Vital Sign    Days of Exercise per Week: 5 days    Minutes of Exercise per Session: 50 min  Stress: No Stress Concern Present (01/24/2017)   [employer] of Occupational Health - Occupational Stress Questionnaire    Feeling of Stress : Not at all  Social Connections: Somewhat Isolated (01/24/2017)   Social Connection and Isolation Panel    Frequency of Communication with Friends and Family: More than three times a week    Frequency of Social Gatherings with Friends and Family: More than three times a week    Attends Religious Services: More than 4 times per year    Active Member of [employer] or Organizations: No    Attends Banker Meetings: Never    Marital Status: Divorced  Catering manager Violence: Not At Risk (01/24/2017)   Humiliation, Afraid, Rape, and Kick questionnaire    Fear of Current or Ex-Partner: No    Emotionally Abused: No    Physically Abused: No    Sexually Abused: No    Family History  Problem Relation Age of Onset   Diabetes Mother    Dementia Mother        Alzheimers   Hypertension Mother    Glaucoma Mother    Prostate cancer Father 74   Stroke Father    Arthritis Father    Osteoporosis Father    Prostate cancer Brother    Breast cancer Sister 72   Uterine cancer Paternal Aunt        1 of 4 paternal aunts   Ovarian cancer Maternal Aunt        1 of 5 maternal aunts    Allergies  Allergen Reactions   Ascorbic Acid Hives and Nausea And Vomiting    HIVES / VOMITING   Celecoxib Other (See Comments), Nausea And Vomiting and Shortness Of Breath    GI bleed  Other reaction(s): gi distress, Other (comments), Other (See Comments)  GI BLEED  GI bleeding  GI Bleeding  Other reaction(s): gi distress, Other (See Comments)  GI bleed  Other reaction(s): gi distress, Other (comments), Other (See Comments)  GI BLEED            GI bleeding    GI BLEED    GI Bleeding      GI  bleed  Other reaction(s): gi distress, Other (comments), Other (See Comments)  GI BLEED  GI BLEED  GI Bleeding  GI Bleeding    GI BLEED    GI bleed Other reaction(s): gi distress, Other (comments), Other (See Comments) GI BLEED      GI bleeding  GI BLEED  GI Bleeding   GI bleed Other reaction(s): gi distress, Other (comments), Other (See Comments) GI BLEED    Other reaction(s): gi distress, Other (See Comments) GI bleed Other reaction(s): gi distress, Other (comments), Other (See Comments) GI BLEED      GI bleeding  GI BLEED  GI Bleeding   GI bleed Other reaction(s): gi distress, Other (comments), Other (See Comments) GI BLEED GI BLEED GI Bleeding   Doxycycline Other (See Comments), Nausea And Vomiting and Shortness Of Breath    Unknown reaction.  Other reaction(s): Other (See Comments), Unknown, Unknown (comments)  Pt does not remember  Other reaction(s): Other (See Comments), Other (See Comments), unknown, Unknown (comments)  Unknown reaction.  Other reaction(s): Other (See Comments), Unknown, Unknown (comments)  Pt does not remember  Pt does not remember  Pt does not remember    Unknown reaction. Other reaction(s): Other (See Comments), Unknown, Unknown (comments) Pt does not remember    Other reaction(s): Other (See Comments), Other (See Comments), unknown, Unknown (comments) Unknown reaction. Other reaction(s): Other (See Comments), Unknown, Unknown (comments) Pt does not remember Pt does not remember   Gabapentin Nausea And Vomiting, Other (See Comments) and Shortness Of Breath    Drowsiness.  Like a zombie, not functional  Other reaction(s): Other (See Comments), Other (See Comments), Unknown, Unknown (comments)  Drowsiness.  Like a zombie, not functional  Drowsiness, lethargy  Drowsiness, lethargy    Drowsiness. Like a zombie, not functional    Other reaction(s): Other (See Comments), Other (See Comments), Unknown, Unknown (comments) Drowsiness. Like a zombie, not  functional Drowsiness, lethargy  Drowsiness, lethargy   Shellfish Allergy Anaphylaxis    Other reaction(s): anaphylaxis/angioedema  Betadine on skin is okay  Other reaction(s): anaphylaxis/angioedema Other reaction(s): anaphylaxis/angioedema  Betadine on skin is okay   Strawberry Extract Hives, Nausea And Vomiting, Nausea Only and Rash    Severe vomitting  Other Reaction(s): Skin Rashes, Hives   Tape Dermatitis    Patient stated Tegaderm after surgery as well as a clear tape tear off her top skin layer and leave her raw. Was told to notify team allergy to tape.    Tramadol Other (See Comments)    blacked out, found wandering in the street  Other Reaction(s): Not available, Other  Other Reaction(s): neurological reaction    confusion    Altered mental status, confusion    CONFUSION    blacked out, found wandering in the street  Other Reaction(s): neurological reaction, Not available, Other (See Comments)    blacked out, found wandering in the street    Altered mental status, confusion    CONFUSION    confusion    Other Reaction(s): neurological reaction  confusion  Altered mental status, confusion  CONFUSION  blacked out, found wandering in the street   Ibuprofen Other (See Comments)    Blood disorder, GI bleed.  Other reaction(s): gi distress, Other (comments), Other (See Comments)  Other Reaction(s): GI bleeding, Not available, Other  GI Bleeding    GI BLEED    Blood disorder, GI bleed. Other reaction(s): gi distress, Other (comments), Other (See Comments)  Other Reaction(s): Not available, Other (See Comments)    Blood disorder, GI bleed. Other reaction(s): gi distress, Other (comments), Other (See Comments)    GI BLEED    GI Bleeding    GI Bleeding  GI BLEED  Blood disorder, GI bleed. Other reaction(s): gi distress, Other (comments), Other (See Comments)   Montelukast Other (See Comments)  Syncope..passed out  Other Reaction(s):  Other  Caused Fainting    Passed out    Syncope..passed out  Other Reaction(s): Not available, other/intolerance, Syncope    Syncope..passed out    Passed out    Caused Fainting    Caused Fainting  Passed out  Syncope..passed out   Naproxen Other (See Comments)    GI bleed  Other reaction(s): Other (comments), Other (See Comments)  Other Reaction(s): GI Bleed, GI bleeding, Not available, Other  GI Bleeding    GI BLEED    GI bleed Other reaction(s): Other (comments), Other (See Comments)  Other Reaction(s): Other (See Comments)    GI bleed Other reaction(s): Other (comments), Other (See Comments)    GI BLEED    GI Bleeding    GI Bleeding  GI BLEED  GI bleed Other reaction(s): Other (comments), Other (See Comments)   Prednisone  Other (See Comments)    Tremors, severe  Other Reaction(s): Other (See Comments)  Other reaction(s): Other (See Comments), Other (See Comments)  Tremors, severe  Tremors and mental fogginess with oral prednisone , tolerates topical and inhaled  Tremors and mental fogginess with oral prednisone , tolerates topical and inhaled    Tremors, severe    Other reaction(s): Other (See Comments), Other (See Comments) Tremors, severe Tremors and mental fogginess with oral prednisone , tolerates topical and inhaled   Ascorbate Hives    HIVES / VOMITING   Methotrexate Derivatives Nausea And Vomiting   Sesame Oil     Other Reaction(s): Sesame Seed   Soy Allergy (Obsolete)     Other Reaction(s): Soybean   Egg-Derived Products Other (See Comments) and Rash    rash  Other reaction(s): Other (See Comments)  rash  rash    Other reaction(s): Other (See Comments) rash   Latex Itching and Rash    Other reaction(s): mild rash/itching  Other Reaction(s): Not available, Skin Rashes, Hives  Other reaction(s): rash/itching   Levothyroxine  Rash   Metronidazole Nausea Only    Fatigue  Pt states she gets extremely nauseas and weak.   Nsaids Nausea  And Vomiting and Other (See Comments)    Gastric bleed  Other Reaction(s): GI Bleed, Other  Other reaction(s): Other (See Comments), other/intolerance  Gastric bleed  Gastric bleed  Gastric bleed   Strawberry Flavoring Agent (Non-Screening) Hives   Sulfamethoxazole-Trimethoprim Other (See Comments)    Other reaction(s): Other (See Comments), other/intolerance  MAKES PAIN WORSE  Increases pain  Other Reaction(s): Other (See Comments)  Pt does not remember  Increases pain    Pt does not remember    MAKES PAIN WORSE    Other reaction(s): Other (See Comments), other/intolerance MAKES PAIN WORSE  Increases pain Increases pain    Review of Systems  Constitutional:  Positive for malaise/fatigue.  Musculoskeletal:  Positive for back pain, joint pain and neck pain.  All other systems reviewed and are negative.      Objective:   BP 136/82   Pulse 83   Ht 5' 1 (1.549 m)   Wt 152 lb 3.2 oz (69 kg)   SpO2 97%   BMI 28.76 kg/m   Vitals:   09/21/23 1035  BP: 136/82  Pulse: 83  Height: 5' 1 (1.549 m)  Weight: 152 lb 3.2 oz (69 kg)  SpO2: 97%  BMI (Calculated): 28.77    Physical Exam Vitals and nursing note reviewed.  Constitutional:      Appearance: Normal appearance. She is normal weight.  HENT:     Head: Normocephalic.  Eyes:  Extraocular Movements: Extraocular movements intact.     Conjunctiva/sclera: Conjunctivae normal.     Pupils: Pupils are equal, round, and reactive to light.  Cardiovascular:     Rate and Rhythm: Normal rate.  Pulmonary:     Effort: Pulmonary effort is normal.  Musculoskeletal:     Cervical back: Spasms and tenderness present. Decreased range of motion.     Thoracic back: Spasms and tenderness present. Decreased range of motion.     Lumbar back: Spasms and tenderness present. Decreased range of motion. Positive right straight leg raise test.  Neurological:     General: No focal deficit present.     Mental Status: She is alert  and oriented to person, place, and time. Mental status is at baseline.  Psychiatric:        Mood and Affect: Mood normal.        Behavior: Behavior normal.        Thought Content: Thought content normal.        Judgment: Judgment normal.      No results found for any visits on 09/21/23.  No results found for this or any previous visit (from the past 2160 hours).     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  I will send refill for short term until we are able to do so.      Return if symptoms worsen or fail to improve.   Total time spent: 20 minutes  ALAN CHRISTELLA ARRANT, FNP  09/21/2023   This document may have been prepared by [facility] Voice Recognition software and as such may include unintentional dictation errors.

## 2023-09-25 LAB — TOXASSURE SELECT 13 (MW), URINE

## 2023-09-27 ENCOUNTER — Encounter: Payer: Self-pay | Admitting: Family

## 2023-09-27 DIAGNOSIS — M542 Cervicalgia: Secondary | ICD-10-CM

## 2023-09-29 MED ORDER — OXYCODONE HCL 10 MG PO TABS: ORAL_TABLET | ORAL | 0 refills | Status: DC

## 2023-10-04 NOTE — Assessment & Plan Note (Signed)
 Pt to call with location for a pain clinic we can send her to.  I will send refill for short term until we are able to do so.

## 2023-11-02 ENCOUNTER — Telehealth: Payer: Self-pay

## 2023-11-02 ENCOUNTER — Other Ambulatory Visit: Payer: Self-pay

## 2023-11-02 DIAGNOSIS — M542 Cervicalgia: Secondary | ICD-10-CM

## 2023-11-02 NOTE — Telephone Encounter (Signed)
 Pt is requesting orders for kidney function to be sent in to be checked.

## 2023-11-03 ENCOUNTER — Other Ambulatory Visit: Payer: Self-pay

## 2023-11-03 DIAGNOSIS — I10 Essential (primary) hypertension: Secondary | ICD-10-CM

## 2023-11-03 MED ORDER — OXYCODONE HCL 10 MG PO TABS: ORAL_TABLET | ORAL | 0 refills | Status: DC

## 2023-11-14 ENCOUNTER — Encounter: Payer: Self-pay | Admitting: Family

## 2023-11-18 ENCOUNTER — Telehealth: Payer: Self-pay | Admitting: Family

## 2023-11-18 NOTE — Telephone Encounter (Signed)
 Patient left VM stating she went to urgent care on Monday and was diagnosed with pneumonia. They only gave her antibiotics. She is requesting Alan send her in something for her cough. Please advise.

## 2023-11-28 ENCOUNTER — Other Ambulatory Visit: Payer: Self-pay

## 2023-11-28 MED ORDER — BENZONATATE 100 MG PO CAPS: 100.0000 mg | ORAL_CAPSULE | Freq: Three times a day (TID) | ORAL | 0 refills | Status: AC | PRN

## 2023-12-13 ENCOUNTER — Ambulatory Visit: Admitting: Family

## 2023-12-13 ENCOUNTER — Encounter: Payer: Self-pay | Admitting: Family

## 2023-12-13 ENCOUNTER — Ambulatory Visit: Admission: RE | Admit: 2023-12-13 | Discharge: 2023-12-13 | Disposition: A | Attending: Family | Admitting: Family

## 2023-12-13 ENCOUNTER — Ambulatory Visit
Admission: RE | Admit: 2023-12-13 | Discharge: 2023-12-13 | Disposition: A | Source: Ambulatory Visit | Attending: Family | Admitting: Family

## 2023-12-13 VITALS — BP 124/83 | HR 78 | Ht 61.0 in | Wt 159.2 lb

## 2023-12-13 DIAGNOSIS — R7303 Prediabetes: Secondary | ICD-10-CM

## 2023-12-13 DIAGNOSIS — E782 Mixed hyperlipidemia: Secondary | ICD-10-CM

## 2023-12-13 DIAGNOSIS — E559 Vitamin D deficiency, unspecified: Secondary | ICD-10-CM

## 2023-12-13 DIAGNOSIS — R635 Abnormal weight gain: Secondary | ICD-10-CM

## 2023-12-13 DIAGNOSIS — E041 Nontoxic single thyroid nodule: Secondary | ICD-10-CM

## 2023-12-13 DIAGNOSIS — M542 Cervicalgia: Secondary | ICD-10-CM

## 2023-12-13 DIAGNOSIS — R5383 Other fatigue: Secondary | ICD-10-CM

## 2023-12-13 DIAGNOSIS — E538 Deficiency of other specified B group vitamins: Secondary | ICD-10-CM

## 2023-12-13 DIAGNOSIS — R052 Subacute cough: Secondary | ICD-10-CM

## 2023-12-13 MED ORDER — SODIUM CHLORIDE 0.9 % IN NEBU: 3.0000 mL | INHALATION_SOLUTION | Freq: Two times a day (BID) | RESPIRATORY_TRACT | 0 refills | Status: AC

## 2023-12-13 MED ORDER — ALBUTEROL SULFATE (2.5 MG/3ML) 0.083% IN NEBU: 2.5000 mg | INHALATION_SOLUTION | Freq: Four times a day (QID) | RESPIRATORY_TRACT | 2 refills | Status: AC | PRN

## 2023-12-13 NOTE — Progress Notes (Unsigned)
 Established Patient Office Visit  Subjective:  Patient ID: Carmen Grimes, female    DOB: [birth year]  Age: 63 y.o. MRN: [id number]  Chief Complaint  Patient presents with   Acute Visit    Persistent cough with mucus    HPI  No other concerns at this time.   Past Medical History:  Diagnosis Date   Anxiety state 10/18/2017   Asthma    controlled   Dyspnea 12/2017   difficulty getting her breath at all times   Esophageal reflux 06/26/2017   Formatting of this note might be different from the original.  06/23/18: UGI series - Mild GERD.   Former cigarette smoker 03/09/2018   GERD (gastroesophageal reflux disease)    H. pylori infection 06/19/2018   Formatting of this note might be different from the original.  06/18/08 - H pylori breath test +. S/p amoxicillin  + clarithromycin + omeprazole BID x 10 days. Omeprazole BID continued x 2 months for empiric treatment of possible gastritis.   H/O chest tube placement    H/O: GI bleed    Hiatal hernia    History of bronchoscopy 03/30/2018   History of thoracic surgery 03/30/2018   Hypertension    Lung mass    Mitral valve prolapse    Osteoarthritis    knees   Other nonspecific abnormal finding of lung field 11/18/2017   Other pneumothorax 12/12/2017   Stomach ulcer     Past Surgical History:  Procedure Laterality Date   ABDOMINAL HYSTERECTOMY  2002   ANTERIOR FUSION CERVICAL SPINE  2009   BREAST CYST ASPIRATION Right    COLONOSCOPY  2013   normal   ELECTROMAGNETIC NAVIGATION BROCHOSCOPY Left 12/12/2017   Procedure: ELECTROMAGNETIC NAVIGATION BRONCHOSCOPY;  Surgeon: Isaiah Scrivener, MD;  Location: [facility];  Service: Cardiopulmonary;  Laterality: Left;   ESOPHAGOGASTRODUODENOSCOPY  1990   GI bleed   FLEXIBLE BRONCHOSCOPY Left 12/12/2017   Procedure: FLEXIBLE BRONCHOSCOPY;  Surgeon: Isaiah Scrivener, MD;  Location: [facility];  Service: Cardiopulmonary;  Laterality: Left;   KNEE ARTHROSCOPY Right 2010   x3   KNEE SURGERY Left     Arthroscopy x1   LAPAROSCOPIC SUPRACERVICAL HYSTERECTOMY  2002   with BSO for fibroids/ AUB/ pelvic pain   LAPAROSCOPIC TUBAL LIGATION  1982   LUNG BIOPSY     SHOULDER SURGERY Right    rotator cuff repair   SHOULDER SURGERY Right 2011   second rotator cuff repair   tubal reanastamosis  1985    Social History   Socioeconomic History   Marital status: Single    Spouse name: Not on file   Number of children: 3   Years of education: Not on file   Highest education level: Not on file  Occupational History   Occupation: CMA and phlebotomist    Comment: not working but would like to  Tobacco Use   Smoking status: Former    Current packs/day: 1.00    Average packs/day: 1 pack/day for 13.0 years (13.0 ttl pk-yrs)    Types: Cigarettes   Smokeless tobacco: Never  Vaping Use   Vaping status: Never Used  Substance and Sexual Activity   Alcohol use: Not Currently   Drug use: No   Sexual activity: Not Currently    Birth control/protection: Post-menopausal  Other Topics Concern   Not on file  Social History Narrative   Independent at baseline.  Lives at home by herself   Social Drivers of Health   Financial Resource Strain: Not on file  Food Insecurity: Not on file  Transportation Needs: Not on file  Physical Activity: Sufficiently Active (01/24/2017)   Exercise Vital Sign    Days of Exercise per Week: 5 days    Minutes of Exercise per Session: 50 min  Stress: No Stress Concern Present (01/24/2017)   [employer] of Occupational Health - Occupational Stress Questionnaire    Feeling of Stress : Not at all  Social Connections: Somewhat Isolated (01/24/2017)   Social Connection and Isolation Panel    Frequency of Communication with Friends and Family: More than three times a week    Frequency of Social Gatherings with Friends and Family: More than three times a week    Attends Religious Services: More than 4 times per year    Active Member of [employer] or Organizations: No     Attends Banker Meetings: Never    Marital Status: Divorced  Catering Manager Violence: Not At Risk (01/24/2017)   Humiliation, Afraid, Rape, and Kick questionnaire    Fear of Current or Ex-Partner: No    Emotionally Abused: No    Physically Abused: No    Sexually Abused: No    Family History  Problem Relation Age of Onset   Diabetes Mother    Dementia Mother        Alzheimers   Hypertension Mother    Glaucoma Mother    Prostate cancer Father 60   Stroke Father    Arthritis Father    Osteoporosis Father    Prostate cancer Brother    Breast cancer Sister 72   Uterine cancer Paternal Aunt        1 of 4 paternal aunts   Ovarian cancer Maternal Aunt        1 of 5 maternal aunts    Allergies  Allergen Reactions   Ascorbic Acid Hives and Nausea And Vomiting    HIVES / VOMITING   Celecoxib Other (See Comments), Nausea And Vomiting and Shortness Of Breath    GI bleed  Other reaction(s): gi distress, Other (comments), Other (See Comments)  GI BLEED  GI bleeding  GI Bleeding  Other reaction(s): gi distress, Other (See Comments)  GI bleed  Other reaction(s): gi distress, Other (comments), Other (See Comments)  GI BLEED            GI bleeding    GI BLEED    GI Bleeding      GI bleed  Other reaction(s): gi distress, Other (comments), Other (See Comments)  GI BLEED  GI BLEED  GI Bleeding  GI Bleeding    GI BLEED    GI bleed Other reaction(s): gi distress, Other (comments), Other (See Comments) GI BLEED      GI bleeding  GI BLEED  GI Bleeding   GI bleed Other reaction(s): gi distress, Other (comments), Other (See Comments) GI BLEED    Other reaction(s): gi distress, Other (See Comments) GI bleed Other reaction(s): gi distress, Other (comments), Other (See Comments) GI BLEED      GI bleeding  GI BLEED  GI Bleeding   GI bleed Other reaction(s): gi distress, Other (comments), Other (See Comments) GI BLEED GI BLEED GI Bleeding   Doxycycline Other (See  Comments), Nausea And Vomiting and Shortness Of Breath    Unknown reaction.  Other reaction(s): Other (See Comments), Unknown, Unknown (comments)  Pt does not remember  Other reaction(s): Other (See Comments), Other (See Comments), unknown, Unknown (comments)  Unknown reaction.  Other reaction(s): Other (See Comments), Unknown, Unknown (comments)  Pt does not remember  Pt does not remember  Pt does not remember    Unknown reaction. Other reaction(s): Other (See Comments), Unknown, Unknown (comments) Pt does not remember    Other reaction(s): Other (See Comments), Other (See Comments), unknown, Unknown (comments) Unknown reaction. Other reaction(s): Other (See Comments), Unknown, Unknown (comments) Pt does not remember Pt does not remember   Gabapentin Nausea And Vomiting, Other (See Comments) and Shortness Of Breath    Drowsiness.  Like a zombie, not functional  Other reaction(s): Other (See Comments), Other (See Comments), Unknown, Unknown (comments)  Drowsiness.  Like a zombie, not functional  Drowsiness, lethargy  Drowsiness, lethargy    Drowsiness. Like a zombie, not functional    Other reaction(s): Other (See Comments), Other (See Comments), Unknown, Unknown (comments) Drowsiness. Like a zombie, not functional Drowsiness, lethargy  Drowsiness, lethargy   Shellfish Allergy Anaphylaxis    Other reaction(s): anaphylaxis/angioedema  Betadine on skin is okay  Other reaction(s): anaphylaxis/angioedema Other reaction(s): anaphylaxis/angioedema  Betadine on skin is okay   Strawberry Extract Hives, Nausea And Vomiting, Nausea Only and Rash    Severe vomitting  Other Reaction(s): Skin Rashes, Hives   Tape Dermatitis    Patient stated Tegaderm after surgery as well as a clear tape tear off her top skin layer and leave her raw. Was told to notify team allergy to tape.    Tramadol Other (See Comments)    blacked out, found wandering in the street  Other Reaction(s): Not  available, Other  Other Reaction(s): neurological reaction    confusion    Altered mental status, confusion    CONFUSION    blacked out, found wandering in the street  Other Reaction(s): neurological reaction, Not available, Other (See Comments)    blacked out, found wandering in the street    Altered mental status, confusion    CONFUSION    confusion    Other Reaction(s): neurological reaction  confusion  Altered mental status, confusion  CONFUSION  blacked out, found wandering in the street  confusion    Altered mental status, confusion    blacked out, found wandering in the street  Other Reaction(s): Not available, Other  Other Reaction(s): neurological reaction  confusion  Altered mental status, confusion  CONFUSION  blacked out, found wandering in the street  Other Reaction(s): neurological reaction, Not available, Other (See Comments)  blacked out, found wandering in the street  Altered mental status, confusion  CONFUSION  confusion  Other Reaction(s): neurological reaction  confusion  Altered mental status, confusion  CONFUSION  blacked out, found wandering in the street    Other Reaction(s): neurological reaction  confusion  Altered mental status, confusion  CONFUSION  blacked out, found wandering in the street   Ibuprofen Other (See Comments)    Blood disorder, GI bleed.  Other reaction(s): gi distress, Other (comments), Other (See Comments)  Other Reaction(s): GI bleeding, Not available, Other  GI Bleeding    GI BLEED    Blood disorder, GI bleed. Other reaction(s): gi distress, Other (comments), Other (See Comments)  Other Reaction(s): Not available, Other (See Comments)    Blood disorder, GI bleed. Other reaction(s): gi distress, Other (comments), Other (See Comments)    GI BLEED    GI Bleeding    GI Bleeding  GI BLEED  Blood disorder, GI bleed. Other reaction(s): gi distress, Other (comments), Other (See Comments)   Montelukast  Other (See Comments)    Syncope..passed out  Other Reaction(s): Other  Caused Fainting  Passed out    Syncope..passed out  Other Reaction(s): Not available, other/intolerance, Syncope    Syncope..passed out    Passed out    Caused Fainting    Caused Fainting  Passed out  Syncope..passed out  Caused Fainting    Syncope..passed out  Other Reaction(s): Other  Caused Fainting  Passed out  Syncope..passed out  Other Reaction(s): Not available, other/intolerance, Syncope  Syncope..passed out  Passed out  Caused Fainting  Caused Fainting  Passed out  Syncope..passed out    Caused Fainting  Passed out  Syncope..passed out   Naproxen Other (See Comments)    GI bleed  Other reaction(s): Other (comments), Other (See Comments)  Other Reaction(s): GI Bleed, GI bleeding, Not available, Other  GI Bleeding    GI BLEED    GI bleed Other reaction(s): Other (comments), Other (See Comments)  Other Reaction(s): Other (See Comments)    GI bleed Other reaction(s): Other (comments), Other (See Comments)    GI BLEED    GI Bleeding    GI Bleeding  GI BLEED  GI bleed Other reaction(s): Other (comments), Other (See Comments)   Prednisone  Other (See Comments)    Tremors, severe  Other Reaction(s): Other (See Comments)  Other reaction(s): Other (See Comments), Other (See Comments)  Tremors, severe  Tremors and mental fogginess with oral prednisone , tolerates topical and inhaled  Tremors and mental fogginess with oral prednisone , tolerates topical and inhaled    Tremors, severe    Other reaction(s): Other (See Comments), Other (See Comments) Tremors, severe Tremors and mental fogginess with oral prednisone , tolerates topical and inhaled  Tremors and mental fogginess with oral prednisone , tolerates topical and inhaled    Tremors, severe  Other Reaction(s): Other (See Comments)  Other reaction(s): Other (See Comments), Other (See Comments) Tremors, severe Tremors and mental  fogginess with oral prednisone , tolerates topical and inhaled  Tremors and mental fogginess with oral prednisone , tolerates topical and inhaled  Tremors, severe  Other reaction(s): Other (See Comments), Other (See Comments) Tremors, severe Tremors and mental fogginess with oral prednisone , tolerates topical and inhaled    Tremors and mental fogginess with oral prednisone , tolerates topical and inhaled  Tremors, severe  Other reaction(s): Other (See Comments), Other (See Comments) Tremors, severe Tremors and mental fogginess with oral prednisone , tolerates topical and inhaled   Ascorbate Hives    HIVES / VOMITING   Methotrexate And Trimetrexate Nausea And Vomiting   Sesame Oil     Other Reaction(s): Sesame Seed   Soy Allergy (Obsolete)     Other Reaction(s): Soybean   Egg Protein-Containing Drug Products Other (See Comments) and Rash    rash  Other reaction(s): Other (See Comments)  rash  rash    Other reaction(s): Other (See Comments) rash   Latex Itching and Rash    Other reaction(s): mild rash/itching  Other Reaction(s): Not available, Skin Rashes, Hives  Other reaction(s): rash/itching   Levothyroxine  Rash   Metronidazole Nausea Only    Fatigue  Pt states she gets extremely nauseas and weak.   Nsaids Nausea And Vomiting and Other (See Comments)    Gastric bleed  Other Reaction(s): GI Bleed, Other  Other reaction(s): Other (See Comments), other/intolerance  Gastric bleed  Gastric bleed  Gastric bleed  Gastric bleed  Other Reaction(s): GI Bleed, Other  Other reaction(s): Other (See Comments), other/intolerance Gastric bleed Gastric bleed Gastric bleed   Strawberry Flavoring Agent (Non-Screening) Hives   Sulfamethoxazole-Trimethoprim Other (See Comments)    Other reaction(s): Other (See Comments), other/intolerance  MAKES PAIN WORSE  Increases pain  Other Reaction(s): Other (See Comments)  Pt does not remember  Increases pain    Pt does not remember    MAKES  PAIN WORSE    Other reaction(s): Other (See Comments), other/intolerance MAKES PAIN WORSE  Increases pain Increases pain  Increases pain    Pt does not remember    Other reaction(s): Other (See Comments), other/intolerance  MAKES PAIN WORSE  Increases pain  Other Reaction(s): Other (See Comments)  Pt does not remember  Increases pain  Pt does not remember  MAKES PAIN WORSE  Other reaction(s): Other (See Comments), other/intolerance MAKES PAIN WORSE  Increases pain Increases pain    Increases pain  Pt does not remember  MAKES PAIN WORSE  Other reaction(s): Other (See Comments), other/intolerance MAKES PAIN WORSE  Increases pain Increases pain    Review of Systems  Respiratory:  Positive for cough and sputum production.   Musculoskeletal:  Positive for back pain, joint pain, myalgias and neck pain.  All other systems reviewed and are negative.      Objective:   BP 124/83   Pulse 78   Ht 5' 1 (1.549 m)   Wt 159 lb 3.2 oz (72.2 kg)   SpO2 96%   BMI 30.08 kg/m   Vitals:   12/13/23 1023  BP: 124/83  Pulse: 78  Height: 5' 1 (1.549 m)  Weight: 159 lb 3.2 oz (72.2 kg)  SpO2: 96%  BMI (Calculated): 30.1    Physical Exam Vitals and nursing note reviewed.  Constitutional:      Appearance: Normal appearance. She is normal weight.  HENT:     Head: Normocephalic and atraumatic.  Eyes:     Extraocular Movements: Extraocular movements intact.     Conjunctiva/sclera: Conjunctivae normal.     Pupils: Pupils are equal, round, and reactive to light.  Cardiovascular:     Rate and Rhythm: Normal rate.  Pulmonary:     Effort: Pulmonary effort is normal.  Neurological:     General: No focal deficit present.     Mental Status: She is alert and oriented to person, place, and time. Mental status is at baseline.  Psychiatric:        Mood and Affect: Mood normal.        Behavior: Behavior normal.        Thought Content: Thought content normal.        Judgment: Judgment normal.       No results found for any visits on 12/13/23.  Recent Results (from the past 2160 hours)  [employer]ox[facility] Select 13 (MW), Urine     Status: None   Collection Time: 09/21/23 11:33 AM  Result Value Ref Range   Summary FINAL     Comment: ==================================================================== [employer] Select 13 (MW) ==================================================================== Test                             Result       Flag       Units    NO DRUGS DETECTED. ==================================================================== Test                      Result    Flag   Units      Ref Range   Creatinine              139              mg/dL      >=  20 ==================================================================== Declared Medications:  Medication list was not provided. ==================================================================== For clinical consultation, please call [phone]. ====================================================================        Assessment & Plan:   Assessment & Plan     No follow-ups on file.   Total time spent: {AMA time spent:29001} minutes  ALAN CHRISTELLA ARRANT, FNP  12/13/2023   This document may have been prepared by [facility] Voice Recognition software and as such may include unintentional dictation errors.

## 2023-12-14 ENCOUNTER — Encounter: Payer: Self-pay | Admitting: Family

## 2023-12-14 MED ORDER — OXYCODONE HCL 10 MG PO TABS: ORAL_TABLET | ORAL | 0 refills | Status: AC

## 2023-12-14 NOTE — Assessment & Plan Note (Signed)
 Patient has been doing well with her current dosing, asks if we can send a refill.   I am also sending referral to a different pain clinic in her area to get her set up for management there.

## 2023-12-14 NOTE — Assessment & Plan Note (Signed)
 Checking labs today.  Continue current therapy for lipid control. Will modify as needed based on labwork results.   -CMP w/eGFR -Lipid Panel

## 2023-12-14 NOTE — Assessment & Plan Note (Signed)
 Checking labs today.  Will continue supplements as needed.   - Vitamin D  - Vitamin B12 - TSH

## 2023-12-15 ENCOUNTER — Encounter: Payer: Self-pay | Admitting: Internal Medicine

## 2023-12-19 ENCOUNTER — Ambulatory Visit: Payer: Self-pay | Admitting: Family

## 2023-12-19 NOTE — Progress Notes (Signed)
 Left vm to return call.

## 2023-12-20 NOTE — Progress Notes (Signed)
 Left vm to return call.

## 2023-12-28 NOTE — Telephone Encounter (Signed)
 Per Mylinda Asa already addressed

## 2024-01-19 ENCOUNTER — Other Ambulatory Visit: Payer: Self-pay

## 2024-01-19 DIAGNOSIS — J989 Respiratory disorder, unspecified: Secondary | ICD-10-CM

## 2024-01-19 MED ORDER — HYDROCOD POLI-CHLORPHE POLI ER 10-8 MG/5ML PO SUER: 5.0000 mL | Freq: Two times a day (BID) | ORAL | 0 refills | Status: DC | PRN

## 2024-01-19 NOTE — Telephone Encounter (Signed)
 MRI has been pended and so has tussenox per your verbal

## 2024-01-21 LAB — CBC WITH DIFFERENTIAL/PLATELET
Basophils Absolute: 0 x10E3/uL (ref 0.0–0.2)
Basos: 0 %
EOS (ABSOLUTE): 0.3 x10E3/uL (ref 0.0–0.4)
Eos: 4 %
Hematocrit: 39.3 % (ref 34.0–46.6)
Hemoglobin: 12.9 g/dL (ref 11.1–15.9)
Immature Grans (Abs): 0 x10E3/uL (ref 0.0–0.1)
Immature Granulocytes: 0 %
Lymphocytes Absolute: 2.3 x10E3/uL (ref 0.7–3.1)
Lymphs: 33 %
MCH: 30.9 pg (ref 26.6–33.0)
MCHC: 32.8 g/dL (ref 31.5–35.7)
MCV: 94 fL (ref 79–97)
Monocytes Absolute: 0.5 x10E3/uL (ref 0.1–0.9)
Monocytes: 7 %
Neutrophils Absolute: 3.9 x10E3/uL (ref 1.4–7.0)
Neutrophils: 56 %
Platelets: 308 x10E3/uL (ref 150–450)
RBC: 4.18 x10E6/uL (ref 3.77–5.28)
RDW: 12.9 % (ref 11.7–15.4)
WBC: 7 x10E3/uL (ref 3.4–10.8)

## 2024-01-21 LAB — HEMOGLOBIN A1C
Est. average glucose Bld gHb Est-mCnc: 100 mg/dL
Hgb A1c MFr Bld: 5.1 % (ref 4.8–5.6)

## 2024-01-21 LAB — CMP14+EGFR
ALT: 16 IU/L (ref 0–32)
AST: 22 IU/L (ref 0–40)
Albumin: 4.7 g/dL (ref 3.9–4.9)
Alkaline Phosphatase: 85 IU/L (ref 49–135)
BUN/Creatinine Ratio: 14 (ref 12–28)
BUN: 12 mg/dL (ref 8–27)
Bilirubin Total: 0.5 mg/dL (ref 0.0–1.2)
CO2: 24 mmol/L (ref 20–29)
Calcium: 10.1 mg/dL (ref 8.7–10.3)
Chloride: 105 mmol/L (ref 96–106)
Creatinine, Ser: 0.86 mg/dL (ref 0.57–1.00)
Globulin, Total: 2.2 g/dL (ref 1.5–4.5)
Glucose: 85 mg/dL (ref 70–99)
Potassium: 4.9 mmol/L (ref 3.5–5.2)
Sodium: 141 mmol/L (ref 134–144)
Total Protein: 6.9 g/dL (ref 6.0–8.5)
eGFR: 74 mL/min/1.73

## 2024-01-21 LAB — IRON,TIBC AND FERRITIN PANEL
Ferritin: 95 ng/mL (ref 15–150)
Iron Saturation: 24 % (ref 15–55)
Iron: 80 ug/dL (ref 27–139)
Total Iron Binding Capacity: 330 ug/dL (ref 250–450)
UIBC: 250 ug/dL (ref 118–369)

## 2024-01-21 LAB — TSH+T4F+T3FREE
Free T4: 0.93 ng/dL (ref 0.82–1.77)
T3, Free: 2.6 pg/mL (ref 2.0–4.4)
TSH: 1.08 u[IU]/mL (ref 0.450–4.500)

## 2024-01-21 LAB — LIPID PANEL
Chol/HDL Ratio: 2.7 ratio (ref 0.0–4.4)
Cholesterol, Total: 251 mg/dL — ABNORMAL HIGH (ref 100–199)
HDL: 93 mg/dL
LDL Chol Calc (NIH): 142 mg/dL — ABNORMAL HIGH (ref 0–99)
Triglycerides: 93 mg/dL (ref 0–149)
VLDL Cholesterol Cal: 16 mg/dL (ref 5–40)

## 2024-01-21 LAB — VITAMIN B12: Vitamin B-12: 439 pg/mL (ref 232–1245)

## 2024-01-21 LAB — VITAMIN D 25 HYDROXY (VIT D DEFICIENCY, FRACTURES): Vit D, 25-Hydroxy: 27.2 ng/mL — ABNORMAL LOW (ref 30.0–100.0)

## 2024-01-25 ENCOUNTER — Ambulatory Visit: Admitting: Family

## 2024-02-01 ENCOUNTER — Ambulatory Visit: Admitting: Family

## 2024-02-01 ENCOUNTER — Telehealth: Payer: Self-pay

## 2024-02-03 ENCOUNTER — Encounter: Payer: Self-pay | Admitting: Family

## 2024-02-03 ENCOUNTER — Ambulatory Visit: Admitting: Family

## 2024-02-03 VITALS — BP 126/70 | HR 68 | Ht 61.0 in | Wt 152.6 lb

## 2024-02-03 DIAGNOSIS — L439 Lichen planus, unspecified: Secondary | ICD-10-CM

## 2024-02-03 MED ORDER — ALPRAZOLAM 0.25 MG PO TABS: ORAL_TABLET | ORAL | 0 refills | Status: AC

## 2024-02-03 MED ORDER — HYDROXYZINE HCL 10 MG PO TABS: 10.0000 mg | ORAL_TABLET | Freq: Three times a day (TID) | ORAL | 0 refills | Status: AC | PRN

## 2024-02-03 MED ORDER — IPRATROPIUM BROMIDE 0.03 % NA SOLN: 2.0000 | Freq: Three times a day (TID) | NASAL | 2 refills | Status: AC

## 2024-02-03 MED ORDER — CLOBETASOL PROPIONATE 0.05 % EX CREA: TOPICAL_CREAM | CUTANEOUS | 0 refills | Status: AC

## 2024-02-03 MED ORDER — VENTOLIN HFA 108 (90 BASE) MCG/ACT IN AERS: 2.0000 | INHALATION_SPRAY | Freq: Four times a day (QID) | RESPIRATORY_TRACT | 6 refills | Status: AC | PRN

## 2024-02-03 NOTE — Progress Notes (Unsigned)
 "  Established Patient Office Visit  Subjective:  Patient ID: Carmen Grimes, female    DOB: [birth year]  Age: 64 y.o. MRN: [id number]  Chief Complaint  Patient presents with   Cough   Rash    1-2 times daily for first 2 weeks then, once daily for next 2-4 weeks then, taper use to 2-3 times weekly   Cough The cough is Productive of sputum. Associated symptoms include a rash.  Rash Associated symptoms include coughing. Nail changes: clobetasol .   No other concerns at this time.   Past Medical History:  Diagnosis Date   Anxiety state 10/18/2017   Asthma    controlled   Dyspnea 12/2017   difficulty getting her breath at all times   Esophageal reflux 06/26/2017   Formatting of this note might be different from the original.  06/23/18: UGI series - Mild GERD.   Former cigarette smoker 03/09/2018   GERD (gastroesophageal reflux disease)    H. pylori infection 06/19/2018   Formatting of this note might be different from the original.  06/18/08 - H pylori breath test +. S/p amoxicillin  + clarithromycin + omeprazole BID x 10 days. Omeprazole BID continued x 2 months for empiric treatment of possible gastritis.   H/O chest tube placement    H/O: GI bleed    Hiatal hernia    History of bronchoscopy 03/30/2018   History of thoracic surgery 03/30/2018   Hypertension    Lung mass    Mitral valve prolapse    Osteoarthritis    knees   Other nonspecific abnormal finding of lung field 11/18/2017   Other pneumothorax 12/12/2017   Stomach ulcer     Past Surgical History:  Procedure Laterality Date   ABDOMINAL HYSTERECTOMY  2002   ANTERIOR FUSION CERVICAL SPINE  2009   BREAST CYST ASPIRATION Right    COLONOSCOPY  2013   normal   ELECTROMAGNETIC NAVIGATION BROCHOSCOPY Left 12/12/2017   Procedure: ELECTROMAGNETIC NAVIGATION BRONCHOSCOPY;  Surgeon: Isaiah Scrivener, MD;  Location: [facility];  Service: Cardiopulmonary;  Laterality: Left;   ESOPHAGOGASTRODUODENOSCOPY   1990   GI bleed   FLEXIBLE BRONCHOSCOPY Left 12/12/2017   Procedure: FLEXIBLE BRONCHOSCOPY;  Surgeon: Isaiah Scrivener, MD;  Location: [facility];  Service: Cardiopulmonary;  Laterality: Left;   KNEE ARTHROSCOPY Right 2010   x3   KNEE SURGERY Left    Arthroscopy x1   LAPAROSCOPIC SUPRACERVICAL HYSTERECTOMY  2002   with BSO for fibroids/ AUB/ pelvic pain   LAPAROSCOPIC TUBAL LIGATION  1982   LUNG BIOPSY     SHOULDER SURGERY Right    rotator cuff repair   SHOULDER SURGERY Right 2011   second rotator cuff repair   tubal reanastamosis  1985    Social History   Socioeconomic History   Marital status: Single    Spouse name: Not on file   Number of children: 3   Years of education: Not on file   Highest education level: Not on file  Occupational History   Occupation: CMA and phlebotomist    Comment: not working but would like to  Tobacco Use   Smoking status: Former    Current packs/day: 1.00    Average packs/day: 1 pack/day for 13.0 years (13.0 ttl pk-yrs)    Types: Cigarettes   Smokeless tobacco: Never  Vaping Use   Vaping status: Never Used  Substance and Sexual Activity   Alcohol use: Not Currently   Drug use: No   Sexual activity: Not Currently    Birth control/protection:  Post-menopausal  Other Topics Concern   Not on file  Social History Narrative   Independent at baseline.  Lives at home by herself   Social Drivers of Health   Tobacco Use: Medium Risk (02/03/2024)   Patient History    Smoking Tobacco Use: Former    Smokeless Tobacco Use: Never    Passive Exposure: Not on Actuary Strain: Not on file  Food Insecurity: Not on file  Transportation Needs: Not on file  Physical Activity: Not on file  Stress: Not on file  Social Connections: Not on file  Intimate Partner Violence: Not on file  Depression (PHQ2-9): Low Risk (10/19/2022)   Depression (PHQ2-9)    PHQ-2 Score: 4  Alcohol Screen: Not on file  Housing: Unknown  (01/17/2023)   Received from [facility]   Epic    Unable to Pay for Housing in the Last Year: Not on file    Number of Times Moved in the Last Year: Not on file    At any time in the past 12 months, were you homeless or living in a shelter (including now)?: No  Utilities: Not on file  Health Literacy: Not on file    Family History  Problem Relation Age of Onset   Diabetes Mother    Dementia Mother        Alzheimers   Hypertension Mother    Glaucoma Mother    Prostate cancer Father 68   Stroke Father    Arthritis Father    Osteoporosis Father    Prostate cancer Brother    Breast cancer Sister 46   Uterine cancer Paternal Aunt        1 of 4 paternal aunts   Ovarian cancer Maternal Aunt        1 of 5 maternal aunts    Allergies[1]  Review of Systems  Respiratory:  Positive for cough.   Skin:  Positive for rash. Nail changes: clobetasol . All other systems reviewed and are negative.      Objective:   BP 126/70   Pulse 68   Ht 5' 1 (1.549 m)   Wt 152 lb 9.6 oz (69.2 kg)   SpO2 99%   BMI 28.83 kg/m   Vitals:   02/03/24 1051  BP: 126/70  Pulse: 68  Height: 5' 1 (1.549 m)  Weight: 152 lb 9.6 oz (69.2 kg)  SpO2: 99%  BMI (Calculated): 28.85    Physical Exam Vitals and nursing note reviewed.  Constitutional:      Appearance: Normal appearance. She is normal weight.  HENT:     Head: Normocephalic.  Eyes:     Extraocular Movements: Extraocular movements intact.     Conjunctiva/sclera: Conjunctivae normal.     Pupils: Pupils are equal, round, and reactive to light.  Cardiovascular:     Rate and Rhythm: Normal rate.  Pulmonary:     Effort: Pulmonary effort is normal.  Neurological:     General: No focal deficit present.     Mental Status: She is alert and oriented to person, place, and time. Mental status is at baseline.  Psychiatric:        Mood and Affect: Mood normal.        Behavior: Behavior normal.        Thought  Content: Thought content normal.      No results found for any visits on 02/03/24.  Recent Results (from the past 2160 hours)  CMP14+EGFR     Status:  None   Collection Time: 01/20/24 12:43 PM  Result Value Ref Range   Glucose 85 70 - 99 mg/dL   BUN 12 8 - 27 mg/dL   Creatinine, Ser 9.13 0.57 - 1.00 mg/dL   eGFR 74 >40 fO/fpw/8.26   BUN/Creatinine Ratio 14 12 - 28   Sodium 141 134 - 144 mmol/L   Potassium 4.9 3.5 - 5.2 mmol/L   Chloride 105 96 - 106 mmol/L   CO2 24 20 - 29 mmol/L   Calcium  10.1 8.7 - 10.3 mg/dL   Total Protein 6.9 6.0 - 8.5 g/dL   Albumin 4.7 3.9 - 4.9 g/dL   Globulin, Total 2.2 1.5 - 4.5 g/dL   Bilirubin Total 0.5 0.0 - 1.2 mg/dL   Alkaline Phosphatase 85 49 - 135 IU/L   AST 22 0 - 40 IU/L   ALT 16 0 - 32 IU/L  Lipid panel     Status: Abnormal   Collection Time: 01/20/24 12:43 PM  Result Value Ref Range   Cholesterol, Total 251 (H) 100 - 199 mg/dL   Triglycerides 93 0 - 149 mg/dL   HDL 93 >60 mg/dL   VLDL Cholesterol Cal 16 5 - 40 mg/dL   LDL Chol Calc (NIH) 857 (H) 0 - 99 mg/dL   Chol/HDL Ratio 2.7 0.0 - 4.4 ratio    Comment:                                   T. Chol/HDL Ratio                                             Men  Women                               1/2 Avg.Risk  3.4    3.3                                   Avg.Risk  5.0    4.4                                2X Avg.Risk  9.6    7.1                                3X Avg.Risk 23.4   11.0   Iron, TIBC and Ferritin Panel     Status: None   Collection Time: 01/20/24 12:43 PM  Result Value Ref Range   Total Iron Binding Capacity 330 250 - 450 ug/dL   UIBC 749 881 - 630 ug/dL   Iron 80 27 - 860 ug/dL   Iron Saturation 24 15 - 55 %   Ferritin 95 15 - 150 ng/mL  VITAMIN D  25 Hydroxy (Vit-D Deficiency, Fractures)     Status: Abnormal   Collection Time: 01/20/24 12:43 PM  Result Value Ref Range   Vit D, 25-Hydroxy 27.2 (L) 30.0 - 100.0 ng/mL    Comment: Vitamin D  deficiency has been defined by  the Institute of Medicine and an Endocrine Society practice guideline as  a level of serum 25-OH vitamin D  less than 20 ng/mL (1,2). The Endocrine Society went on to further define vitamin D  insufficiency as a level between 21 and 29 ng/mL (2). 1. IOM (Institute of Medicine). 2010. Dietary reference    intakes for calcium  and D. [place]: The    [employer]. 2. Holick MF, Binkley [place], Bischoff-Ferrari HA, et al.    Evaluation, treatment, and prevention of vitamin D     deficiency: an Endocrine Society clinical practice    guideline. JCEM. 2011 Jul; 96(7):1911-30.   Vitamin B12     Status: None   Collection Time: 01/20/24 12:43 PM  Result Value Ref Range   Vitamin B-12 439 232 - 1,245 pg/mL  CBC with Diff     Status: None   Collection Time: 01/20/24 12:43 PM  Result Value Ref Range   WBC 7.0 3.4 - 10.8 x10E3/uL   RBC 4.18 3.77 - 5.28 x10E6/uL   Hemoglobin 12.9 11.1 - 15.9 g/dL   Hematocrit 60.6 65.9 - 46.6 %   MCV 94 79 - 97 fL   MCH 30.9 26.6 - 33.0 pg   MCHC 32.8 31.5 - 35.7 g/dL   RDW 87.0 88.2 - 84.5 %   Platelets 308 150 - 450 x10E3/uL   Neutrophils 56 Not Estab. %   Lymphs 33 Not Estab. %   Monocytes 7 Not Estab. %   Eos 4 Not Estab. %   Basos 0 Not Estab. %   Neutrophils Absolute 3.9 1.4 - 7.0 x10E3/uL   Lymphocytes Absolute 2.3 0.7 - 3.1 x10E3/uL   Monocytes Absolute 0.5 0.1 - 0.9 x10E3/uL   EOS (ABSOLUTE) 0.3 0.0 - 0.4 x10E3/uL   Basophils Absolute 0.0 0.0 - 0.2 x10E3/uL   Immature Granulocytes 0 Not Estab. %   Immature Grans (Abs) 0.0 0.0 - 0.1 x10E3/uL  Hemoglobin A1c     Status: None   Collection Time: 01/20/24 12:43 PM  Result Value Ref Range   Hgb A1c MFr Bld 5.1 4.8 - 5.6 %    Comment:          Prediabetes: 5.7 - 6.4          Diabetes: >6.4          Glycemic control for adults with diabetes: <7.0    Est. average glucose Bld gHb Est-mCnc 100 mg/dL  UDY+[id number]     Status: None   Collection Time: 01/20/24 12:43 PM  Result Value Ref  Range   TSH 1.080 0.450 - 4.500 uIU/mL   T3, Free 2.6 2.0 - 4.4 pg/mL   Free T4 0.93 0.82 - 1.77 ng/dL       Assessment & Plan:   Assessment & Plan Lichen planus     No follow-ups on file.   Total time spent: {AMA time spent:29001} minutes  ALAN CHRISTELLA ARRANT, FNP  02/03/2024   This document may have been prepared by [facility] Voice Recognition software and as such may include unintentional dictation errors.       [1] Allergies Allergen Reactions   Ascorbic Acid Hives and Nausea And Vomiting    HIVES / VOMITING   Celecoxib Shortness Of Breath, Nausea And Vomiting and Other (See Comments)      GI Bleed                    Doxycycline Other (See Comments), Nausea And Vomiting and Shortness Of Breath    Unknown reaction.  Other reaction(s): Other (See Comments), Unknown, Unknown (comments)  Pt does not remember  Other reaction(s): Other (See Comments), Other (See Comments), unknown, Unknown (comments)  Unknown reaction.  Other reaction(s): Other (See Comments), Unknown, Unknown (comments)  Pt does not remember  Pt does not remember  Pt does not remember    Unknown reaction. Other reaction(s): Other (See Comments), Unknown, Unknown (comments) Pt does not remember    Other reaction(s): Other (See Comments), Other (See Comments), unknown, Unknown (comments) Unknown reaction. Other reaction(s): Other (See Comments), Unknown, Unknown (comments) Pt does not remember Pt does not remember   Gabapentin Nausea And Vomiting, Other (See Comments) and Shortness Of Breath    Drowsiness.  Like a zombie, not functional  Other reaction(s): Other (See Comments), Other (See Comments), Unknown, Unknown (comments)  Drowsiness.  Like a zombie, not functional  Drowsiness, lethargy  Drowsiness, lethargy    Drowsiness. Like a zombie, not functional    Other reaction(s): Other (See Comments), Other (See Comments), Unknown, Unknown (comments) Drowsiness. Like a zombie,  not functional Drowsiness, lethargy  Drowsiness, lethargy   Shellfish Allergy Anaphylaxis    Other reaction(s): anaphylaxis/angioedema  Betadine on skin is okay  Other reaction(s): anaphylaxis/angioedema Other reaction(s): anaphylaxis/angioedema  Betadine on skin is okay   Strawberry Extract Hives, Nausea And Vomiting, Nausea Only and Rash    Severe vomitting  Other Reaction(s): Skin Rashes, Hives   Tape Dermatitis    Patient stated Tegaderm after surgery as well as a clear tape tear off her top skin layer and leave her raw. Was told to notify team allergy to tape.    Tramadol Other (See Comments)    blacked out, found wandering in the street  Other Reaction(s): Not available, Other  Other Reaction(s): neurological reaction    confusion    Altered mental status, confusion    CONFUSION    blacked out, found wandering in the street  Other Reaction(s): neurological reaction, Not available, Other (See Comments)    blacked out, found wandering in the street    Altered mental status, confusion    CONFUSION    confusion    Other Reaction(s): neurological reaction  confusion  Altered mental status, confusion  CONFUSION  blacked out, found wandering in the street  confusion    Altered mental status, confusion    blacked out, found wandering in the street  Other Reaction(s): Not available, Other  Other Reaction(s): neurological reaction  confusion  Altered mental status, confusion  CONFUSION  blacked out, found wandering in the street  Other Reaction(s): neurological reaction, Not available, Other (See Comments)  blacked out, found wandering in the street  Altered mental status, confusion  CONFUSION  confusion  Other Reaction(s): neurological reaction  confusion  Altered mental status, confusion  CONFUSION  blacked out, found wandering in the street    Other Reaction(s): neurological reaction  confusion  Altered mental status, confusion  CONFUSION  blacked  out, found wandering in the street   Ibuprofen Other (See Comments)    Blood disorder, GI bleed.  Other reaction(s): gi distress, Other (comments), Other (See Comments)  Other Reaction(s): GI bleeding, Not available, Other  GI Bleeding    GI BLEED    Blood disorder, GI bleed. Other reaction(s): gi distress, Other (comments), Other (See Comments)  Other Reaction(s): Not available, Other (See Comments)    Blood disorder, GI bleed. Other reaction(s): gi distress, Other (comments), Other (See Comments)    GI BLEED    GI Bleeding    GI Bleeding  GI BLEED  Blood disorder, GI bleed.  Other reaction(s): gi distress, Other (comments), Other (See Comments)   Montelukast Other (See Comments)    Syncope..passed out  Other Reaction(s): Other  Caused Fainting    Passed out    Syncope..passed out  Other Reaction(s): Not available, other/intolerance, Syncope    Syncope..passed out    Passed out    Caused Fainting    Caused Fainting  Passed out  Syncope..passed out  Caused Fainting    Syncope..passed out  Other Reaction(s): Other  Caused Fainting  Passed out  Syncope..passed out  Other Reaction(s): Not available, other/intolerance, Syncope  Syncope..passed out  Passed out  Caused Fainting  Caused Fainting  Passed out  Syncope..passed out    Caused Fainting  Passed out  Syncope..passed out   Naproxen Other (See Comments)    GI bleed  Other reaction(s): Other (comments), Other (See Comments)  Other Reaction(s): GI Bleed, GI bleeding, Not available, Other  GI Bleeding    GI BLEED    GI bleed Other reaction(s): Other (comments), Other (See Comments)  Other Reaction(s): Other (See Comments)    GI bleed Other reaction(s): Other (comments), Other (See Comments)    GI BLEED    GI Bleeding    GI Bleeding  GI BLEED  GI bleed Other reaction(s): Other (comments), Other (See Comments)   Prednisone  Other (See Comments)    Tremors, severe  Other Reaction(s):  Other (See Comments)  Other reaction(s): Other (See Comments), Other (See Comments)  Tremors, severe  Tremors and mental fogginess with oral prednisone , tolerates topical and inhaled  Tremors and mental fogginess with oral prednisone , tolerates topical and inhaled    Tremors, severe    Other reaction(s): Other (See Comments), Other (See Comments) Tremors, severe Tremors and mental fogginess with oral prednisone , tolerates topical and inhaled  Tremors and mental fogginess with oral prednisone , tolerates topical and inhaled    Tremors, severe  Other Reaction(s): Other (See Comments)  Other reaction(s): Other (See Comments), Other (See Comments) Tremors, severe Tremors and mental fogginess with oral prednisone , tolerates topical and inhaled  Tremors and mental fogginess with oral prednisone , tolerates topical and inhaled  Tremors, severe  Other reaction(s): Other (See Comments), Other (See Comments) Tremors, severe Tremors and mental fogginess with oral prednisone , tolerates topical and inhaled    Tremors and mental fogginess with oral prednisone , tolerates topical and inhaled  Tremors, severe  Other reaction(s): Other (See Comments), Other (See Comments) Tremors, severe Tremors and mental fogginess with oral prednisone , tolerates topical and inhaled   Ascorbate Hives    HIVES / VOMITING   Methotrexate And Trimetrexate Nausea And Vomiting   Sesame Oil     Other Reaction(s): Sesame Seed   Soy Allergy (Obsolete)     Other Reaction(s): Soybean   Egg Protein-Containing Drug Products Other (See Comments) and Rash    rash  Other reaction(s): Other (See Comments)  rash  rash    Other reaction(s): Other (See Comments) rash   Latex Itching and Rash    Other reaction(s): mild rash/itching  Other Reaction(s): Not available, Skin Rashes, Hives  Other reaction(s): rash/itching   Levothyroxine  Rash   Metronidazole Nausea Only    Fatigue  Pt states she gets extremely nauseas and  weak.   Nsaids Nausea And Vomiting and Other (See Comments)    Gastric bleed  Other Reaction(s): GI Bleed, Other  Other reaction(s): Other (See Comments), other/intolerance  Gastric bleed  Gastric bleed  Gastric bleed  Gastric bleed  Other Reaction(s): GI Bleed, Other  Other reaction(s): Other (  See Comments), other/intolerance Gastric bleed Gastric bleed Gastric bleed   Strawberry Flavoring Agent (Non-Screening) Hives   Sulfamethoxazole-Trimethoprim Other (See Comments)    Other reaction(s): Other (See Comments), other/intolerance  MAKES PAIN WORSE  Increases pain  Other Reaction(s): Other (See Comments)  Pt does not remember  Increases pain    Pt does not remember    MAKES PAIN WORSE    Other reaction(s): Other (See Comments), other/intolerance MAKES PAIN WORSE  Increases pain Increases pain  Increases pain    Pt does not remember    Other reaction(s): Other (See Comments), other/intolerance  MAKES PAIN WORSE  Increases pain  Other Reaction(s): Other (See Comments)  Pt does not remember  Increases pain  Pt does not remember  MAKES PAIN WORSE  Other reaction(s): Other (See Comments), other/intolerance MAKES PAIN WORSE  Increases pain Increases pain    Increases pain  Pt does not remember  MAKES PAIN WORSE  Other reaction(s): Other (See Comments), other/intolerance MAKES PAIN WORSE  Increases pain Increases pain  "

## 2024-02-10 NOTE — Telephone Encounter (Signed)
 Pt informed & came in for appt on Wednesday

## 2024-02-10 NOTE — Telephone Encounter (Signed)
 Called pt. Line cut out.
